# Patient Record
Sex: Female | Born: 1960 | Race: Black or African American | Hispanic: No | Marital: Married | Smoking: Never smoker
Health system: Southern US, Community
[De-identification: ages and names within clinical notes are randomized; demographics above are authoritative.]

## PROBLEM LIST (undated history)

## (undated) ENCOUNTER — Ambulatory Visit: Admission: EM | Payer: Managed Care, Other (non HMO) | Source: Home / Self Care

## (undated) DIAGNOSIS — I10 Essential (primary) hypertension: Secondary | ICD-10-CM

## (undated) DIAGNOSIS — M5430 Sciatica, unspecified side: Secondary | ICD-10-CM

## (undated) DIAGNOSIS — E559 Vitamin D deficiency, unspecified: Secondary | ICD-10-CM

## (undated) DIAGNOSIS — R768 Other specified abnormal immunological findings in serum: Secondary | ICD-10-CM

## (undated) DIAGNOSIS — D219 Benign neoplasm of connective and other soft tissue, unspecified: Secondary | ICD-10-CM

## (undated) DIAGNOSIS — R7689 Other specified abnormal immunological findings in serum: Secondary | ICD-10-CM

## (undated) DIAGNOSIS — M1990 Unspecified osteoarthritis, unspecified site: Secondary | ICD-10-CM

## (undated) DIAGNOSIS — E785 Hyperlipidemia, unspecified: Secondary | ICD-10-CM

## (undated) DIAGNOSIS — S82002A Unspecified fracture of left patella, initial encounter for closed fracture: Secondary | ICD-10-CM

## (undated) HISTORY — DX: Unspecified fracture of left patella, initial encounter for closed fracture: S82.002A

## (undated) HISTORY — DX: Sciatica, unspecified side: M54.30

## (undated) HISTORY — DX: Unspecified osteoarthritis, unspecified site: M19.90

## (undated) HISTORY — DX: Vitamin D deficiency, unspecified: E55.9

## (undated) HISTORY — DX: Other specified abnormal immunological findings in serum: R76.8

## (undated) HISTORY — DX: Other specified abnormal immunological findings in serum: R76.89

## (undated) HISTORY — DX: Hyperlipidemia, unspecified: E78.5

## (undated) HISTORY — DX: Benign neoplasm of connective and other soft tissue, unspecified: D21.9

## (undated) HISTORY — DX: Essential (primary) hypertension: I10

## (undated) HISTORY — PX: OTHER SURGICAL HISTORY: SHX169

---

## 1982-12-18 HISTORY — PX: TUBAL LIGATION: SHX77

## 2000-12-18 HISTORY — PX: ABDOMINAL HYSTERECTOMY: SHX81

## 2013-12-18 HISTORY — PX: CHOLECYSTECTOMY: SHX55

## 2014-07-21 ENCOUNTER — Ambulatory Visit: Payer: Self-pay | Admitting: Adult Health

## 2015-07-26 ENCOUNTER — Ambulatory Visit: Payer: Self-pay | Admitting: Cardiovascular Disease

## 2015-07-26 ENCOUNTER — Encounter: Payer: Self-pay | Admitting: *Deleted

## 2015-11-10 ENCOUNTER — Ambulatory Visit: Payer: Self-pay | Admitting: Primary Care

## 2015-11-17 ENCOUNTER — Ambulatory Visit: Payer: Self-pay | Admitting: Primary Care

## 2015-11-22 ENCOUNTER — Encounter: Payer: Self-pay | Admitting: Primary Care

## 2015-11-22 ENCOUNTER — Ambulatory Visit (INDEPENDENT_AMBULATORY_CARE_PROVIDER_SITE_OTHER): Payer: Managed Care, Other (non HMO) | Admitting: Primary Care

## 2015-11-22 VITALS — BP 126/82 | HR 66 | Temp 97.8°F | Ht 65.0 in | Wt 160.4 lb

## 2015-11-22 DIAGNOSIS — J209 Acute bronchitis, unspecified: Secondary | ICD-10-CM | POA: Diagnosis not present

## 2015-11-22 DIAGNOSIS — I10 Essential (primary) hypertension: Secondary | ICD-10-CM | POA: Diagnosis not present

## 2015-11-22 MED ORDER — DOXYCYCLINE HYCLATE 100 MG PO TABS: 100.0000 mg | ORAL_TABLET | Freq: Two times a day (BID) | ORAL | Status: DC

## 2015-11-22 NOTE — Assessment & Plan Note (Signed)
Diagnosed years ago and currently managed on lisinopril 5 mg. Cough today which is likely due to bacterial process. Will continue to monitor. BP stable in clinic today. Will obtain records for BMP.

## 2015-11-22 NOTE — Progress Notes (Signed)
Pre visit review using our clinic review tool, if applicable. No additional management support is needed unless otherwise documented below in the visit note. 

## 2015-11-22 NOTE — Patient Instructions (Signed)
Start Doxycycline antibiotics. Take 1 tablet by mouth twice daily for 10 days.  Continue cough medication, Mucinex. Rest and ensure you are staying hydrated.  Please call me if you've not noticed any improvement in 5 days.  Please schedule a physical with me after the new year at your convenience. You will also schedule a lab only appointment one week prior. We will discuss your lab results during your physical.  It was a pleasure to meet you today! Please don't hesitate to call me with any questions. Welcome to [facility]!

## 2015-11-22 NOTE — Progress Notes (Signed)
Subjective:    Patient ID: Samantha Hall, female    DOB: 13-Jan-1961, 54 y.o.   MRN: [id number]  HPI  [name] is a 54 year old female who presents today to establish care and discuss the problems mentioned below. Will obtain old records. Her last physical was in late winter/ early spring of 2016.  1) Cough: Present for the past 2 weeks with sore throat, fatigue, and voice hoarseness. She visited the employee clinic Tuesday last week and was provided with cough syrup with codeine. She was then started on prednisone taper on Thursday last week as she had no improvement. She's been taking Mucinex DM and Delsym with temporary relief. She continues to experience congestion and hoarseness to her voice with fatigue. Overall she's feeling worse.  2) Essential Hypertension: Diagnosed 7-8 years ago. Currently managed on lisinopril 5 mg. Denies headaches, chest pain. She does have a cough with other symptoms of fatigue, voice hoarseness.   3) Elevated ANA: History of elevated ANA in the past. Negative work up for Lupus and other autoimmune diseases in the past.   Review of Systems  Constitutional: Positive for fatigue. Negative for fever and chills.  HENT: Positive for congestion, rhinorrhea, sore throat and voice change. Negative for ear pain.   Respiratory: Positive for cough. Negative for shortness of breath.   Cardiovascular: Negative for chest pain.  Gastrointestinal: Negative for nausea and vomiting.  Genitourinary:       Hysterectomy  Musculoskeletal: Negative for myalgias and arthralgias.  Allergic/Immunologic: Negative for environmental allergies.  Neurological: Negative for dizziness and headaches.       Occasional sciatic nerve pain to right side.  Hematological: Negative for adenopathy.       Past Medical History  Diagnosis Date  . Hypertension   . Sciatica     Social History   Social History  . Marital Status: Married    Spouse Name: N/A  . Number of Children: N/A    . Years of Education: N/A   Occupational History  . Not on file.   Social History Main Topics  . Smoking status: Never Smoker   . Smokeless tobacco: Not on file  . Alcohol Use: 0.0 oz/week    0 Standard drinks or equivalent per week     Comment: sociall  . Drug Use: Not on file  . Sexual Activity: Not on file   Other Topics Concern  . Not on file   Social History Narrative   Married.   Highest level of education Bachelors.    Works as a Education officer, museum.    Past Surgical History  Procedure Laterality Date  . Cholecystectomy  2015  . Abdominal hysterectomy  2002  . Tubal ligation  1984    No family history on file.  Allergies  Allergen Reactions  . Cefazolin Swelling    Difficulty breathing    No current outpatient prescriptions on file prior to visit.   No current facility-administered medications on file prior to visit.    BP 126/82 mmHg  Pulse 66  Temp(Src) 97.8 F (36.6 C) (Oral)  Ht 5\' 5"  (1.651 m)  Wt 160 lb 6.4 oz (72.757 kg)  BMI 26.69 kg/m2  SpO2 97%    Objective:   Physical Exam  Constitutional: She is oriented to person, place, and time. She appears well-nourished.  HENT:  Right Ear: Tympanic membrane and ear canal normal.  Left Ear: Tympanic membrane and ear canal normal.  Nose: Right sinus exhibits no maxillary sinus tenderness  and no frontal sinus tenderness. Left sinus exhibits no maxillary sinus tenderness and no frontal sinus tenderness.  Mouth/Throat: Posterior oropharyngeal erythema present. No oropharyngeal exudate or posterior oropharyngeal edema.  Eyes: Conjunctivae are normal. Pupils are equal, round, and reactive to light.  Neck: Neck supple.  Cardiovascular: Normal rate and regular rhythm.   Pulmonary/Chest: Effort normal and breath sounds normal. She has no rales.  Dry, cough present on exam.  Lymphadenopathy:    She has no cervical adenopathy.  Neurological: She is alert and oriented to person, place, and time.  Skin: Skin  is warm and dry.  Psychiatric: She has a normal mood and affect.          Assessment & Plan:  Cough:  Sore throat, fatigue, chest congestion x 2 weeks. Temporary improvement with OTCs and codeine cough medication.  Recently treated with prednisone, no improvement. Suspect bacterial involvement at this point and will treat with antibiotics. Start doxycycline course. Fluids, rest, Delsym. If no improvement will consider switching from ACE.

## 2015-11-23 ENCOUNTER — Encounter: Payer: Self-pay | Admitting: Primary Care

## 2015-11-29 ENCOUNTER — Encounter: Payer: Self-pay | Admitting: Primary Care

## 2015-12-30 ENCOUNTER — Telehealth: Payer: Self-pay | Admitting: Primary Care

## 2015-12-30 ENCOUNTER — Emergency Department
Admission: EM | Admit: 2015-12-30 | Discharge: 2015-12-30 | Disposition: A | Discharge status: Home / Self Care | Payer: Managed Care, Other (non HMO) | Attending: Emergency Medicine | Admitting: Emergency Medicine

## 2015-12-30 ENCOUNTER — Encounter: Payer: Self-pay | Admitting: *Deleted

## 2015-12-30 DIAGNOSIS — Z792 Long term (current) use of antibiotics: Secondary | ICD-10-CM | POA: Diagnosis not present

## 2015-12-30 DIAGNOSIS — R1011 Right upper quadrant pain: Secondary | ICD-10-CM | POA: Diagnosis present

## 2015-12-30 DIAGNOSIS — K2980 Duodenitis without bleeding: Secondary | ICD-10-CM | POA: Diagnosis not present

## 2015-12-30 DIAGNOSIS — K2970 Gastritis, unspecified, without bleeding: Secondary | ICD-10-CM | POA: Insufficient documentation

## 2015-12-30 DIAGNOSIS — Z79899 Other long term (current) drug therapy: Secondary | ICD-10-CM | POA: Diagnosis not present

## 2015-12-30 DIAGNOSIS — I10 Essential (primary) hypertension: Secondary | ICD-10-CM | POA: Diagnosis not present

## 2015-12-30 DIAGNOSIS — K2990 Gastroduodenitis, unspecified, without bleeding: Secondary | ICD-10-CM

## 2015-12-30 LAB — URINALYSIS COMPLETE WITH MICROSCOPIC (ARMC ONLY)
Bilirubin Urine: NEGATIVE
Glucose, UA: NEGATIVE mg/dL
Hgb urine dipstick: NEGATIVE
Ketones, ur: NEGATIVE mg/dL
Leukocytes, UA: NEGATIVE
Nitrite: NEGATIVE
Protein, ur: NEGATIVE mg/dL
Specific Gravity, Urine: 1.006 (ref 1.005–1.030)
pH: 7 (ref 5.0–8.0)

## 2015-12-30 LAB — COMPREHENSIVE METABOLIC PANEL
ALT: 34 U/L (ref 14–54)
AST: 30 U/L (ref 15–41)
Albumin: 4 g/dL (ref 3.5–5.0)
Alkaline Phosphatase: 59 U/L (ref 38–126)
Anion gap: 6 (ref 5–15)
BUN: 13 mg/dL (ref 6–20)
CO2: 25 mmol/L (ref 22–32)
Calcium: 8.8 mg/dL — ABNORMAL LOW (ref 8.9–10.3)
Chloride: 106 mmol/L (ref 101–111)
Creatinine, Ser: 0.8 mg/dL (ref 0.44–1.00)
GFR calc Af Amer: >60 mL/min (ref >60)
GFR calc non Af Amer: >60 mL/min (ref >60)
Glucose, Bld: 95 mg/dL (ref 65–99)
Potassium: 3.9 mmol/L (ref 3.5–5.1)
Sodium: 137 mmol/L (ref 135–145)
Total Bilirubin: 0.8 mg/dL (ref 0.3–1.2)
Total Protein: 7.3 g/dL (ref 6.5–8.1)

## 2015-12-30 LAB — LIPASE, BLOOD: Lipase: 23 U/L (ref 11–51)

## 2015-12-30 LAB — CBC
HCT: 42.7 % (ref 35.0–47.0)
Hemoglobin: 14.4 g/dL (ref 12.0–16.0)
MCH: 30.8 pg (ref 26.0–34.0)
MCHC: 33.7 g/dL (ref 32.0–36.0)
MCV: 91.4 fL (ref 80.0–100.0)
Platelets: 219 10*3/uL (ref 150–440)
RBC: 4.67 MIL/uL (ref 3.80–5.20)
RDW: 13.1 % (ref 11.5–14.5)
WBC: 7.8 10*3/uL (ref 3.6–11.0)

## 2015-12-30 MED ORDER — DICYCLOMINE HCL 20 MG PO TABS: 20.0000 mg | ORAL_TABLET | Freq: Three times a day (TID) | ORAL | Status: DC | PRN

## 2015-12-30 MED ORDER — GI COCKTAIL ~~LOC~~
ORAL | Status: AC
  Administered 2015-12-30: 30 mL via ORAL
  Filled 2015-12-30: qty 30

## 2015-12-30 MED ORDER — GI COCKTAIL ~~LOC~~
30.0000 mL | Freq: Once | ORAL | Status: AC
  Administered 2015-12-30: 30 mL via ORAL
  Filled 2015-12-30: qty 30

## 2015-12-30 MED ORDER — PANTOPRAZOLE SODIUM 20 MG PO TBEC: 20.0000 mg | DELAYED_RELEASE_TABLET | Freq: Every day | ORAL | Status: DC

## 2015-12-30 MED ORDER — ONDANSETRON HCL 4 MG PO TABS: 4.0000 mg | ORAL_TABLET | Freq: Three times a day (TID) | ORAL | Status: DC | PRN

## 2015-12-30 NOTE — ED Notes (Signed)
States umbilical pain that started last night, states nausea, states lower back pain, denies any painful urination, states she has not eaten today

## 2015-12-30 NOTE — ED Provider Notes (Signed)
Time Seen: Approximately 1500 I have reviewed the triage notes  Chief Complaint: Abdominal Pain   History of Present Illness: Samantha Hall is a 55 y.o. female *who states that she had some pain in the abdomen that started yesterday and points primarily to the epigastric area she states the pain seems to radiate to the right upper quadrant and occasionally through to her back and denies any melena or hematochezia. She denies any fever at home. She did not take any medications prior to arrival. Patient she had 2 bowel movements today, one was normal and the other one was somewhat loose and watery. She does describe nausea without any persistent vomiting.   Past Medical History  Diagnosis Date  . Hypertension   . Sciatica   . Elevated antinuclear antibody (ANA) level     Negative workup for Lupus.    Patient Active Problem List   Diagnosis Date Noted  . Essential hypertension 11/22/2015    Past Surgical History  Procedure Laterality Date  . Cholecystectomy  2015  . Abdominal hysterectomy  2002  . Tubal ligation  1984    Past Surgical History  Procedure Laterality Date  . Cholecystectomy  2015  . Abdominal hysterectomy  2002  . Tubal ligation  1984    Current Outpatient Rx  Name  Route  Sig  Dispense  Refill  . dicyclomine (BENTYL) 20 MG tablet   Oral   Take 1 tablet (20 mg total) by mouth 3 (three) times daily as needed for spasms.   30 tablet   0   . doxycycline (VIBRA-TABS) 100 MG tablet   Oral   Take 1 tablet (100 mg total) by mouth 2 (two) times daily.   20 tablet   0   . lisinopril (PRINIVIL,ZESTRIL) 5 MG tablet   Oral   Take 5 mg by mouth daily.         . ondansetron (ZOFRAN) 4 MG tablet   Oral   Take 1 tablet (4 mg total) by mouth every 8 (eight) hours as needed for nausea or vomiting.   21 tablet   0   . pantoprazole (PROTONIX) 20 MG tablet   Oral   Take 1 tablet (20 mg total) by mouth daily.   30 tablet   1     Allergies:  Cefazolin  and Sulfa antibiotics  Family History: History reviewed. No pertinent family history.  Social History: Social History  Substance Use Topics  . Smoking status: Never Smoker   . Smokeless tobacco: None  . Alcohol Use: 0.0 oz/week    0 Standard drinks or equivalent per week     Comment: sociall     Review of Systems:   10 point review of systems was performed and was otherwise negative:  Constitutional: No fever Eyes: No visual disturbances ENT: No sore throat, ear pain Cardiac: No chest pain Respiratory: No shortness of breath, wheezing, or stridor Abdomen: Patient points primarily to the epigastric area Endocrine: No weight loss, No night sweats Extremities: No peripheral edema, cyanosis Skin: No rashes, easy bruising Neurologic: No focal weakness, trouble with speech or swollowing Urologic: No dysuria, Hematuria, or urinary frequency   Physical Exam:  ED Triage Vitals  Enc Vitals Group     BP 12/30/15 1233 163/83 mmHg     Pulse Rate 12/30/15 1233 66     Resp 12/30/15 1233 16     Temp 12/30/15 1233 98.6 F (37 C)     Temp Source 12/30/15 1233 Oral  SpO2 12/30/15 1233 97 %     Weight 12/30/15 1233 150 lb (68.04 kg)     Height 12/30/15 1233 5\' 5"  (1.651 m)     Head Cir --      Peak Flow --      Pain Score 12/30/15 1238 4     Pain Loc --      Pain Edu? --      Excl. in [place]? --     General: Awake , Alert , and Oriented times 3; GCS 15 Head: Normal cephalic , atraumatic Eyes: Pupils equal , round, reactive to light Nose/Throat: No nasal drainage, patent upper airway without erythema or exudate.  Neck: Supple, Full range of motion, No anterior adenopathy or palpable thyroid masses Lungs: Clear to ascultation without wheezes , rhonchi, or rales Heart: Regular rate, regular rhythm without murmurs , gallops , or rubs Abdomen: Patient's tender to deep palpation in the epigastric area without rebound, guarding , or rigidity; bowel sounds positive and symmetric in  all 4 quadrants. No organomegaly .      No reproducible lower abdominal pain. Negative tenderness over McBurney's point  Extremities: 2 plus symmetric pulses. No edema, clubbing or cyanosis Neurologic: normal ambulation, Motor symmetric without deficits, sensory intact Skin: warm, dry, no rashes   Labs:   All laboratory work was reviewed including any pertinent negatives or positives listed below:  [place] - Abnormal; Notable for the following:    Calcium 8.8 (*)    All other components within normal limits  URINALYSIS COMPLETEWITH MICROSCOPIC ([facility] ONLY) - Abnormal; Notable for the following:    Color, Urine STRAW (*)    APPearance CLEAR (*)    Bacteria, UA FEW (*)    Squamous Epithelial / LPF 0-5 (*)    All other components within normal limits  LIPASE, BLOOD  CBC   laboratory work showed no significant findings  EKG: ED ECG REPORT I, Daymon Larsen, the attending physician, personally viewed and interpreted this ECG.  Date: 12/30/2015 EKG Time: *1245* Rate: 64 Rhythm: normal sinus rhythm QRS Axis: normal Intervals: normal ST/T Wave abnormalities: Nonspecific T wave abnormality Conduction Disutrbances: none Narrative Interpretation: unremarkable No acute ischemic changes      ED Course:  Patient was given a GI cocktail with symptomatic improvement.Differential diagnosis includes but is not exclusive to acute cholecystitis, intrathoracic causes for epigastric abdominal pain, gastritis, duodenitis, pancreatitis, small bowel or large bowel obstruction, abdominal aortic aneurysm, hernia, gastritis, etc.   Given the patient's current clinical presentation and objective findings I felt most likely this was gastritis or duodenitis. Patient goes on to state that she does have a history of hiatal hernia. She's had a previous cholecystectomy but I felt this was unlikely to be a bowel obstruction or surgical issue at this  time. Assessment: Acute gastritis   Final Clinical Impression  Final diagnoses:  Gastritis and duodenitis     Plan:  Outpatient management Patient was advised to return immediately if condition worsens. Patient was advised to follow up with their primary care physician or other specialized physicians involved in their outpatient care Patient was discharged on proton next, Bentyl, and Zofran.            Daymon Larsen, MD 12/30/15 [phone]

## 2015-12-30 NOTE — Telephone Encounter (Signed)
PLEASE NOTE: All timestamps contained within this report are represented as [place] Standard Time. CONFIDENTIALTY NOTICE: This fax transmission is intended only for the addressee. It contains information that is legally privileged, confidential or otherwise protected from use or disclosure. If you are not the intended recipient, you are strictly prohibited from reviewing, disclosing, copying using or disseminating any of this information or taking any action in reliance on or regarding this information. If you have received this fax in error, please notify us immediately by telephone so that we can arrange for its return to [place]. Phone: [phone], Toll-Free: [phone], Fax: [phone] Page: 1 of 1 Call Id: [id number] [place] Patient Name: Samantha Hall DOB: Apr 28, 1961 Initial Comment caller states she has upper abd pain Nurse Assessment Nurse: Marcelline Deist, RN, Kermit Balo Date/Time (Eastern Time): 12/30/2015 10:26:10 AM Confirm and document reason for call. If symptomatic, describe symptoms. ---Caller states she has upper abdominal pain, goes from center to the right side. It started last night. Feels cold, not sure if fever. Does not have gall bladder. Has the patient traveled out of the country within the last 30 days? ---Not Applicable Does the patient have any new or worsening symptoms? ---Yes Will a triage be completed? ---Yes Related visit to physician within the last 2 weeks? ---No Does the PT have any chronic conditions? (i.e. diabetes, asthma, etc.) ---Yes List chronic conditions. --- takes Lisinopril Is this a behavioral health or substance abuse call? ---No Guidelines Guideline Title Affirmed Question Affirmed Notes Abdominal Pain - Upper [1] Pain lasts > 10 minutes AND [2] age > 79 Final Disposition User Go to ED Now Marcelline Deist, RN, Lynda Comments Caller states she can get someone  to take her to the hospital. The pain comes and goes in intensity, states it can get sharp, too. Rates it a 4 or 5 on pain scale. Referrals [facility] - ED Disagree/Comply: Comply

## 2015-12-30 NOTE — Discharge Instructions (Signed)
Gastritis, Adult Gastritis is soreness and swelling (inflammation) of the lining of the stomach. Gastritis can develop as a sudden onset (acute) or long-term (chronic) condition. If gastritis is not treated, it can lead to stomach bleeding and ulcers. CAUSES  Gastritis occurs when the stomach lining is weak or damaged. Digestive juices from the stomach then inflame the weakened stomach lining. The stomach lining may be weak or damaged due to viral or bacterial infections. One common bacterial infection is the Helicobacter pylori infection. Gastritis can also result from excessive alcohol consumption, taking certain medicines, or having too much acid in the stomach.  SYMPTOMS  In some cases, there are no symptoms. When symptoms are present, they may include:  Pain or a burning sensation in the upper abdomen.  Nausea.  Vomiting.  An uncomfortable feeling of fullness after eating. DIAGNOSIS  Your caregiver may suspect you have gastritis based on your symptoms and a physical exam. To determine the cause of your gastritis, your caregiver may perform the following:  Blood or stool tests to check for the H pylori bacterium.  Gastroscopy. A thin, flexible tube (endoscope) is passed down the esophagus and into the stomach. The endoscope has a light and camera on the end. Your caregiver uses the endoscope to view the inside of the stomach.  Taking a tissue sample (biopsy) from the stomach to examine under a microscope. TREATMENT  Depending on the cause of your gastritis, medicines may be prescribed. If you have a bacterial infection, such as an H pylori infection, antibiotics may be given. If your gastritis is caused by too much acid in the stomach, H2 blockers or antacids may be given. Your caregiver may recommend that you stop taking aspirin, ibuprofen, or other nonsteroidal anti-inflammatory drugs (NSAIDs). HOME CARE INSTRUCTIONS  Only take over-the-counter or prescription medicines as directed by  your caregiver.  If you were given antibiotic medicines, take them as directed. Finish them even if you start to feel better.  Drink enough fluids to keep your urine clear or pale yellow.  Avoid foods and drinks that make your symptoms worse, such as:  Caffeine or alcoholic drinks.  Chocolate.  Peppermint or mint flavorings.  Garlic and onions.  Spicy foods.  Citrus fruits, such as oranges, lemons, or limes.  Tomato-based foods such as sauce, chili, salsa, and pizza.  Fried and fatty foods.  Eat small, frequent meals instead of large meals. SEEK IMMEDIATE MEDICAL CARE IF:   You have black or dark red stools.  You vomit blood or material that looks like coffee grounds.  You are unable to keep fluids down.  Your abdominal pain gets worse.  You have a fever.  You do not feel better after 1 week.  You have any other questions or concerns. MAKE SURE YOU:  Understand these instructions.  Will watch your condition.  Will get help right away if you are not doing well or get worse.   This information is not intended to replace advice given to you by your health care provider. Make sure you discuss any questions you have with your health care provider.   Document Released: 11/28/2001 Document Revised: 06/04/2012 Document Reviewed: 01/17/2012 Elsevier Interactive Patient Education [employer].  Please return immediately if condition worsens. Please contact her primary physician or the physician you were given for referral. If you have any specialist physicians involved in her treatment and plan please also contact them. Thank you for using [facility] [place] [facility]. Please return especially for fever, persistent  vomiting, shortness of breath, bloody stool, or any other new concerns.

## 2016-01-04 ENCOUNTER — Encounter: Payer: Self-pay | Admitting: Primary Care

## 2016-01-04 ENCOUNTER — Ambulatory Visit (INDEPENDENT_AMBULATORY_CARE_PROVIDER_SITE_OTHER): Payer: Managed Care, Other (non HMO) | Admitting: Primary Care

## 2016-01-04 VITALS — BP 124/82 | HR 54 | Temp 97.9°F | Ht 65.0 in | Wt 160.8 lb

## 2016-01-04 DIAGNOSIS — R1010 Upper abdominal pain, unspecified: Secondary | ICD-10-CM

## 2016-01-04 DIAGNOSIS — I10 Essential (primary) hypertension: Secondary | ICD-10-CM | POA: Diagnosis not present

## 2016-01-04 NOTE — Patient Instructions (Signed)
Hold the Lisinopril 5 mg for 2 weeks. Check your blood pressure daily, around the same time of day, for the next 2 weeks.   Ensure that you have rested for 30 minutes prior to checking your blood pressure. Record your readings and send them to me via My Chart.  Stop by the front desk and speak with either Rosaria Ferries or Ebony Hail regarding your Ultrasound. I will be in touch regarding your results.  Please e-mail me if your pain becomes worse between now and your Ultrasound.  It was a pleasure to see you today!

## 2016-01-04 NOTE — Progress Notes (Signed)
Pre visit review using our clinic review tool, if applicable. No additional management support is needed unless otherwise documented below in the visit note. 

## 2016-01-04 NOTE — Progress Notes (Signed)
Subjective:    Patient ID: Samantha Hall, female    DOB: 13-Jan-1961, 55 y.o.   MRN: [id number]  HPI  Samantha Hall is a 55 year old female who presents today for Emergency Department follow up. Recently evaluated in the ED for complaints of abdominal pain. Her pain was located to epigastric region with radiation to RUQ. She was suspected to have gastritis or duodenitis as she had a cholecystectomy and no evidence for bowel obstruction. She was provided with a GI cocktail with improvement. She was discharged home with a prescription for Protonix, Bentyl, and Zofran.  Since evaluation in the ED she continues to have some improvement overall but will experience discomfort. She's taking the Protonix daily. She's used the Zofran a few times. She's had 1 episode of nausea, no vomiting. Denies bloody stools, urinary symptoms, vaginal symptoms. She does feel "swollen" to the epigastric region.   2) Essential Hypertension: Currently managed on Lisinopril 5 mg. She's checking her BP at home very occasionally with normal readings. She will occasionally get a "tickle" to her throat with cough a coughing spell. She feels when she experiences this she cannot talk.  3) Left shoulder pain: Soreness to joint. Intermittently for the past several months. Doesn't require her to take any medications. Soreness is present with rest and movement.  Review of Systems  Constitutional: Negative for fever.  Respiratory: Negative for cough and shortness of breath.   Cardiovascular: Negative for chest pain.  Gastrointestinal: Positive for nausea and abdominal pain. Negative for vomiting, diarrhea, constipation and abdominal distention.  Genitourinary: Negative for dysuria and vaginal discharge.  Neurological: Negative for dizziness and headaches.       Past Medical History  Diagnosis Date  . Hypertension   . Sciatica   . Elevated antinuclear antibody (ANA) level     Negative workup for Lupus.    Social History     Social History  . Marital Status: Married    Spouse Name: N/A  . Number of Children: N/A  . Years of Education: N/A   Occupational History  . Not on file.   Social History Main Topics  . Smoking status: Never Smoker   . Smokeless tobacco: Not on file  . Alcohol Use: 0.0 oz/week    0 Standard drinks or equivalent per week     Comment: sociall  . Drug Use: Not on file  . Sexual Activity: Not on file   Other Topics Concern  . Not on file   Social History Narrative   Married.   Highest level of education Bachelors.    Works as a Education officer, museum.    Past Surgical History  Procedure Laterality Date  . Cholecystectomy  2015  . Abdominal hysterectomy  2002  . Tubal ligation  1984    No family history on file.  Allergies  Allergen Reactions  . Cefazolin Swelling    Difficulty breathing  . Sulfa Antibiotics     Current Outpatient Prescriptions on File Prior to Visit  Medication Sig Dispense Refill  . dicyclomine (BENTYL) 20 MG tablet Take 1 tablet (20 mg total) by mouth 3 (three) times daily as needed for spasms. 30 tablet 0  . lisinopril (PRINIVIL,ZESTRIL) 5 MG tablet Take 5 mg by mouth daily.    . ondansetron (ZOFRAN) 4 MG tablet Take 1 tablet (4 mg total) by mouth every 8 (eight) hours as needed for nausea or vomiting. 21 tablet 0  . pantoprazole (PROTONIX) 20 MG tablet Take 1 tablet (20 mg  total) by mouth daily. 30 tablet 1   No current facility-administered medications on file prior to visit.    BP 124/82 mmHg  Pulse 54  Temp(Src) 97.9 F (36.6 C) (Oral)  Ht 5\' 5"  (1.651 m)  Wt 160 lb 12.8 oz (72.938 kg)  BMI 26.76 kg/m2  SpO2 99%    Objective:   Physical Exam  Constitutional: She appears well-nourished.  Cardiovascular: Normal rate and regular rhythm.   Pulmonary/Chest: Effort normal and breath sounds normal.  Abdominal: Soft. Bowel sounds are normal. There is tenderness in the right upper quadrant and epigastric area.  Skin: Skin is warm and dry.           Assessment & Plan:  ED Follow up:  Epigastric and RUQ abdominal pain x several days. Continues despite treatment with bentyl, zofran, protonix. History of hiatal hernia and cholecystomy, tender to epigastric region on exam. Labs and notes from ED reviewed. Labs look good.  Will complete US to RUQ as she continued to experience discomfort.

## 2016-01-05 NOTE — Assessment & Plan Note (Signed)
Stable today. She's not had her medication this morning. Will hold as she's experiencing coughing spells. She is to send me home BP readings in 2 weeks. If elevated then will switch to HCTZ or Amlodipine.

## 2016-01-06 ENCOUNTER — Encounter: Payer: Self-pay | Admitting: Primary Care

## 2016-01-06 ENCOUNTER — Ambulatory Visit
Admission: RE | Admit: 2016-01-06 | Discharge: 2016-01-06 | Disposition: A | Discharge status: Home / Self Care | Payer: Managed Care, Other (non HMO) | Source: Ambulatory Visit | Attending: Primary Care | Admitting: Primary Care

## 2016-01-06 DIAGNOSIS — R1013 Epigastric pain: Secondary | ICD-10-CM | POA: Insufficient documentation

## 2016-01-06 DIAGNOSIS — Z9049 Acquired absence of other specified parts of digestive tract: Secondary | ICD-10-CM | POA: Diagnosis not present

## 2016-01-06 DIAGNOSIS — R1010 Upper abdominal pain, unspecified: Secondary | ICD-10-CM

## 2016-01-06 DIAGNOSIS — R1011 Right upper quadrant pain: Secondary | ICD-10-CM | POA: Diagnosis present

## 2016-01-18 ENCOUNTER — Telehealth: Payer: Self-pay | Admitting: Primary Care

## 2016-01-18 NOTE — Telephone Encounter (Signed)
Called and asked patient regarding her BP. Patient stated that she was not checking her BP as she was suppose to. She does take a lisinopril when she believes her elevated BP.  BP Reading  136/84   145/97 94/64 124/86 132/98 133/74 111/78 134/68 124/80 129/81 146/88 126/82 148/88

## 2016-01-18 NOTE — Telephone Encounter (Signed)
Will you check on Samantha Hall blood pressure? We stopped her lisinopril last visit due to cough. How are her readings?

## 2016-01-31 IMAGING — US US ABDOMEN LIMITED
1 series · 14 of 25 positions shown · non-contrast
Comparison: None.

CLINICAL DATA: Epigastric pain, right upper quadrant pain for 8
days

EXAM:
US ABDOMEN LIMITED - RIGHT UPPER QUADRANT

[Series 1: us abdomen limited · 0.22mm/px · 14 of 73 slices shown]
[im 1/73]
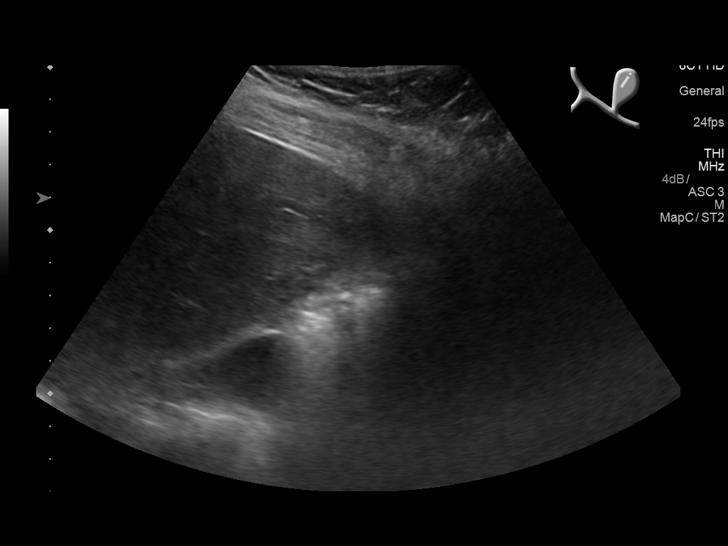
[im 7/73]
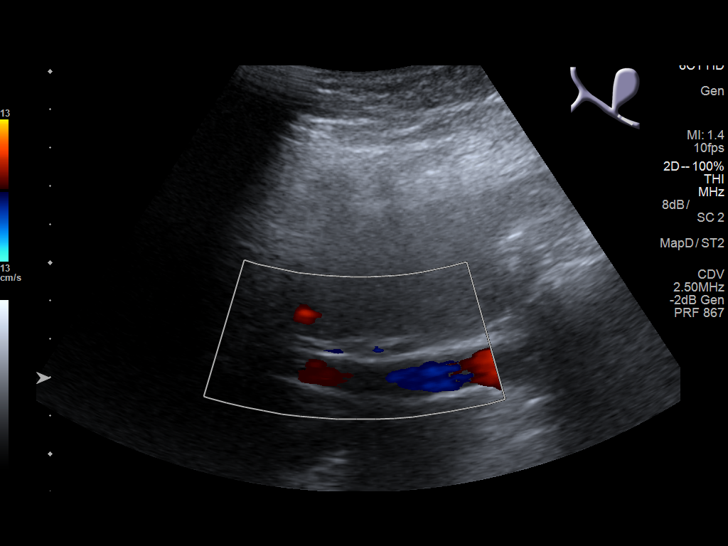
[im 13/73]
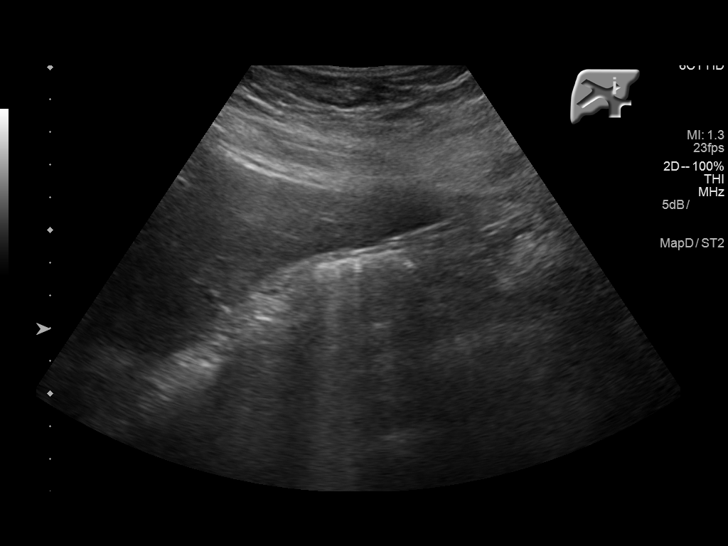
[im 19/73]
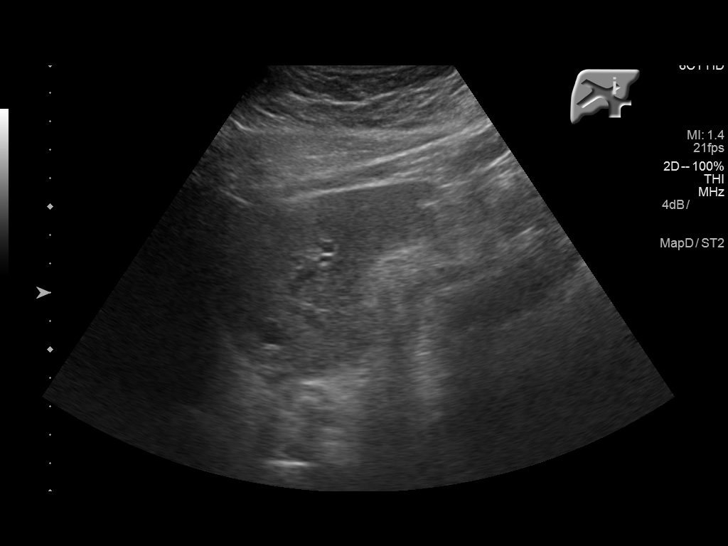
[im 25/73]
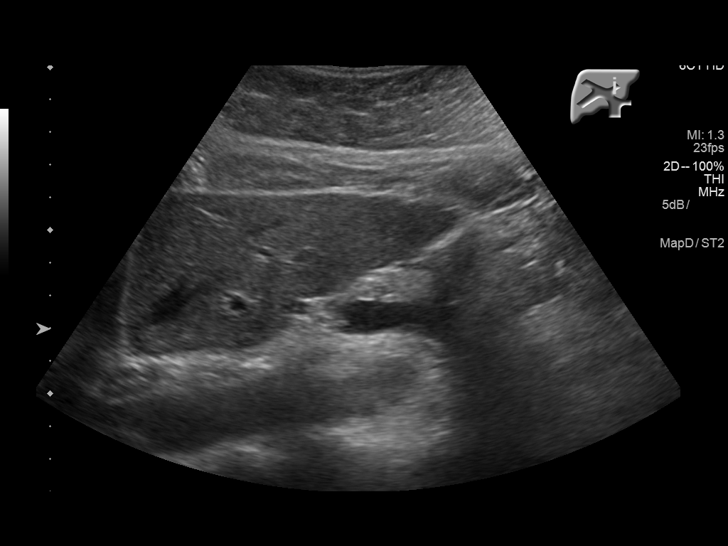
[im 28/73]
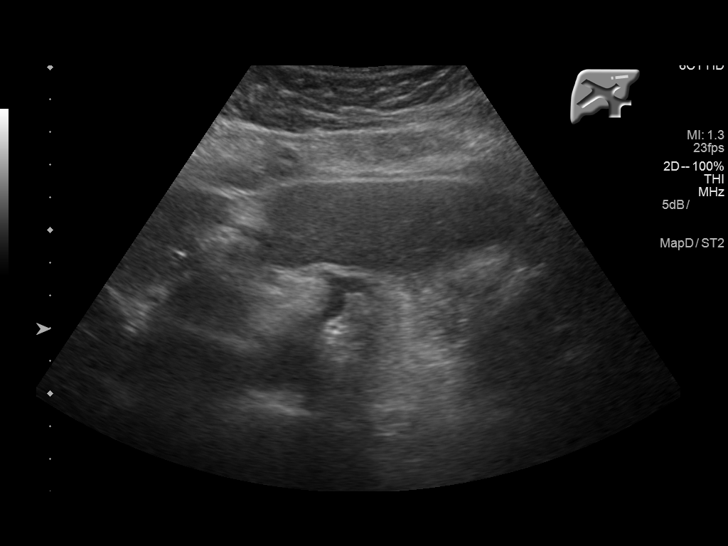
[im 34/73]
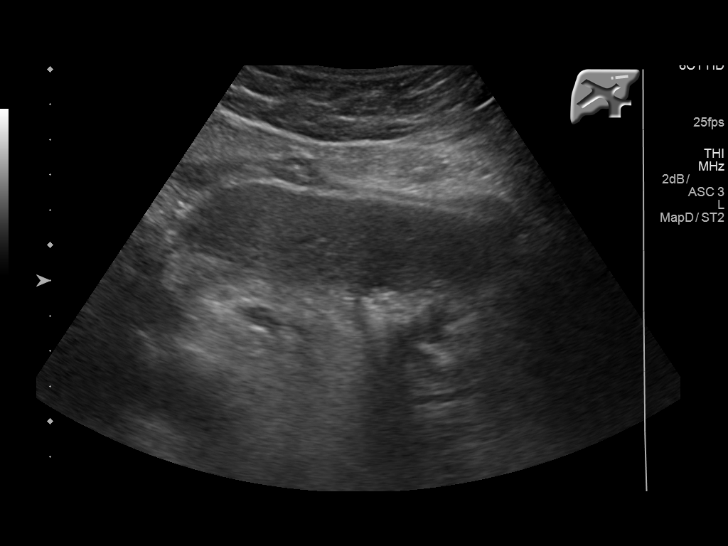
[im 40/73]
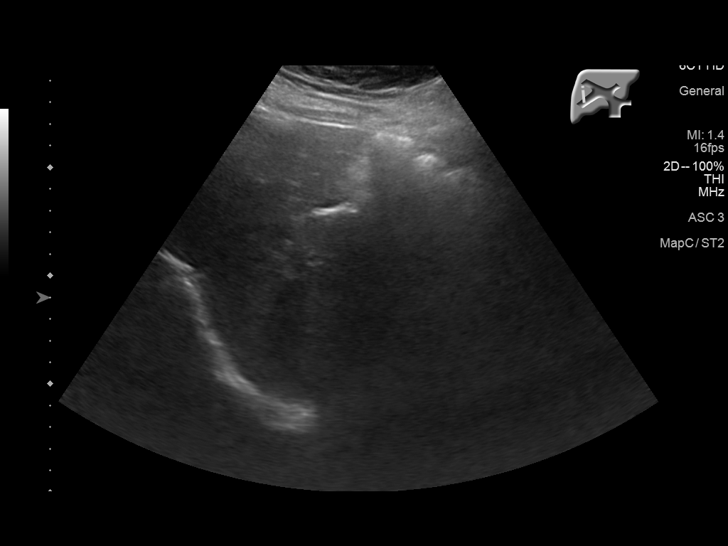
[im 46/73]
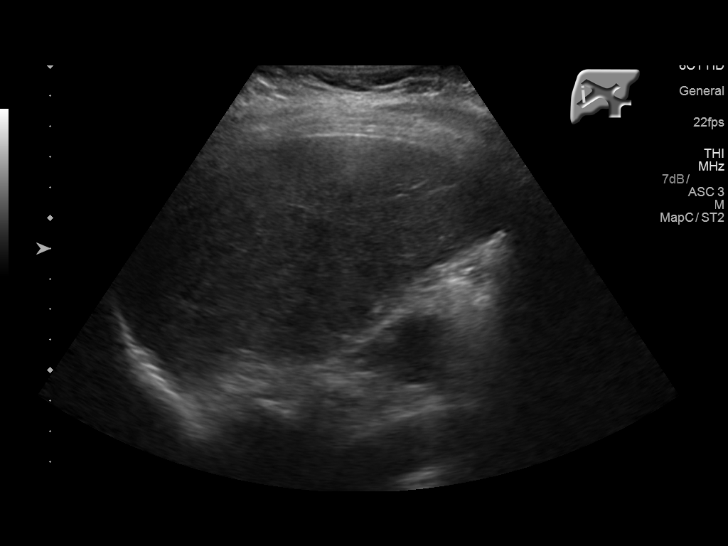
[im 49/73]
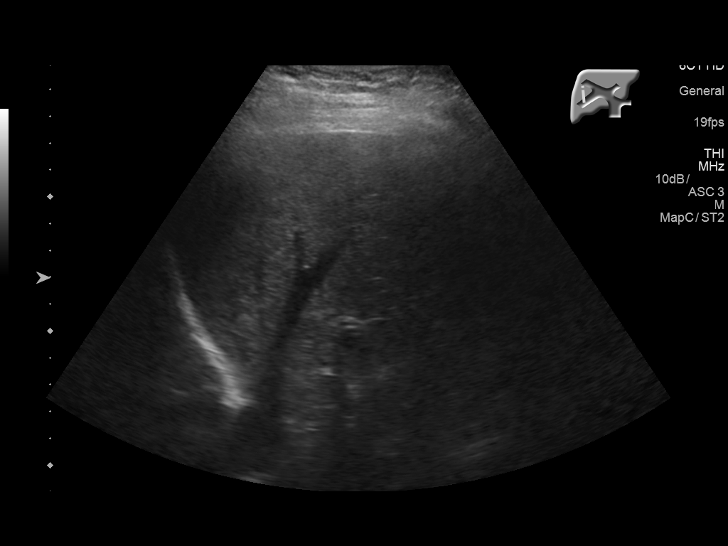
[im 55/73]
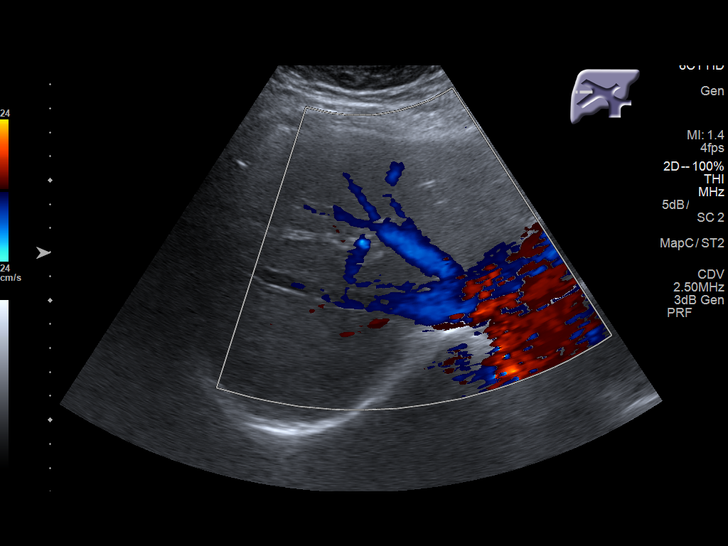
[im 61/73]
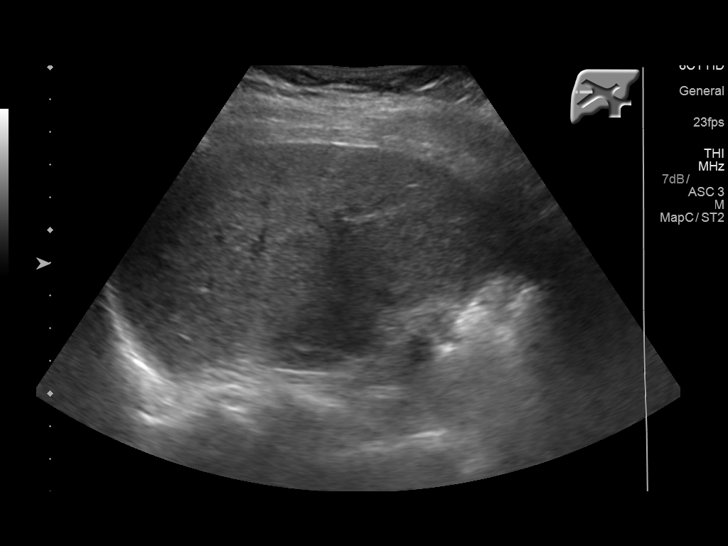
[im 67/73]
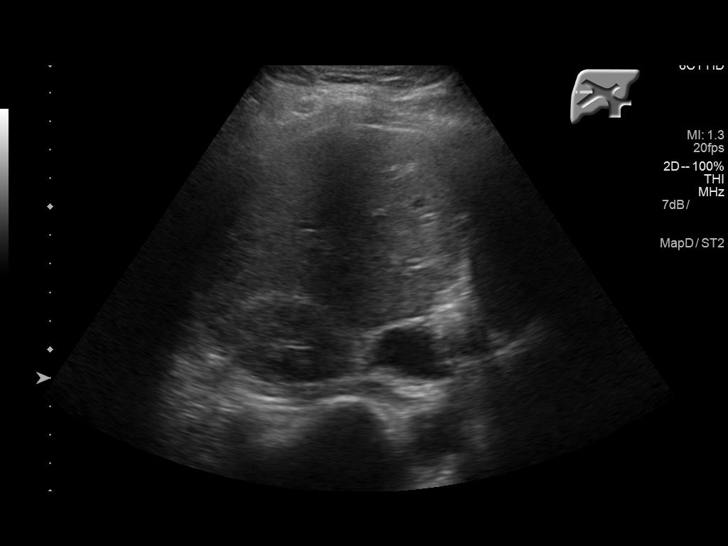
[im 73/73]
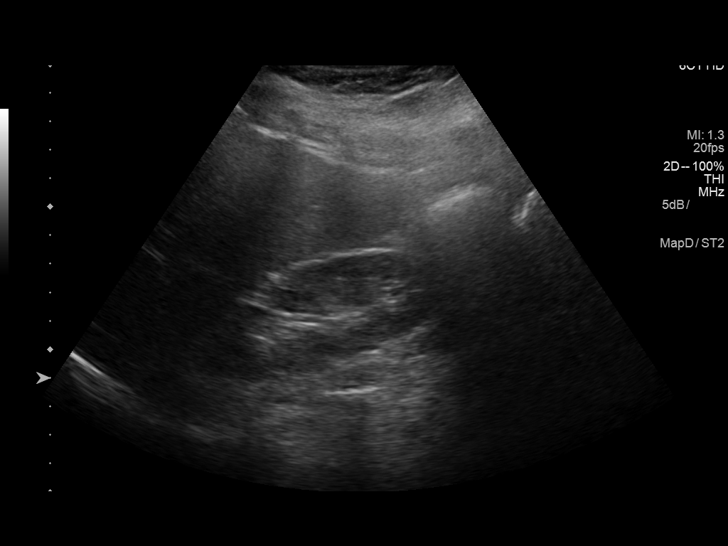

[14 of 25 positions shown; findings below may reference images not displayed]

FINDINGS: Gallbladder:

Surgically absent

Common bile duct:

Diameter: 4 mm in diameter within normal limits.

Liver:

No focal lesion identified. Mild increased echogenicity of the liver
suspicious for fatty infiltration.
IMPRESSION: 1. Surgically absent gallbladder.  Normal CBD.
2. Mild increased echogenicity of the liver suspicious for fatty
infiltration. No focal hepatic mass.

## 2016-02-06 ENCOUNTER — Encounter: Payer: Self-pay | Admitting: Primary Care

## 2016-02-07 ENCOUNTER — Other Ambulatory Visit: Payer: Self-pay | Admitting: Primary Care

## 2016-02-07 ENCOUNTER — Telehealth: Payer: Self-pay | Admitting: Primary Care

## 2016-02-07 DIAGNOSIS — I10 Essential (primary) hypertension: Secondary | ICD-10-CM

## 2016-02-07 MED ORDER — AMLODIPINE BESYLATE 5 MG PO TABS: 5.0000 mg | ORAL_TABLET | Freq: Every day | ORAL | Status: DC

## 2016-02-07 NOTE — Telephone Encounter (Signed)
I will need to see Samantha Hall back in the office in 2 weeks as I've added Amlodipine to her regimen. Would like to see her in 2 weeks if possible. Will you schedule? She it to check BP and bring readings to that appointment. Thanks.

## 2016-02-08 NOTE — Telephone Encounter (Signed)
Called and notified patient of Kate's comments. Patient is schedule for follow up on 02/25/2016

## 2016-02-22 ENCOUNTER — Encounter: Payer: Self-pay | Admitting: Primary Care

## 2016-02-23 ENCOUNTER — Encounter: Payer: Self-pay | Admitting: Primary Care

## 2016-02-25 ENCOUNTER — Ambulatory Visit: Payer: Managed Care, Other (non HMO) | Admitting: Primary Care

## 2016-03-08 ENCOUNTER — Encounter: Payer: Self-pay | Admitting: Primary Care

## 2016-05-09 ENCOUNTER — Other Ambulatory Visit: Payer: Self-pay | Admitting: Primary Care

## 2016-05-09 DIAGNOSIS — I10 Essential (primary) hypertension: Secondary | ICD-10-CM

## 2016-05-09 MED ORDER — AMLODIPINE BESYLATE 5 MG PO TABS: 5.0000 mg | ORAL_TABLET | Freq: Every day | ORAL | Status: DC

## 2016-05-09 NOTE — Telephone Encounter (Signed)
Received fax refill request for amlodipine 5 mg.   Last prescribed and seen on 02/07/2016. Sent Rx as requested.

## 2016-07-11 ENCOUNTER — Other Ambulatory Visit: Payer: Self-pay | Admitting: Primary Care

## 2016-07-11 NOTE — Telephone Encounter (Signed)
Electronically refill request for pantoprazole 20 mg. Medication was prescribed at ED on 12/30/2015. Last seen on 01/04/2016. No future appt.

## 2016-08-07 ENCOUNTER — Other Ambulatory Visit: Payer: Self-pay | Admitting: Primary Care

## 2016-09-02 ENCOUNTER — Ambulatory Visit: Payer: Managed Care, Other (non HMO) | Admitting: Family Medicine

## 2016-09-07 ENCOUNTER — Other Ambulatory Visit: Payer: Self-pay | Admitting: Primary Care

## 2016-09-07 NOTE — Telephone Encounter (Signed)
Ok to refill? Electronically refill request for   pantoprazole (PROTONIX) 20 MG tablet  Last prescribed on 08/07/2016. Last seen on 01/04/2016.   No future appt.

## 2016-09-08 ENCOUNTER — Telehealth: Payer: Self-pay | Admitting: Primary Care

## 2016-09-08 NOTE — Telephone Encounter (Signed)
Please call patient and ask why she's taking pantoprazole. What are her symptoms?  I received a refill request of this medication.

## 2016-09-08 NOTE — Telephone Encounter (Signed)
Message left for patient to return my call.  

## 2016-09-15 ENCOUNTER — Other Ambulatory Visit: Payer: Self-pay | Admitting: Primary Care

## 2016-09-15 NOTE — Telephone Encounter (Signed)
Recommend she try pepcid or zantac for occasional upset stomach. Has she tried this? Long term use of medications like pantoprazole can increase risk for bone density reduction. If this is the only thing that works for her then i'm happy to send in. It's just not very routinely given for occasional GI symptoms.

## 2016-09-15 NOTE — Telephone Encounter (Signed)
Message left for patient to return my call.  

## 2016-09-15 NOTE — Telephone Encounter (Signed)
Spoken to patient and she stated she does not take very often. It is when she gets an upset stomach and sometimes nausea because of it. Patient ask if Samantha Hall can refill just it case she may need it.

## 2016-09-20 NOTE — Telephone Encounter (Signed)
Spoken and notified patient of Kate's comments. Patient verbalized understanding.  Patient will try Pepcid or Zantac. Patient stated that she will like to get her physical done but would like the labs done at her job. Would that be okay? What labs would you like patient to get?  Patient stated we may send a message through [facility].

## 2016-09-21 NOTE — Telephone Encounter (Signed)
Please schedule her for her physical at her convenience. Yes, she may get her labs done through her occupation. I would like a CMP, A1C, Lipid Panel, Vitamin D level. Thanks.

## 2016-09-22 NOTE — Telephone Encounter (Signed)
Sent patient a message thru MyChart 

## 2016-12-05 ENCOUNTER — Other Ambulatory Visit: Payer: Self-pay | Admitting: Primary Care

## 2016-12-05 DIAGNOSIS — I10 Essential (primary) hypertension: Secondary | ICD-10-CM

## 2016-12-20 ENCOUNTER — Telehealth: Payer: Self-pay | Admitting: Primary Care

## 2016-12-20 NOTE — Telephone Encounter (Signed)
Patient Name: Samantha Hall  DOB: 1961/03/29    Initial Comment Caller has had chills since Sunday, coughing, has back pain.    Nurse Assessment  Nurse: Raphael Gibney, RN, Vanita Ingles Date/Time (Eastern Time): 12/20/2016 2:32:35 PM  Confirm and document reason for call. If symptomatic, describe symptoms. ---Caller states she has had chills since Sunday off and on. Not feeling well. Has slight cough. Has pain in the middle of her back. She is drinking orange juice. Has body aches. Has slightly sore throat. Has a little nasal congestion. No fever.  Does the patient have any new or worsening symptoms? ---Yes  Will a triage be completed? ---Yes  Related visit to physician within the last 2 weeks? ---No  Does the PT have any chronic conditions? (i.e. diabetes, asthma, etc.) ---No  Is this a behavioral health or substance abuse call? ---No     Guidelines    Guideline Title Affirmed Question Affirmed Notes  Common Cold Cold with no complications (all triage questions negative)    Final Disposition User   Clyde, RN, Vera    Comments  Called primary number and left message. will try secondary number.  called secondary number and left message. Will try primary number in a few min.   Disagree/Comply: Comply

## 2016-12-20 NOTE — Telephone Encounter (Signed)
PLEASE NOTE: All timestamps contained within this report are represented as [place] Standard Time. CONFIDENTIALTY NOTICE: This fax transmission is intended only for the addressee. It contains information that is legally privileged, confidential or otherwise protected from use or disclosure. If you are not the intended recipient, you are strictly prohibited from reviewing, disclosing, copying using or disseminating any of this information or taking any action in reliance on or regarding this information. If you have received this fax in error, please notify us immediately by telephone so that we can arrange for its return to [place]. Phone: [phone], Toll-Free: [phone], Fax: [phone] Page: 1 of 2 Call Id: [id number] [place] Patient Name: Samantha Hall Gender: Female DOB: [birth year] Age: 56 Y 9 M 14 D Return Phone Number: [phone] (Primary), [phone] (Secondary) Address: City/State/Zip: [place] - Client Client Site [facility] - Day Physician [facility] - PHYSICIAN, NOT LISTED- MD Contact Type Call Who Is Calling Patient / Member / Family / Caregiver Call Type Triage / Clinical Relationship To Patient Self Return Phone Number [phone] (Primary) Chief Complaint Cough Reason for Call Symptomatic / Request for [place] has had chills since Sunday, coughing, has back pain. Appointment Disposition EMR Appointment Not Necessary Info pasted into Epic Yes PreDisposition Call Doctor Translation No Nurse Assessment Nurse: Raphael Gibney, RN, Vanita Ingles Date/Time [name] Time): 12/20/2016 2:32:35 PM Confirm and document reason for call. If symptomatic, describe symptoms. ---Caller states she has had chills since Sunday off and on. Not feeling well. Has slight cough. Has pain in the middle of her back. She  is drinking orange juice. Has body aches. Has slightly sore throat. Has a little nasal congestion. No fever. Does the patient have any new or worsening symptoms? ---Yes Will a triage be completed? ---Yes Related visit to physician within the last 2 weeks? ---No Does the PT have any chronic conditions? (i.e. diabetes, asthma, etc.) ---No Is this a behavioral health or substance abuse call? ---No Guidelines Guideline Title Affirmed Question Affirmed Notes Nurse Date/Time [name] Time) Common Cold Cold with no complications (all triage questions negative) Raphael Gibney, RN, Vanita Ingles 12/20/2016 2:35:27 PM Disp. Time [name] Time) Disposition Final User 12/20/2016 2:11:57 PM Attempt made - message left Raphael Gibney, RN, Vanita Ingles 12/20/2016 2:15:37 PM Attempt made - message left Raphael Gibney, RN, Vanita Ingles 12/20/2016 2:41:31 PM Home Care Yes Raphael Gibney, RN, Vanita Ingles PLEASE NOTE: All timestamps contained within this report are represented as [place] Standard Time. CONFIDENTIALTY NOTICE: This fax transmission is intended only for the addressee. It contains information that is legally privileged, confidential or otherwise protected from use or disclosure. If you are not the intended recipient, you are strictly prohibited from reviewing, disclosing, copying using or disseminating any of this information or taking any action in reliance on or regarding this information. If you have received this fax in error, please notify us immediately by telephone so that we can arrange for its return to [place]. Phone: [phone], Toll-Free: [phone], Fax: [phone] Page: 2 of 2 Call Id: [id number] Caller Understands: Yes Disagree/Comply: Comply Care Advice Given Per Guideline HOME CARE: You should be able to treat this at home. REASSURANCE: * It sounds like an uncomplicated cold that we can treat at home. * Colds are very common and may make you feel uncomfortable. * Cough: use cough drops. * Hydrate: drink extra liquids. * Sore throat:  throat lozenges, hard candy or warm chicken  broth. * Runny nose lasts over 10 days CALL BACK IF: * You become short of breath * Fever lasts over 3 days * You become worse. CARE ADVICE given per Colds (Adult) guideline. Comments User: Dannielle Burn, RN Date/Time [name] Time): 12/20/2016 2:11:49 PM Called primary number and left message. will try secondary number. User: Dannielle Burn, RN Date/Time [name] Time): 12/20/2016 2:14:06 PM called secondary number and left message. Will try primary number in a few min.

## 2016-12-20 NOTE — Telephone Encounter (Signed)
Noted and agree with triage.

## 2017-01-02 ENCOUNTER — Other Ambulatory Visit: Payer: Self-pay | Admitting: Primary Care

## 2017-01-02 DIAGNOSIS — I10 Essential (primary) hypertension: Secondary | ICD-10-CM

## 2017-01-30 ENCOUNTER — Other Ambulatory Visit: Payer: Self-pay | Admitting: Primary Care

## 2017-01-30 ENCOUNTER — Encounter: Payer: Self-pay | Admitting: Primary Care

## 2017-01-30 DIAGNOSIS — I10 Essential (primary) hypertension: Secondary | ICD-10-CM

## 2017-01-30 DIAGNOSIS — Z0000 Encounter for general adult medical examination without abnormal findings: Secondary | ICD-10-CM

## 2017-01-31 ENCOUNTER — Other Ambulatory Visit (INDEPENDENT_AMBULATORY_CARE_PROVIDER_SITE_OTHER): Payer: Managed Care, Other (non HMO)

## 2017-01-31 DIAGNOSIS — R768 Other specified abnormal immunological findings in serum: Secondary | ICD-10-CM

## 2017-01-31 DIAGNOSIS — I10 Essential (primary) hypertension: Secondary | ICD-10-CM

## 2017-01-31 DIAGNOSIS — Z0000 Encounter for general adult medical examination without abnormal findings: Secondary | ICD-10-CM

## 2017-01-31 LAB — COMPREHENSIVE METABOLIC PANEL
ALT: 40 U/L — ABNORMAL HIGH (ref 0–35)
AST: 21 U/L (ref 0–37)
Albumin: 3.9 g/dL (ref 3.5–5.2)
Alkaline Phosphatase: 76 U/L (ref 39–117)
BUN: 18 mg/dL (ref 6–23)
CO2: 30 mEq/L (ref 19–32)
Calcium: 8.9 mg/dL (ref 8.4–10.5)
Chloride: 106 mEq/L (ref 96–112)
Creatinine, Ser: 0.98 mg/dL (ref 0.40–1.20)
GFR: 75.32 mL/min (ref >60.00)
Glucose, Bld: 99 mg/dL (ref 70–99)
Potassium: 3.9 mEq/L (ref 3.5–5.1)
Sodium: 138 mEq/L (ref 135–145)
Total Bilirubin: 0.7 mg/dL (ref 0.2–1.2)
Total Protein: 6.7 g/dL (ref 6.0–8.3)

## 2017-01-31 LAB — LIPID PANEL
Cholesterol: 222 mg/dL — ABNORMAL HIGH (ref 0–200)
HDL: 81.1 mg/dL (ref >39.00)
LDL Cholesterol: 131 mg/dL — ABNORMAL HIGH (ref 0–99)
NonHDL: 140.48
Total CHOL/HDL Ratio: 3
Triglycerides: 48 mg/dL (ref 0.0–149.0)
VLDL: 9.6 mg/dL (ref 0.0–40.0)

## 2017-01-31 LAB — VITAMIN D 25 HYDROXY (VIT D DEFICIENCY, FRACTURES): VITD: 24.11 ng/mL — ABNORMAL LOW (ref 30.00–100.00)

## 2017-02-01 LAB — ANTI-NUCLEAR AB-TITER (ANA TITER): ANA Titer 1: 1:80 {titer} — ABNORMAL HIGH

## 2017-02-01 LAB — ANA: Anti Nuclear Antibody(ANA): POSITIVE — AB

## 2017-02-02 ENCOUNTER — Encounter: Payer: Self-pay | Admitting: Primary Care

## 2017-02-02 ENCOUNTER — Ambulatory Visit (INDEPENDENT_AMBULATORY_CARE_PROVIDER_SITE_OTHER): Payer: Managed Care, Other (non HMO) | Admitting: Primary Care

## 2017-02-02 DIAGNOSIS — Z0001 Encounter for general adult medical examination with abnormal findings: Secondary | ICD-10-CM | POA: Insufficient documentation

## 2017-02-02 DIAGNOSIS — M1991 Primary osteoarthritis, unspecified site: Secondary | ICD-10-CM | POA: Diagnosis not present

## 2017-02-02 DIAGNOSIS — Z0000 Encounter for general adult medical examination without abnormal findings: Secondary | ICD-10-CM

## 2017-02-02 DIAGNOSIS — E785 Hyperlipidemia, unspecified: Secondary | ICD-10-CM | POA: Diagnosis not present

## 2017-02-02 DIAGNOSIS — I10 Essential (primary) hypertension: Secondary | ICD-10-CM

## 2017-02-02 DIAGNOSIS — M1990 Unspecified osteoarthritis, unspecified site: Secondary | ICD-10-CM | POA: Insufficient documentation

## 2017-02-02 MED ORDER — AMLODIPINE BESYLATE 5 MG PO TABS: 5.0000 mg | ORAL_TABLET | Freq: Every day | ORAL | 3 refills | Status: DC

## 2017-02-02 NOTE — Assessment & Plan Note (Signed)
TC and LDL slightly above goal. Will have her work on diet and exercise. Repeat in 1 year.

## 2017-02-02 NOTE — Patient Instructions (Signed)
Cholesterol: Your cholesterol levels are just slighlty too high. Improvement in your diet and regular exercise will help to lower these levels. Reduce fast food, fried food, processed carbohydrates (chips, cookies, etc), sugary drinks. Increase consumption of fresh fruits and vegetables, whole grains, water. Exercise will also lower these levels.  Start exercising. You should be getting 150 minutes of moderate intensity exercise weekly.  Ensure you are consuming 64 ounces of water daily.  Voice Hoarseness:  This could be caused by allergies or silent acid reflux. Claritin is a good medication to help with allergy symptoms, Pepcid is a good medication to reduce acid reflux.  You may take Aleve as needed for arthritic flares.  Try Unisom as needed for difficulty sleeping. Avoid TV's/Tablets/Screen time before bed. Try reading from a book. Avoid caffeine after 3 pm in the afternoon. Regular exercise is key.  Restart your Vitamin D capsules as discussed.  Follow up in 1 year for your annual physical or sooner if needed.  It was a pleasure to see you today!  Insomnia Insomnia is a sleep disorder that makes it difficult to fall asleep or to stay asleep. Insomnia can cause tiredness (fatigue), low energy, difficulty concentrating, mood swings, and poor performance at work or school. There are three different ways to classify insomnia:  Difficulty falling asleep.  Difficulty staying asleep.  Waking up too early in the morning. Any type of insomnia can be long-term (chronic) or short-term (acute). Both are common. Short-term insomnia usually lasts for three months or less. Chronic insomnia occurs at least three times a week for longer than three months. What are the causes? Insomnia may be caused by another condition, situation, or substance, such as:  Anxiety.  Certain medicines.  Gastroesophageal reflux disease (GERD) or other gastrointestinal conditions.  Asthma or other breathing  conditions.  Restless legs syndrome, sleep apnea, or other sleep disorders.  Chronic pain.  Menopause. This may include hot flashes.  Stroke.  Abuse of alcohol, tobacco, or illegal drugs.  Depression.  Caffeine.  Neurological disorders, such as Alzheimer disease.  An overactive thyroid (hyperthyroidism). The cause of insomnia may not be known. What increases the risk? Risk factors for insomnia include:  Gender. Women are more commonly affected than men.  Age. Insomnia is more common as you get older.  Stress. This may involve your professional or personal life.  Income. Insomnia is more common in people with lower income.  Lack of exercise.  Irregular work schedule or night shifts.  Traveling between different time zones. What are the signs or symptoms? If you have insomnia, trouble falling asleep or trouble staying asleep is the main symptom. This may lead to other symptoms, such as:  Feeling fatigued.  Feeling nervous about going to sleep.  Not feeling rested in the morning.  Having trouble concentrating.  Feeling irritable, anxious, or depressed. How is this treated? Treatment for insomnia depends on the cause. If your insomnia is caused by an underlying condition, treatment will focus on addressing the condition. Treatment may also include:  Medicines to help you sleep.  Counseling or therapy.  Lifestyle adjustments. Follow these instructions at home:  Take medicines only as directed by your health care provider.  Keep regular sleeping and waking hours. Avoid naps.  Keep a sleep diary to help you and your health care provider figure out what could be causing your insomnia. Include:  When you sleep.  When you wake up during the night.  How well you sleep.  How rested you  feel the next day.  Any side effects of medicines you are taking.  What you eat and drink.  Make your bedroom a comfortable place where it is easy to fall asleep:  Put  up shades or special blackout curtains to block light from outside.  Use a white noise machine to block noise.  Keep the temperature cool.  Exercise regularly as directed by your health care provider. Avoid exercising right before bedtime.  Use relaxation techniques to manage stress. Ask your health care provider to suggest some techniques that may work well for you. These may include:  Breathing exercises.  Routines to release muscle tension.  Visualizing peaceful scenes.  Cut back on alcohol, caffeinated beverages, and cigarettes, especially close to bedtime. These can disrupt your sleep.  Do not overeat or eat spicy foods right before bedtime. This can lead to digestive discomfort that can make it hard for you to sleep.  Limit screen use before bedtime. This includes:  Watching TV.  Using your smartphone, tablet, and computer.  Stick to a routine. This can help you fall asleep faster. Try to do a quiet activity, brush your teeth, and go to bed at the same time each night.  Get out of bed if you are still awake after 15 minutes of trying to sleep. Keep the lights down, but try reading or doing a quiet activity. When you feel sleepy, go back to bed.  Make sure that you drive carefully. Avoid driving if you feel very sleepy.  Keep all follow-up appointments as directed by your health care provider. This is important. Contact a health care provider if:  You are tired throughout the day or have trouble in your daily routine due to sleepiness.  You continue to have sleep problems or your sleep problems get worse. Get help right away if:  You have serious thoughts about hurting yourself or someone else. This information is not intended to replace advice given to you by your health care provider. Make sure you discuss any questions you have with your health care provider. Document Released: 12/01/2000 Document Revised: 05/05/2016 Document Reviewed: 09/04/2014 Elsevier  Interactive Patient Education  2017 [employer].

## 2017-02-02 NOTE — Progress Notes (Signed)
Subjective:    Patient ID: Samantha Hall, female    DOB: Jan 24, 1961, 56 y.o.   MRN: [id number]  HPI  Samantha Hall is a 56 year old female who presents today for complete physical.  Immunizations: -Tetanus: Completed within 10 years.  -Influenza: Declines   Diet: She's eating a fair diet. Breakfast: Fruit, grits, occasional sausage biscuit, protein shake/bar Lunch: Salad with protein, occasional burger, chicken sandwich Dinner: Grilled meat, vegetable Snacks: Occasionally, nuts, veggies Desserts: Several times weekly Beverages: Coffee, juice, water, sweet tea  Exercise: She does not exercise. Plans on starting.  Eye exam: Completed July 2017 Dental exam: Completes semi- Colonoscopy: Completed at age 22, due at age 26. Pap Smear: Hysterectomy  Mammogram: Completed in December 2017   Review of Systems  Constitutional: Negative for unexpected weight change.  HENT: Negative for rhinorrhea.   Respiratory: Negative for cough and shortness of breath.   Cardiovascular: Negative for chest pain.  Gastrointestinal: Negative for constipation and diarrhea.  Genitourinary: Negative for difficulty urinating and menstrual problem.  Musculoskeletal: Positive for arthralgias. Negative for myalgias.       Right knee stiffness, right hip pain, right shoulder pain.   Skin: Negative for rash.  Allergic/Immunologic: Negative for environmental allergies.  Neurological: Negative for dizziness, numbness and headaches.  Psychiatric/Behavioral:       Under stress from work, denies anxiety or depression. Overall managing well.       Past Medical History:  Diagnosis Date  . Elevated antinuclear antibody (ANA) level    Negative workup for Lupus.  . Hypertension   . Sciatica      Social History   Social History  . Marital status: Married    Spouse name: N/A  . Number of children: N/A  . Years of education: N/A   Occupational History  . Not on file.   Social History Main Topics    . Smoking status: Never Smoker  . Smokeless tobacco: Never Used  . Alcohol use 0.0 oz/week     Comment: sociall  . Drug use: Unknown  . Sexual activity: Not on file   Other Topics Concern  . Not on file   Social History Narrative   Married.   Highest level of education Bachelors.    Works as a Education officer, museum.    Past Surgical History:  Procedure Laterality Date  . ABDOMINAL HYSTERECTOMY  2002  . CHOLECYSTECTOMY  2015  . TUBAL LIGATION  1984    No family history on file.  Allergies  Allergen Reactions  . Cefazolin Swelling    Difficulty breathing  . Sulfa Antibiotics     Current Outpatient Prescriptions on File Prior to Visit  Medication Sig Dispense Refill  . amLODipine (NORVASC) 5 MG tablet Take 1 tablet (5 mg total) by mouth daily. 30 tablet 1   No current facility-administered medications on file prior to visit.     BP 116/76   Pulse 66   Temp 98 F (36.7 C) (Oral)   Ht 5\' 5"  (1.651 m)   Wt 166 lb 6.4 oz (75.5 kg)   SpO2 98%   BMI 27.69 kg/m    Objective:   Physical Exam  Constitutional: She is oriented to person, place, and time. She appears well-nourished.  HENT:  Right Ear: Tympanic membrane and ear canal normal.  Left Ear: Tympanic membrane and ear canal normal.  Nose: Nose normal.  Mouth/Throat: Oropharynx is clear and moist.  Eyes: Conjunctivae and EOM are normal. Pupils are equal, round, and reactive  to light.  Neck: Neck supple. No thyromegaly present.  Cardiovascular: Normal rate and regular rhythm.   No murmur heard. Pulmonary/Chest: Effort normal and breath sounds normal. She has no rales.  Abdominal: Soft. Bowel sounds are normal. There is no tenderness.  Musculoskeletal: Normal range of motion.       Right shoulder: She exhibits normal range of motion, no tenderness and no swelling.       Right knee: She exhibits normal range of motion and no swelling. No tenderness found.  Stiffness to right knee with mild decrease in ROM with  flexion, improves after movement.  Lymphadenopathy:    She has no cervical adenopathy.  Neurological: She is alert and oriented to person, place, and time. She has normal reflexes. No cranial nerve deficit.  Skin: Skin is warm and dry. No rash noted.  Psychiatric: She has a normal mood and affect.          Assessment & Plan:

## 2017-02-02 NOTE — Assessment & Plan Note (Signed)
Stable on amlodipine 5 mg, continue same. BMP unremarkable.

## 2017-02-02 NOTE — Progress Notes (Signed)
Pre visit review using our clinic review tool, if applicable. No additional management support is needed unless otherwise documented below in the visit note. 

## 2017-02-02 NOTE — Assessment & Plan Note (Signed)
Td UTD per patient, declines influenza vaccination. Mammogram UTD. Discussed the importance of a healthy diet and regular exercise in order for weight loss, and to reduce the risk of other medical diseases. Exam stable. Labs stable, mild hyperlipidemia and vitamin D def. Follow up in 1 year for annual physical.

## 2017-02-02 NOTE — Assessment & Plan Note (Signed)
Stiffness to right knee, shoulder, hip. Better after movement. Discussed to start exercising, use NSAIDS PRN. Exam today mostly unremarkable. Continue to monitor.

## 2017-04-02 ENCOUNTER — Encounter: Payer: Self-pay | Admitting: Family Medicine

## 2017-04-02 ENCOUNTER — Ambulatory Visit (INDEPENDENT_AMBULATORY_CARE_PROVIDER_SITE_OTHER): Payer: 59 | Admitting: Family Medicine

## 2017-04-02 VITALS — BP 138/68 | HR 74 | Temp 97.5°F | Ht 65.0 in | Wt 169.5 lb

## 2017-04-02 DIAGNOSIS — M10071 Idiopathic gout, right ankle and foot: Secondary | ICD-10-CM

## 2017-04-02 MED ORDER — INDOMETHACIN 50 MG PO CAPS: 50.0000 mg | ORAL_CAPSULE | Freq: Three times a day (TID) | ORAL | 2 refills | Status: DC

## 2017-04-02 MED ORDER — COLCHICINE 0.6 MG PO TABS: 0.6000 mg | ORAL_TABLET | Freq: Two times a day (BID) | ORAL | 2 refills | Status: DC

## 2017-04-02 NOTE — Progress Notes (Signed)
Pre visit review using our clinic review tool, if applicable. No additional management support is needed unless otherwise documented below in the visit note. 

## 2017-04-02 NOTE — Progress Notes (Signed)
Dr. Frederico Hamman T. Delanie Tirrell, MD, [facility] [place], [address] Phone: [phone] Fax: [phone]  04/02/2017  Patient: Samantha Hall, MRN: [id number], DOB: [birth year], 56 y.o.  Primary Physician:  Sheral Flow, NP   Chief Complaint  Patient presents with  . Foot Pain    Right-Started this morning.  No injury   Subjective:   Samantha Hall is a 56 y.o. very pleasant female patient who presents with the following:  R ankle - gout. New onset. No significant injury or trauma. She is having pain on the lateral aspect of her ankle as well as in the anterior aspect of her ankle. There is some mild warmth only. No significant swelling. No fall, twisting of her ankle, or no significant injury at all that she can recall.  She has no known history of gout or CPPD.  Past Medical History, Surgical History, Social History, Family History, Problem List, Medications, and Allergies have been reviewed and updated if relevant.  Patient Active Problem List   Diagnosis Date Noted  . Osteoarthritis 02/02/2017  . Preventative health care 02/02/2017  . Hyperlipidemia 02/02/2017  . Essential hypertension 11/22/2015    Past Medical History:  Diagnosis Date  . Elevated antinuclear antibody (ANA) level    Negative workup for Lupus.  . Hyperlipidemia   . Hypertension   . Osteoarthritis   . Sciatica   . Vitamin D deficiency     Past Surgical History:  Procedure Laterality Date  . ABDOMINAL HYSTERECTOMY  2002  . CHOLECYSTECTOMY  2015  . TUBAL LIGATION  1984    Social History   Social History  . Marital status: Married    Spouse name: N/A  . Number of children: N/A  . Years of education: N/A   Occupational History  . Not on file.   Social History Main Topics  . Smoking status: Never Smoker  . Smokeless tobacco: Never Used  . Alcohol use 0.0 oz/week     Comment: sociall  . Drug use: Unknown  . Sexual  activity: Not on file   Other Topics Concern  . Not on file   Social History Narrative   Married.   Highest level of education Bachelors.    Works as a Education officer, museum.    No family history on file.  Allergies  Allergen Reactions  . Cefazolin Swelling    Difficulty breathing  . Sulfa Antibiotics     Medication list reviewed and updated in full in [facility].  GEN: No fevers, chills. Nontoxic. Primarily MSK c/o today. MSK: Detailed in the HPI GI: tolerating PO intake without difficulty Neuro: No numbness, parasthesias, or tingling associated. Otherwise the pertinent positives of the ROS are noted above.   Objective:   Ht 5\' 5"  (1.651 m)   Wt 169 lb 8 oz (76.9 kg)   BMI 28.21 kg/m    GEN: WDWN, NAD, Non-toxic, Alert & Oriented x 3 HEENT: Atraumatic, Normocephalic.  Ears and Nose: No external deformity. EXTR: No clubbing/cyanosis/edema NEURO: Normal gait. Antalgic. PSYCH: Normally interactive. Conversant. Not depressed or anxious appearing.  Calm demeanor.    Tibia and fibula are nontender. Patient has tenderness as well as mild warmth on the anterior ankle and at the talus. Also this is present at the medial ankle. Less tender on the lateral ankle. Entire midfoot and forefoot is nontender.  Radiology: No results found.  Assessment and Plan:   Acute idiopathic gout of right ankle  History and exam are consistent with acute gout of the ankle versus CPPD.  Follow-up: No Follow-up on file.  Meds ordered this encounter  Medications  . TURMERIC PO    Sig: Take 4 capsules by mouth daily.  . indomethacin (INDOCIN) 50 MG capsule    Sig: Take 1 capsule (50 mg total) by mouth 3 (three) times daily with meals.    Dispense:  50 capsule    Refill:  2  . colchicine 0.6 MG tablet    Sig: Take 1 tablet (0.6 mg total) by mouth 2 (two) times daily.    Dispense:  60 tablet    Refill:  2   There are no discontinued medications. No orders of the defined types were  placed in this encounter.   Signed,  Maud Deed. Diva Lemberger, MD   Allergies as of 04/02/2017      Reactions   Cefazolin Swelling   Difficulty breathing   Sulfa Antibiotics       Medication List       Accurate as of 04/02/17  2:45 PM. Always use your most recent med list.          amLODipine 5 MG tablet Commonly known as:  NORVASC Take 1 tablet (5 mg total) by mouth daily.   colchicine 0.6 MG tablet Take 1 tablet (0.6 mg total) by mouth 2 (two) times daily.   indomethacin 50 MG capsule Commonly known as:  INDOCIN Take 1 capsule (50 mg total) by mouth 3 (three) times daily with meals.   TURMERIC PO Take 4 capsules by mouth daily.

## 2017-04-04 ENCOUNTER — Telehealth: Payer: Self-pay

## 2017-04-04 NOTE — Telephone Encounter (Signed)
She should only take 1 tablet of colchicine twice a day. When she is asymptomatic, then stop 3 days after her symptoms resolve.   No follow-up is typically needed.  No monitoring is needed.   I am not sure I understand the question. She can always follow-up with her primary care provider if she would like.

## 2017-04-04 NOTE — Telephone Encounter (Signed)
Pt left a vm stating she was here 2 days ago. She said she was put on 2 different medications one of them is colchicine and one of the side effects for that medication is death she wants to know how this medication is going to be monitored to make sure she doesn't have too much of it in her "system" since she wasn't given a follow up appt and wasn't told to come back to have levels checked.

## 2017-04-04 NOTE — Telephone Encounter (Signed)
Samantha Hall notified as instructed by telephone.  She was advised she can stop the Colchicine and Indomethacin 3 days after her symptoms resolve.  Patient states understanding.

## 2017-04-09 NOTE — Telephone Encounter (Signed)
Message left for patient to return my call.  

## 2017-04-09 NOTE — Telephone Encounter (Signed)
Pt left v/m; pt not taking colchicine and Indomethacin for gout now. Pt wants to know if can take ibuprofen for stiffness and pain in knee and hip. Pt request cb.

## 2017-04-09 NOTE — Telephone Encounter (Signed)
Please notify patient that it is okay to take Ibuprofen as needed for knee stiffness and pain as long as she doesn't take it with the indomethacin. Do not use more than 2400 mg in 24 hours.

## 2017-04-10 NOTE — Telephone Encounter (Signed)
Spoken and notified patient of Kate's comments. Patient verbalized understanding. 

## 2017-07-12 ENCOUNTER — Telehealth: Payer: Self-pay | Admitting: Primary Care

## 2017-07-12 NOTE — Telephone Encounter (Signed)
Patient Name: Samantha Hall  DOB: [birth year]    Initial Comment Caller says she is having pain on the right side near her hip    Nurse Assessment  Nurse: Raphael Gibney, RN, Vanita Ingles Date/Time (Eastern Time): 07/12/2017 2:55:38 PM  Confirm and document reason for call. If symptomatic, describe symptoms. ---Caller states she is having pain on the right side near her hip. She stepped in a hole and fell about an hr ago. Fell on her right side and has pain in her right hip and lower back.  Does the patient have any new or worsening symptoms? ---Yes  Will a triage be completed? ---Yes  Related visit to physician within the last 2 weeks? ---No  Does the PT have any chronic conditions? (i.e. diabetes, asthma, etc.) ---No  Is this a behavioral health or substance abuse call? ---No     Guidelines    Guideline Title Affirmed Question Affirmed Notes  Hip Injury [1] High-risk adult (e.g., age > 2, osteoporosis, chronic steroid use) AND [2] limping    Final Disposition User   See Physician within 4 Hours (or PCP triage) Raphael Gibney, RN, Vera    Comments  triage outcome upgraded to see physician within 4 hrs as pt is having pain in her right rib area, right knee and right hip. Hip may be swollen.  no appts available within 4 hrs. Pt states she will go to urgent care.   Referrals  GO TO FACILITY OTHER - SPECIFY   Disagree/Comply: Comply

## 2017-07-13 NOTE — Telephone Encounter (Signed)
Noted  

## 2017-07-13 NOTE — Telephone Encounter (Signed)
Spoken to patient. She is doing okay. She was seen at [facility] clinic yesterday. She had x-rays done, given pain medication and muscle relaxer.   She will let us know if not better next week.

## 2017-07-13 NOTE — Telephone Encounter (Signed)
Noted. Please call and check on patient. Was she evaluated anywhere? How's she doing?

## 2017-07-17 ENCOUNTER — Ambulatory Visit: Payer: 59 | Admitting: Primary Care

## 2017-07-18 ENCOUNTER — Encounter: Payer: Self-pay | Admitting: *Deleted

## 2017-07-18 ENCOUNTER — Encounter: Payer: Self-pay | Admitting: Primary Care

## 2017-07-18 ENCOUNTER — Ambulatory Visit (INDEPENDENT_AMBULATORY_CARE_PROVIDER_SITE_OTHER): Payer: 59 | Admitting: Primary Care

## 2017-07-18 VITALS — BP 134/78 | HR 65 | Temp 97.7°F | Wt 171.0 lb

## 2017-07-18 DIAGNOSIS — M545 Low back pain, unspecified: Secondary | ICD-10-CM

## 2017-07-18 DIAGNOSIS — M25551 Pain in right hip: Secondary | ICD-10-CM

## 2017-07-18 NOTE — Patient Instructions (Signed)
Start stretching as discussed to prevent stiffness.  Apply heat/ice to the painful sites.  Continue Flexeril/Ibuprofen as needed. Use the Norco sparingly.  Please notify me if no continued improvement in 3-4 days.  It was a pleasure to see you today!

## 2017-07-18 NOTE — Progress Notes (Signed)
Subjective:    Patient ID: Samantha Hall, female    DOB: May 07, 1961, 56 y.o.   MRN: [id number]  HPI  Samantha Hall is a 56 year old female who presents today for Urgent Care Follow Up.  She presented to [facility] on 07/12/17 with a chief complaint of right sided lower back pain with radiation to her right hip/pelvis. She was walking in a gravel parking lot and her foot stepped into an open pipe, hyperextended the foot, fell forward landing on her hands and knees.   She underwent xray of her hip and pelvis and lumbar plain films which were unremarkable. She was provided with a prescription for Norco, Flexeril, Ibuprofen. She was also provided with home care instructions.   Since her visit at Urgent Care she's noticing gradual improvement but does continue to notice pain, especially when moving around for more than several hours. Intermittent numbness to her feet. The pain is mostly to her right hip and right lower back. She's not yet returned to work and has not been since July 26th. She is needing a work excuse. She plans on returning Friday this week. She's using the Norco sparingly.  Review of Systems  Musculoskeletal: Positive for arthralgias and back pain.       Right hip pain  Skin: Negative for wound.  Neurological: Negative for weakness.       Past Medical History:  Diagnosis Date  . Elevated antinuclear antibody (ANA) level    Negative workup for Lupus.  . Hyperlipidemia   . Hypertension   . Osteoarthritis   . Sciatica   . Vitamin D deficiency      Social History   Social History  . Marital status: Married    Spouse name: N/A  . Number of children: N/A  . Years of education: N/A   Occupational History  . Not on file.   Social History Main Topics  . Smoking status: Never Smoker  . Smokeless tobacco: Never Used  . Alcohol use 0.0 oz/week     Comment: sociall  . Drug use: No  . Sexual activity: Not on file   Other Topics Concern  . Not on file    Social History Narrative   Married.   Highest level of education Bachelors.    Works as a Education officer, museum.    Past Surgical History:  Procedure Laterality Date  . ABDOMINAL HYSTERECTOMY  2002  . CHOLECYSTECTOMY  2015  . TUBAL LIGATION  1984    No family history on file.  Allergies  Allergen Reactions  . Cefazolin Swelling    Difficulty breathing  . Sulfa Antibiotics     Current Outpatient Prescriptions on File Prior to Visit  Medication Sig Dispense Refill  . amLODipine (NORVASC) 5 MG tablet Take 1 tablet (5 mg total) by mouth daily. 90 tablet 3  . TURMERIC PO Take 4 capsules by mouth daily.     No current facility-administered medications on file prior to visit.     BP 134/78 (BP Location: Left Arm, Patient Position: Sitting, Cuff Size: Normal)   Pulse 65   Temp 97.7 F (36.5 C) (Oral)   Wt 171 lb (77.6 kg)   SpO2 99%   BMI 28.46 kg/m    Objective:   Physical Exam  Constitutional: She appears well-nourished.  Neck: Neck supple.  Cardiovascular: Normal rate and regular rhythm.   Pulmonary/Chest: Effort normal and breath sounds normal.  Musculoskeletal:       Right hip: She exhibits decreased  range of motion. She exhibits normal strength.       Lumbar back: She exhibits decreased range of motion, tenderness, pain and spasm.       Back:       Arms: Ambulates well in the office.  Skin: Skin is warm and dry.          Assessment & Plan:  Hip and Back Pain:  Since fall on 07/12/17. Overall recovering gradually. Would like a few more days to rest. Exam today with decrease in ROM due to injury, muscle spasm to right mid and right lower back noted.  Will have her continue current regimen, start stretching exercises, heat/ice. If no continued improvement then will send to PT. Work note provided.  All Urgent care notes reviewed.  Sheral Flow, NP

## 2017-07-19 ENCOUNTER — Encounter: Payer: Self-pay | Admitting: Primary Care

## 2017-07-31 ENCOUNTER — Encounter: Payer: Self-pay | Admitting: Primary Care

## 2017-07-31 DIAGNOSIS — M545 Low back pain: Secondary | ICD-10-CM

## 2017-08-01 MED ORDER — IBUPROFEN 800 MG PO TABS: 800.0000 mg | ORAL_TABLET | Freq: Three times a day (TID) | ORAL | 0 refills | Status: DC | PRN

## 2017-08-09 ENCOUNTER — Telehealth: Payer: Self-pay

## 2017-08-09 DIAGNOSIS — M5441 Lumbago with sciatica, right side: Secondary | ICD-10-CM

## 2017-08-09 NOTE — Telephone Encounter (Signed)
Noted, please check with the patient to see if she's okay with seeing orthopedics, if so then I'll place a referral.

## 2017-08-09 NOTE — Telephone Encounter (Signed)
Noted- referral placed

## 2017-08-09 NOTE — Telephone Encounter (Signed)
Spoken to patient and she is agreeable to the referral to orthopedics.

## 2017-08-09 NOTE — Telephone Encounter (Signed)
Pt has been seeing Samantha Hall, chiropractor and pt still having problems with sitting and nerve problems in legs. Samantha Hall is to call Samantha Bossier NP to discuss possible referral to ortho or pt may need MRI. Pt request cb after PCP talks with chiropractor. Pt also wants to know if she will need to come in and see [name] after she talks with chiropractor. Please advise.

## 2017-08-09 NOTE — Telephone Encounter (Addendum)
Dr Deeann Cree, the chiropractor called the office and wanting to speak to [facility] regarding patient. However, [name] is still seeing patient and unable to the call.  The chiropractor stated that patient has been for a couple of session already and pain is not better also feels worse. She suggest that patient may need referral to ortho or more test like MRI.  Please advise.  Dr Deeann Cree can be reach at office number [phone] or mobile number is [phone].

## 2017-08-12 ENCOUNTER — Encounter: Payer: Self-pay | Admitting: Primary Care

## 2017-08-13 NOTE — Telephone Encounter (Signed)
HI Samantha Hall, anyway we can get her in sooner with Ortho?

## 2017-08-14 ENCOUNTER — Emergency Department
Admission: EM | Admit: 2017-08-14 | Discharge: 2017-08-14 | Disposition: A | Discharge status: Home / Self Care | Payer: 59 | Attending: Emergency Medicine | Admitting: Emergency Medicine

## 2017-08-14 ENCOUNTER — Encounter: Payer: Self-pay | Admitting: Emergency Medicine

## 2017-08-14 ENCOUNTER — Emergency Department: Payer: 59

## 2017-08-14 ENCOUNTER — Telehealth: Payer: Self-pay | Admitting: *Deleted

## 2017-08-14 DIAGNOSIS — M5441 Lumbago with sciatica, right side: Secondary | ICD-10-CM

## 2017-08-14 DIAGNOSIS — R531 Weakness: Secondary | ICD-10-CM | POA: Diagnosis not present

## 2017-08-14 DIAGNOSIS — Z8571 Personal history of Hodgkin lymphoma: Secondary | ICD-10-CM | POA: Insufficient documentation

## 2017-08-14 DIAGNOSIS — R519 Headache, unspecified: Secondary | ICD-10-CM

## 2017-08-14 DIAGNOSIS — R51 Headache: Secondary | ICD-10-CM

## 2017-08-14 DIAGNOSIS — I10 Essential (primary) hypertension: Secondary | ICD-10-CM | POA: Diagnosis not present

## 2017-08-14 DIAGNOSIS — R200 Anesthesia of skin: Secondary | ICD-10-CM | POA: Diagnosis present

## 2017-08-14 DIAGNOSIS — G43809 Other migraine, not intractable, without status migrainosus: Secondary | ICD-10-CM | POA: Insufficient documentation

## 2017-08-14 LAB — COMPREHENSIVE METABOLIC PANEL
ALT: 45 U/L (ref 14–54)
AST: 33 U/L (ref 15–41)
Albumin: 4.1 g/dL (ref 3.5–5.0)
Alkaline Phosphatase: 92 U/L (ref 38–126)
Anion gap: 7 (ref 5–15)
BUN: 17 mg/dL (ref 6–20)
CO2: 24 mmol/L (ref 22–32)
Calcium: 9.1 mg/dL (ref 8.9–10.3)
Chloride: 105 mmol/L (ref 101–111)
Creatinine, Ser: 0.68 mg/dL (ref 0.44–1.00)
GFR calc Af Amer: >60 mL/min (ref >60)
GFR calc non Af Amer: >60 mL/min (ref >60)
Glucose, Bld: 96 mg/dL (ref 65–99)
Potassium: 3.7 mmol/L (ref 3.5–5.1)
Sodium: 136 mmol/L (ref 135–145)
Total Bilirubin: 0.8 mg/dL (ref 0.3–1.2)
Total Protein: 7.7 g/dL (ref 6.5–8.1)

## 2017-08-14 LAB — DIFFERENTIAL
Basophils Absolute: 0 10*3/uL (ref 0–0.1)
Basophils Relative: 1 %
Eosinophils Absolute: 0.1 10*3/uL (ref 0–0.7)
Eosinophils Relative: 1 %
Lymphocytes Relative: 48 %
Lymphs Abs: 2.5 10*3/uL (ref 1.0–3.6)
Monocytes Absolute: 0.5 10*3/uL (ref 0.2–0.9)
Monocytes Relative: 9 %
Neutro Abs: 2.1 10*3/uL (ref 1.4–6.5)
Neutrophils Relative %: 41 %

## 2017-08-14 LAB — CBC
HCT: 40.3 % (ref 35.0–47.0)
Hemoglobin: 13.8 g/dL (ref 12.0–16.0)
MCH: 30.6 pg (ref 26.0–34.0)
MCHC: 34.2 g/dL (ref 32.0–36.0)
MCV: 89.3 fL (ref 80.0–100.0)
Platelets: 275 10*3/uL (ref 150–440)
RBC: 4.51 MIL/uL (ref 3.80–5.20)
RDW: 12.8 % (ref 11.5–14.5)
WBC: 5.2 10*3/uL (ref 3.6–11.0)

## 2017-08-14 LAB — PROTIME-INR
INR: 1
Prothrombin Time: 13.2 seconds (ref 11.4–15.2)

## 2017-08-14 LAB — TROPONIN I: Troponin I: <0.03 ng/mL (ref <0.03)

## 2017-08-14 LAB — APTT: aPTT: 27 seconds (ref 24–36)

## 2017-08-14 MED ORDER — LORAZEPAM 2 MG/ML IJ SOLN
INTRAMUSCULAR | Status: AC
  Administered 2017-08-14: 1 mg via INTRAMUSCULAR
  Filled 2017-08-14: qty 1

## 2017-08-14 MED ORDER — LORAZEPAM 2 MG/ML IJ SOLN
1.0000 mg | Freq: Once | INTRAMUSCULAR | Status: AC
  Administered 2017-08-14: 1 mg via INTRAMUSCULAR

## 2017-08-14 MED ORDER — IBUPROFEN 800 MG PO TABS
800.0000 mg | ORAL_TABLET | Freq: Once | ORAL | Status: AC
  Administered 2017-08-14: 800 mg via ORAL
  Filled 2017-08-14: qty 1

## 2017-08-14 NOTE — ED Notes (Signed)
Pt talking to MRI at this time. PT is alert and oriented x 4.

## 2017-08-14 NOTE — ED Notes (Signed)
While in MRI pt reported feeling claustrophobic. RN informed ED MD. Medication given in MRI.

## 2017-08-14 NOTE — ED Triage Notes (Signed)
Pt to ed with c/o doing water aerobics today and then had numbness in all 4 extremities.  Pt then reports left sided weakness since.  No facial drooping, no slurred speech noted.  Pt alert and oriented and denies headache at this time.

## 2017-08-14 NOTE — Telephone Encounter (Signed)
Spoken and notified patient of Kate's comments. Patient verbalized understanding. 

## 2017-08-14 NOTE — Telephone Encounter (Signed)
Please notify patient that I placed the order for the MRI so someone should be in touch with her soon.

## 2017-08-14 NOTE — ED Provider Notes (Signed)
[facility] Emergency Department Provider Note  ____________________________________________  Time seen: Approximately 5:56 PM  I have reviewed the triage vital signs and the nursing notes.   HISTORY  Chief Complaint Numbness and Weakness    HPI Samantha Hall is a 56 y.o. femalewith a history of HTN, HL, presenting with bilateral upper extremity, bilateral lower 70, and left cheek numbness. The patient reports that around 9:30 or 10 AM, she was in the water when she developed bilateral arm numbness, followed by bilateral leg numbness. She had some generalized weakness but no focal weakness and continued to be able to swim and walk. In addition, she noted numbness on the left cheek.She then rested a while and developed a posterior headache. No visual changes, speech changes, her mental status changes. She has not had any recent trauma, changes in her medications. Over time, all of her symptoms have resolved, except for her headache. She has not tried any medications for her headache.  The patient does also report that she had a mechanical fall on a/26 and has been having some low back pain and lower extremity numbness since then. She has not had any urinary or fecal incontinence or retention; no specific saddle anesthesia.She is planning to see her primary care physician for this.  The patient does have a remote history of migraines, greater than 20 years ago, that have been associated with cheek numbness in the past.   Past Medical History:  Diagnosis Date  . Elevated antinuclear antibody (ANA) level    Negative workup for Lupus.  . Hyperlipidemia   . Hypertension   . Osteoarthritis   . Sciatica   . Vitamin D deficiency     Patient Active Problem List   Diagnosis Date Noted  . Osteoarthritis 02/02/2017  . Preventative health care 02/02/2017  . Hyperlipidemia 02/02/2017  . Essential hypertension 11/22/2015    Past Surgical History:  Procedure  Laterality Date  . ABDOMINAL HYSTERECTOMY  2002  . CHOLECYSTECTOMY  2015  . TUBAL LIGATION  1984    Current Outpatient Rx  . Order #: [id number] Class: Normal  . Order #: [id number] Class: Historical Med  . Order #: [id number] Class: Historical Med  . Order #: [id number] Class: Normal  . Order #: [id number] Class: Historical Med    Allergies Cefazolin and Sulfa antibiotics  History reviewed. No pertinent family history.  Social History Social History  Substance Use Topics  . Smoking status: Never Smoker  . Smokeless tobacco: Never Used  . Alcohol use 0.0 oz/week     Comment: sociall    Review of Systems Constitutional: No fever/chills.no lightheadedness or syncope. Eyes: No visual changes.no blurred or double vision. ENT: No sore throat. No congestion or rhinorrhea. Cardiovascular: Denies chest pain. Denies palpitations. Respiratory: Denies shortness of breath.  No cough. Gastrointestinal: No abdominal pain.  No nausea, no vomiting.  No diarrhea.  No constipation. Genitourinary: Negative for dysuria. Musculoskeletal: Positive for back pain. Skin: Negative for rash. Neurological: positive headache. Positive cheek numbness. Positive upper and lower extremity numbness bilaterally. No vision or speech changes. No confusion. No difficulty walking. No motor strength weakness.    ____________________________________________   PHYSICAL EXAM:  VITAL SIGNS: ED Triage Vitals [08/14/17 1516]  Enc Vitals Group     BP (!) 154/86     Pulse Rate 67     Resp 18     Temp 98.2 F (36.8 C)     Temp Source Oral     SpO2 100 %     Weight  171 lb (77.6 kg)     Height      Head Circumference      Peak Flow      Pain Score 0     Pain Loc      Pain Edu?      Excl. in [place]?     Constitutional: Alert and oriented. Well appearing and in no acute distress. Answers questions appropriately. Eyes: Conjunctivae are normal.  EOMI. No scleral icterus. Head: Atraumatic. Nose: No  congestion/rhinnorhea. Mouth/Throat: Mucous membranes are moist.  Neck: No stridor.  Supple.  No JVD. No meningismus. Cardiovascular: Normal rate, regular rhythm. No murmurs, rubs or gallops.  Respiratory: Normal respiratory effort.  No accessory muscle use or retractions. Lungs CTAB.  No wheezes, rales or ronchi. Gastrointestinal: Soft, nontender and nondistended.  No guarding or rebound.  No peritoneal signs. Musculoskeletal: No LE edema. No ttp in the calves or palpable cords.  Negative Homan's sign. Neurologic: Alert and oriented 3. Speech is clear.  Face and smile symmetric. Tongue is midline. EOMI. PERRLA. No horizontal or vertical nystagmus. No pronator drift. 5 out of 5 grip, biceps, triceps, hip flexors, plantar flexion and dorsiflexion. Normal sensation to light touch in the bilateral upper and lower extremities, and face. Normal heel-to-shin. Skin:  Skin is warm, dry and intact. No rash noted. Psychiatric: Mood and affect are normal. Speech and behavior are normal.  Normal judgement.  ____________________________________________   LABS (all labs ordered are listed, but only abnormal results are displayed)  Labs Reviewed  PROTIME-INR  APTT  CBC  DIFFERENTIAL  COMPREHENSIVE METABOLIC PANEL  TROPONIN I   ____________________________________________  EKG  ED ECG REPORT I, Eula Listen, the attending physician, personally viewed and interpreted this ECG.   Date: 08/14/2017  EKG Time: 1519  Rate: 69  Rhythm: normal sinus rhythm  Axis: normal  Intervals:none  ST&T Change: Nonspecific T-wave inversion in V1 and V2, V4 and V5. No STEMI.  ____________________________________________  RADIOLOGY  Ct Head Wo Contrast  Result Date: 08/14/2017 CLINICAL DATA:  Bilateral leg numbness. Headache and left-sided weakness. EXAM: CT HEAD WITHOUT CONTRAST TECHNIQUE: Contiguous axial images were obtained from the base of the skull through the vertex without intravenous  contrast. COMPARISON:  None. FINDINGS: Brain: No evidence of acute infarction, hemorrhage, hydrocephalus, extra-axial collection or mass lesion/mass effect. Vascular: No hyperdense vessel or unexpected calcification. Skull: Normal. Negative for fracture or focal lesion. Sinuses/Orbits: Visualize globes and orbits are unremarkable. The visualized sinuses and mastoid air cells are clear. Other: None. IMPRESSION: Normal unenhanced CT scan of the brain. Electronically Signed   By: Lajean Manes M.D.   On: 08/14/2017 16:00    ____________________________________________   PROCEDURES  Procedure(s) performed: None  Procedures  Critical Care performed: No ____________________________________________   INITIAL IMPRESSION / ASSESSMENT AND PLAN / ED COURSE  Pertinent labs & imaging results that were available during my care of the patient were reviewed by me and considered in my medical decision making (see chart for details).  56 y.o. emale with a history of migraines, HTN, HL, presenting with bilateral upper and lower extremity numbness, left cheek numbness, and headache. cVA would be atypical given the bilaterality of the patient's symptoms. Atypical migraine is more likely. I'll plan to treat the patient's headache at this time. Her CT scan does not show stroke and we will do an MRI but if that test is negative, the patient will be discharged home with close PMD follow-up.return precautions as well as follow-up instructions were discussed.  -----------------------------------------  8:17 PM on 08/14/2017 -----------------------------------------  The patient's MRI does not show any acute process. At this time the patient is safe for discharge. She understands return precautions as well as follow-up instructions.  ____________________________________________  FINAL CLINICAL IMPRESSION(S) / ED DIAGNOSES  Final diagnoses:  Acute nonintractable headache, unspecified headache type  Numbness          NEW MEDICATIONS STARTED DURING THIS VISIT:  New Prescriptions   No medications on file  Eula Listen, MD 08/14/17 2017

## 2017-08-14 NOTE — Discharge Instructions (Signed)
Please return to the emergency department if you develop severe pain, numbness tingling or weakness, changes in vision or speech, confusion, or any other symptoms concerning to you.

## 2017-08-14 NOTE — Telephone Encounter (Signed)
Patient left a voicemail stating that she is still out with her back injury. Patient stated that she is having numbness in legs, arms and now face. Patient stated that she can not get in with orthopedist until 08/23/17. Patient stated that she would like to go ahead and get an order to have an MRI done to find out what is going on.

## 2017-08-16 ENCOUNTER — Telehealth: Payer: Self-pay | Admitting: Primary Care

## 2017-08-16 ENCOUNTER — Other Ambulatory Visit: Payer: Self-pay

## 2017-08-16 ENCOUNTER — Emergency Department: Payer: 59

## 2017-08-16 ENCOUNTER — Encounter: Payer: Self-pay | Admitting: Primary Care

## 2017-08-16 ENCOUNTER — Emergency Department
Admission: EM | Admit: 2017-08-16 | Discharge: 2017-08-16 | Disposition: A | Discharge status: Home / Self Care | Payer: 59 | Attending: Emergency Medicine | Admitting: Emergency Medicine

## 2017-08-16 ENCOUNTER — Encounter: Payer: Self-pay | Admitting: Emergency Medicine

## 2017-08-16 DIAGNOSIS — R457 State of emotional shock and stress, unspecified: Secondary | ICD-10-CM | POA: Diagnosis not present

## 2017-08-16 DIAGNOSIS — M545 Low back pain: Secondary | ICD-10-CM | POA: Diagnosis not present

## 2017-08-16 DIAGNOSIS — R200 Anesthesia of skin: Secondary | ICD-10-CM | POA: Diagnosis not present

## 2017-08-16 DIAGNOSIS — I10 Essential (primary) hypertension: Secondary | ICD-10-CM | POA: Insufficient documentation

## 2017-08-16 DIAGNOSIS — R202 Paresthesia of skin: Secondary | ICD-10-CM | POA: Insufficient documentation

## 2017-08-16 DIAGNOSIS — G8929 Other chronic pain: Secondary | ICD-10-CM | POA: Insufficient documentation

## 2017-08-16 DIAGNOSIS — Z79899 Other long term (current) drug therapy: Secondary | ICD-10-CM | POA: Diagnosis not present

## 2017-08-16 DIAGNOSIS — M5430 Sciatica, unspecified side: Secondary | ICD-10-CM | POA: Diagnosis not present

## 2017-08-16 LAB — COMPREHENSIVE METABOLIC PANEL
ALT: 38 U/L (ref 14–54)
AST: 29 U/L (ref 15–41)
Albumin: 4.3 g/dL (ref 3.5–5.0)
Alkaline Phosphatase: 88 U/L (ref 38–126)
Anion gap: 7 (ref 5–15)
BUN: 15 mg/dL (ref 6–20)
CO2: 26 mmol/L (ref 22–32)
Calcium: 9.6 mg/dL (ref 8.9–10.3)
Chloride: 105 mmol/L (ref 101–111)
Creatinine, Ser: 0.82 mg/dL (ref 0.44–1.00)
GFR calc Af Amer: >60 mL/min (ref >60)
GFR calc non Af Amer: >60 mL/min (ref >60)
Glucose, Bld: 101 mg/dL — ABNORMAL HIGH (ref 65–99)
Potassium: 4.1 mmol/L (ref 3.5–5.1)
Sodium: 138 mmol/L (ref 135–145)
Total Bilirubin: 1 mg/dL (ref 0.3–1.2)
Total Protein: 8.1 g/dL (ref 6.5–8.1)

## 2017-08-16 LAB — PROTIME-INR
INR: 0.93
Prothrombin Time: 12.4 seconds (ref 11.4–15.2)

## 2017-08-16 LAB — DIFFERENTIAL
Basophils Absolute: 0 10*3/uL (ref 0–0.1)
Basophils Relative: 1 %
Eosinophils Absolute: 0.1 10*3/uL (ref 0–0.7)
Eosinophils Relative: 1 %
Lymphocytes Relative: 44 %
Lymphs Abs: 2.4 10*3/uL (ref 1.0–3.6)
Monocytes Absolute: 0.5 10*3/uL (ref 0.2–0.9)
Monocytes Relative: 10 %
Neutro Abs: 2.4 10*3/uL (ref 1.4–6.5)
Neutrophils Relative %: 44 %

## 2017-08-16 LAB — SEDIMENTATION RATE: Sed Rate: 8 mm/hr (ref 0–30)

## 2017-08-16 LAB — CBC
HCT: 41.3 % (ref 35.0–47.0)
Hemoglobin: 14.2 g/dL (ref 12.0–16.0)
MCH: 31.1 pg (ref 26.0–34.0)
MCHC: 34.4 g/dL (ref 32.0–36.0)
MCV: 90.5 fL (ref 80.0–100.0)
Platelets: 272 10*3/uL (ref 150–440)
RBC: 4.56 MIL/uL (ref 3.80–5.20)
RDW: 13.2 % (ref 11.5–14.5)
WBC: 5.5 10*3/uL (ref 3.6–11.0)

## 2017-08-16 LAB — C-REACTIVE PROTEIN: CRP: <0.8 mg/dL (ref <1.0)

## 2017-08-16 LAB — APTT: aPTT: 27 seconds (ref 24–36)

## 2017-08-16 LAB — TSH: TSH: 1.409 u[IU]/mL (ref 0.350–4.500)

## 2017-08-16 LAB — TROPONIN I: Troponin I: <0.03 ng/mL (ref <0.03)

## 2017-08-16 LAB — GLUCOSE, CAPILLARY: Glucose-Capillary: 98 mg/dL (ref 65–99)

## 2017-08-16 MED ORDER — ALPRAZOLAM 0.25 MG PO TABS: ORAL_TABLET | ORAL | 0 refills | Status: DC

## 2017-08-16 NOTE — Telephone Encounter (Signed)
Received phone call from [facility] ED physician today, patient is negative for stroke. She will need follow up for labs that they will be drawing today. Please schedule her for ED follow up visit, needs 30 minute slot. Patient also approved by insurance for MRI of her back which will be scheduled. Approval number placed on Marion's desk.

## 2017-08-16 NOTE — ED Provider Notes (Addendum)
[facility] Emergency Department Provider Note  ____________________________________________   I have reviewed the triage vital signs and the nursing notes.   HISTORY  Chief Complaint Code Stroke    HPI Samantha Hall is a 56 y.o. female who presents today complaining of tingling. Patient is essentially been tingling for last 2-3 days. In various parts of her body over that time legs, Ace, arms etc. she denies any focal weakness. She did have a negative MRI 2 days ago for the same symptoms really never completely went away. Patient states she is under a great deal stress patient has chronic low back pain which is making her for her work. She denies any incontinence of bowel or bladder. She denies any fever or chills. She has actually no focal weakness she just has tingling that seems to be going in various different directions over the last several days. I have reviewed the patient's MRI from 2 days ago which was reassuring as is her CT and blood work. Patient is she states under some stress. She states today that tingling got somewhat worse in her neck and face more on the left than the right starting around 10:00. Patient denies any weakness.    Past Medical History:  Diagnosis Date  . Elevated antinuclear antibody (ANA) level    Negative workup for Lupus.  . Hyperlipidemia   . Hypertension   . Osteoarthritis   . Sciatica   . Vitamin D deficiency     Patient Active Problem List   Diagnosis Date Noted  . Osteoarthritis 02/02/2017  . Preventative health care 02/02/2017  . Hyperlipidemia 02/02/2017  . Essential hypertension 11/22/2015    Past Surgical History:  Procedure Laterality Date  . ABDOMINAL HYSTERECTOMY  2002  . CHOLECYSTECTOMY  2015  . TUBAL LIGATION  1984    Prior to Admission medications   Medication Sig Start Date End Date Taking? Authorizing Provider  amLODipine (NORVASC) 5 MG tablet Take 1 tablet (5 mg total) by mouth daily.  02/02/17   Pleas Koch, NP  cyclobenzaprine (FLEXERIL) 10 MG tablet Take 10 mg by mouth 3 (three) times daily as needed for muscle spasms.    [provider]  HYDROcodone-acetaminophen (NORCO/VICODIN) 5-325 MG tablet Take 1 tablet by mouth every 6 (six) hours as needed for moderate pain.    [provider]  ibuprofen (ADVIL,MOTRIN) 800 MG tablet Take 1 tablet (800 mg total) by mouth every 8 (eight) hours as needed for moderate pain. 08/01/17   Pleas Koch, NP  TURMERIC PO Take 4 capsules by mouth daily.    [provider]    Allergies Cefazolin and Sulfa antibiotics  No family history on file.  Social History Social History  Substance Use Topics  . Smoking status: Never Smoker  . Smokeless tobacco: Never Used  . Alcohol use 0.0 oz/week     Comment: sociall    Review of Systems Constitutional: No fever/chills Eyes: No visual changes. ENT: No sore throat. No stiff neck no neck pain Cardiovascular: Denies chest pain. Respiratory: Denies shortness of breath. Gastrointestinal:   no vomiting.  No diarrhea.  No constipation. Genitourinary: Negative for dysuria. Musculoskeletal: Negative lower extremity swelling Skin: Negative for rash. Neurological: Negative for severe headaches, focal weakness    ____________________________________________   PHYSICAL EXAM:  VITAL SIGNS: ED Triage Vitals  Enc Vitals Group     BP 08/16/17 1206 (!) 155/70     Pulse Rate 08/16/17 1206 65     Resp 08/16/17  1206 12     Temp 08/16/17 1206 98.1 F (36.7 C)     Temp Source 08/16/17 1206 Oral     SpO2 08/16/17 1206 100 %     Weight 08/16/17 1207 170 lb (77.1 kg)     Height 08/16/17 1207 _0  (1.651 m)     Head Circumference --      Peak Flow --      Pain Score 08/16/17 1202 6     Pain Loc --      Pain Edu? --      Excl. in [place]? --     Constitutional: Alert and oriented. Well appearing and in no acute distress.sheis somewhat anxious Eyes: Conjunctivae  are normal Head: Atraumatic HEENT: No congestion/rhinnorhea. Mucous membranes are moist.  Oropharynx non-erythematous Neck:   Nontender with no meningismus, no masses, no stridor Cardiovascular: Normal rate, regular rhythm. Grossly normal heart sounds.  Good peripheral circulation. Respiratory: Normal respiratory effort.  No retractions. Lungs CTAB. Abdominal: Soft and nontender. No distention. No guarding no rebound Back:  There is no focal tenderness or step off.  there is no midline tenderness there are no lesions noted. there is no CVA tenderness Musculoskeletal: No lower extremity tenderness, no upper extremity tenderness. No joint effusions, no DVT signs strong distal pulses no edema Neurologic:  Cranial nerves II through XII are grossly intactwith the exception of very slight subjective difference in sensation on the left face which is variable, also the left trapezius muscle seems to be experiencing similar symptoms although her sensation is intact 2 point discrimination is normal subjectively, there is some difference patient reports. 5 out of 5 strength bilateral upper and lower extremity. Finger to nose within normal limits heel to shin within normal limits, speech is normal with no word finding difficulty or dysarthria, reflexes symmetric, pupils are equally round and reactive to light, there is no pronator drift, sensation is normal, vision is intact to confrontation, gait is deferred, there is no nystagmus, normal neurologic exam  Skin:  Skin is warm, dry and intact. No rash noted. Psychiatric: Mood and affect are nxious. Speech and behavior are normal.  ____________________________________________   LABS (all labs ordered are listed, but only abnormal results are displayed)  Labs Reviewed  GLUCOSE, CAPILLARY  PROTIME-INR  APTT  CBC  DIFFERENTIAL  COMPREHENSIVE METABOLIC PANEL  TROPONIN I  CBG MONITORING, ED   ____________________________________________  EKG  I  personally interpreted any EKGs ordered by me or triage Inus rhythm at 60 bpm no acute ST elevation or depression normal axisunremarkable EKG ____________________________________________  RADIOLOGY  I reviewed any imaging ordered by me or triage that were performed during my shift and, if possible, patient and/or family made aware of any abnormal findings. ____________________________________________   PROCEDURES  Procedure(s) performed: None  Procedures  Critical Care performed: None  ____________________________________________   INITIAL IMPRESSION / ASSESSMENT AND PLAN / ED COURSE  Pertinent labs & imaging results that were available during my care of the patient were reviewed by me and considered in my medical decision making (see chart for details).  Patient here with ongoing tingling for the last couple days. CT san is negative. Neuro exam is normal with the exception of some subjective sensational differences but not objective sensational differences on the left side of the face and part of her neck. Patient was seen and evaluated by Dr. Doy Mince, she was a code stroke. CT is negative. Dr. Doy Mince feels the patient should've a B12 check, and thyroid checked,  sedimentation rate and CRP, for outpatient follow-up. I will talk to her primary care provider W this is happening. There is no evidence of acute CVA at this time neurologically or by imaging or history. Patient is very comfortable with the plan as initiated by Dr. Doy Mince who does not feel that the patient should be admitted to the hospital at this time I agree. We are very reassured by her findings despite 2 CT scans and MRI and several days of somewhat subjective symptoms which seem not to be easily reproducible and very variable. Nothing to suggest cauda equina syndrome, MS is not thought to he likely by neurology given MRI, but again she will  follow-up for this.  ----------------------------------------- 1:10 PM on  08/16/2017 -----------------------------------------  Discussed with NP Carlis Abbott who is currently arranging the patient's outpatient MRI of her lower back. She will follow up on the B12 and TSH sedimentation rate and ESR that was requested by neurology. She understands I will not follow up on that. She is going to arrange outpatient close follow-up in their office for further care. Patient in no acute distress neurologic exam is still reassuring    ____________________________________________   FINAL CLINICAL IMPRESSION(S) / ED DIAGNOSES  Final diagnoses:  None      This chart was dictated using voice recognition software.  Despite best efforts to proofread,  errors can occur which can change meaning.      Schuyler Amor, MD 08/16/17 1305    Schuyler Amor, MD 08/16/17 1310

## 2017-08-16 NOTE — Telephone Encounter (Signed)
Called in medication to the [facility] pharmacy as instructed.

## 2017-08-16 NOTE — ED Notes (Signed)
UA collected. CBG performed and pt taken to CT

## 2017-08-16 NOTE — ED Notes (Signed)
PT left ED at 1410. Delay in discharge from electronic record.

## 2017-08-16 NOTE — Telephone Encounter (Signed)
[facility] Patient Name: Samantha Hall DOB: 01/01/1961 Initial Comment Caller states she wants to speak to a nurse. She is having numbness in her face and her blood pressure is 154/90. She went to the ER on Tuesday but she was told she was fine. She was told to follow up so she want's to check with a nurse. Nurse Assessment Nurse: Samantha Daft, RN, Samantha Hall Date/Time Samantha Hall Time): 08/16/2017 11:24:12 AM Confirm and document reason for call. If symptomatic, describe symptoms. ---Caller states she is having numbness on left side face and her blood pressure is 154/90 just a few minutes ago. Left arm/chest feels funny like numb or tightness earlier, not now. S/S started again 45 min. ago. Left side of face is tight. Some neck tightness now at 2/10. Also, c/o headache, rates now at 2-4/10. -- She went to the ER on Tuesday for numbness in both arms/legs and left side was weak, but she was told she was fine. CT scan and MRI and labs done and negative results. Does the patient have any new or worsening symptoms? ---Yes Will a triage be completed? ---Yes Related visit to physician within the last 2 weeks? ---Yes Does the PT have any chronic conditions? (i.e. diabetes, asthma, etc.) ---Yes List chronic conditions. ---HTN - took an extra Amlodipine to see if that helps her BP this AM; chronic low back pain d/t an new injury where she fell on 07/12/17 Is this a behavioral health or substance abuse call? ---No Guidelines Guideline Title Affirmed Question Affirmed Notes Neurologic Deficit Headache (and neurologic deficit) Final Disposition User Go to ED Now Samantha Daft, RN, Samantha Hall - ED Disagree/Comply: Comply

## 2017-08-16 NOTE — Telephone Encounter (Signed)
[facility] 

## 2017-08-16 NOTE — Telephone Encounter (Signed)
Please call in alprazolam 0.25 mg. Take 1 tablet by mouth 30 minutes prior to MRI. May take second tablet in 1 hour if needed. #2, no refills.

## 2017-08-16 NOTE — ED Notes (Signed)
Pt reports symptoms from 8/28 subsided but then today pt reports sudden onset of left sided facial numbness (started at 10:00am). Pt also reports having left sided neck numbness as well. Pt reports having fallen on July 26th and reports back pain. Pt currently recovery from back injury.

## 2017-08-16 NOTE — ED Notes (Signed)
Pt arrived to Treatment room. Neurologist and ED MD aware of code stroke.

## 2017-08-16 NOTE — ED Triage Notes (Signed)
Patient presents to the ED with left sided facial numbness and left leg numbness x 1 hour.  Patient is also complaining of headache and neck pain.  Patient denies dizziness at this time.  Speech is clear and patient denies confusion.

## 2017-08-16 NOTE — Telephone Encounter (Signed)
Called patient to give her the MRI Appt time and she is requesting some medicine to relax her for the MRI. She is somewhat clostrophobic. She had an MRI today and they gave her a shot to relax her and then she was fine. Please call her in something ,she will check with her pharmacy.

## 2017-08-16 NOTE — Consult Note (Addendum)
Referring Physician: Burlene Arnt    Chief Complaint: Left facial numbness  HPI: Samantha Hall is an 56 y.o. female with a history of recent back injury who reports that two days ago she was seen in the ED after developing paresthesias in all of her extremities that improved to the point that only the LLE was involved.  Left face was involved as well.  Patient was seen in the ED.  Had head CT and MRI that were unremarkable and the patient was sent home.  Patient reports that her symptoms have fluctuated since that time but have not completely resolved.  Today her left facial numbness became worse again and she presented for re-evaluation.  Code stroke was called. Initial NIHSS of 1.    Date last known well: Date: 08/14/2017 Time last known well: Time: 09:30 tPA Given: No: Outside time window  Past Medical History:  Diagnosis Date  . Elevated antinuclear antibody (ANA) level    Negative workup for Lupus.  . Hyperlipidemia   . Hypertension   . Osteoarthritis   . Sciatica   . Vitamin D deficiency     Past Surgical History:  Procedure Laterality Date  . ABDOMINAL HYSTERECTOMY  2002  . CHOLECYSTECTOMY  2015  . TUBAL LIGATION  1984    Family history: Mother with DM.  Father with CAD.    Social History:  reports that she has never smoked. She has never used smokeless tobacco. She reports that she drinks alcohol. She reports that she does not use drugs.  Allergies:  Allergies  Allergen Reactions  . Cefazolin Swelling    Difficulty breathing  . Sulfa Antibiotics     Medications: I have reviewed the patient's current medications. Prior to Admission:  Prior to Admission medications   Medication Sig Start Date End Date Taking? Authorizing Provider  ALPRAZolam (XANAX) 0.25 MG tablet Take 1 tablet by mouth 30 minutes prior to MRI. May take second tablet in 1 hour if needed. 08/16/17   Pleas Koch, NP  amLODipine (NORVASC) 5 MG tablet Take 1 tablet (5 mg total) by mouth daily.  02/02/17   Pleas Koch, NP  cyclobenzaprine (FLEXERIL) 10 MG tablet Take 10 mg by mouth 3 (three) times daily as needed for muscle spasms.    [provider]  HYDROcodone-acetaminophen (NORCO/VICODIN) 5-325 MG tablet Take 1 tablet by mouth every 6 (six) hours as needed for moderate pain.    [provider]  ibuprofen (ADVIL,MOTRIN) 800 MG tablet Take 1 tablet (800 mg total) by mouth every 8 (eight) hours as needed for moderate pain. 08/01/17   Pleas Koch, NP  TURMERIC PO Take 4 capsules by mouth daily.    [provider]     ROS: History obtained from the patient  General ROS: negative for - chills, fatigue, fever, night sweats, weight gain or weight loss Psychological ROS: negative for - behavioral disorder, hallucinations, memory difficulties, mood swings or suicidal ideation Ophthalmic ROS: negative for - blurry vision, double vision, eye pain or loss of vision ENT ROS: negative for - epistaxis, nasal discharge, oral lesions, sore throat, tinnitus or vertigo Allergy and Immunology ROS: negative for - hives or itchy/watery eyes Hematological and Lymphatic ROS: negative for - bleeding problems, bruising or swollen lymph nodes Endocrine ROS: negative for - galactorrhea, hair pattern changes, polydipsia/polyuria or temperature intolerance Respiratory ROS: negative for - cough, hemoptysis, shortness of breath or wheezing Cardiovascular ROS: negative for - chest pain, dyspnea on exertion, edema or irregular heartbeat Gastrointestinal  ROS: negative for - abdominal pain, diarrhea, hematemesis, nausea/vomiting or stool incontinence Genito-Urinary ROS: negative for - dysuria, hematuria, incontinence or urinary frequency/urgency Musculoskeletal ROS: back pain Neurological ROS: as noted in HPI Dermatological ROS: negative for rash and skin lesion changes  Physical Examination: Blood pressure 136/88, pulse 64, temperature 98.1 F (36.7 C), temperature source  Oral, resp. rate 14, height '5\' 5"'  (1.651 m), weight 77.1 kg (170 lb), SpO2 100 %.  HEENT-  Normocephalic, no lesions, without obvious abnormality.  Normal external eye and conjunctiva.  Normal TM's bilaterally.  Normal auditory canals and external ears. Normal external nose, mucus membranes and septum.  Normal pharynx. Cardiovascular- S1, S2 normal, pulses palpable throughout   Lungs- chest clear, no wheezing, rales, normal symmetric air entry Abdomen- soft, non-tender; bowel sounds normal; no masses,  no organomegaly Extremities- no edema Lymph-no adenopathy palpable Musculoskeletal-no joint tenderness, deformity or swelling Skin-warm and dry, no hyperpigmentation, vitiligo, or suspicious lesions  Neurological Examination   Mental Status: Alert, oriented, thought content appropriate.  Speech fluent without evidence of aphasia.  Able to follow 3 step commands without difficulty. Cranial Nerves: II: Discs flat bilaterally; Visual fields grossly normal, pupils equal, round, reactive to light and accommodation III,IV, VI: ptosis not present, extra-ocular motions intact bilaterally V,VII: smile symmetric, facial light touch sensation decreased at the neck area VIII: hearing normal bilaterally IX,X: gag reflex present XI: bilateral shoulder shrug XII: midline tongue extension Motor: Right : Upper extremity   5/5    Left:     Upper extremity   5/5  Lower extremity   5/5     Lower extremity   5/5 Tone and bulk:normal tone throughout; no atrophy noted Sensory: Pinprick and light touch intact throughout, bilaterally Deep Tendon Reflexes: 2+ and symmetric throughout Plantars: Right: downgoing   Left: downgoing Cerebellar: Normal finger-to-nose and normal heel-to-shin testing bilaterally Gait: normal gait and station    Laboratory Studies:  Basic Metabolic Panel:  Recent Labs Lab 08/14/17 1528 08/16/17 1221  NA 136 138  K 3.7 4.1  CL 105 105  CO2 24 26  GLUCOSE 96 101*  BUN 17 15   CREATININE 0.68 0.82  CALCIUM 9.1 9.6    Liver Function Tests:  Recent Labs Lab 08/14/17 1528 08/16/17 1221  AST 33 29  ALT 45 38  ALKPHOS 92 88  BILITOT 0.8 1.0  PROT 7.7 8.1  ALBUMIN 4.1 4.3   No results for input(s): LIPASE, AMYLASE in the last 168 hours. No results for input(s): AMMONIA in the last 168 hours.  CBC:  Recent Labs Lab 08/14/17 1528 08/16/17 1221  WBC 5.2 5.5  NEUTROABS 2.1 2.4  HGB 13.8 14.2  HCT 40.3 41.3  MCV 89.3 90.5  PLT 275 272    Cardiac Enzymes:  Recent Labs Lab 08/14/17 1528 08/16/17 1221  TROPONINI <0.03 <0.03    BNP: Invalid input(s): POCBNP  CBG:  Recent Labs Lab 08/16/17 1209  GLUCAP 98    Microbiology: No results found for this or any previous visit.  Coagulation Studies:  Recent Labs  08/14/17 1528 08/16/17 1221  LABPROT 13.2 12.4  INR 1.00 0.93    Urinalysis: No results for input(s): COLORURINE, LABSPEC, PHURINE, GLUCOSEU, HGBUR, BILIRUBINUR, KETONESUR, PROTEINUR, UROBILINOGEN, NITRITE, LEUKOCYTESUR in the last 168 hours.  Invalid input(s): APPERANCEUR  Lipid Panel:    Component Value Date/Time   CHOL 222 (H) 01/31/2017 0925   TRIG 48.0 01/31/2017 0925   HDL 81.10 01/31/2017 0925   CHOLHDL 3 01/31/2017 0037  VLDL 9.6 01/31/2017 0925   LDLCALC 131 (H) 01/31/2017 0925    HgbA1C: No results found for: HGBA1C  Urine Drug Screen:  No results found for: LABOPIA, COCAINSCRNUR, LABBENZ, AMPHETMU, THCU, LABBARB  Alcohol Level: No results for input(s): ETH in the last 168 hours.  Other results: EKG: sinus rhythm at 68 bpm.  Imaging: Ct Head Wo Contrast  Result Date: 08/14/2017 CLINICAL DATA:  Bilateral leg numbness. Headache and left-sided weakness. EXAM: CT HEAD WITHOUT CONTRAST TECHNIQUE: Contiguous axial images were obtained from the base of the skull through the vertex without intravenous contrast. COMPARISON:  None. FINDINGS: Brain: No evidence of acute infarction, hemorrhage, hydrocephalus,  extra-axial collection or mass lesion/mass effect. Vascular: No hyperdense vessel or unexpected calcification. Skull: Normal. Negative for fracture or focal lesion. Sinuses/Orbits: Visualize globes and orbits are unremarkable. The visualized sinuses and mastoid air cells are clear. Other: None. IMPRESSION: Normal unenhanced CT scan of the brain. Electronically Signed   By: Lajean Manes M.D.   On: 08/14/2017 16:00   Mr Brain Wo Contrast  Result Date: 08/14/2017 CLINICAL DATA:  56 year old female with bilateral extremity paresthesias after water aerobics today, and subsequent left side weakness. EXAM: MRI HEAD WITHOUT CONTRAST TECHNIQUE: Multiplanar, multiecho pulse sequences of the brain and surrounding structures were obtained without intravenous contrast. COMPARISON:  Head CT without contrast 1544 hours today. FINDINGS: Brain: Normal cerebral volume. No restricted diffusion to suggest acute infarction. No midline shift, mass effect, evidence of mass lesion, ventriculomegaly, extra-axial collection or acute intracranial hemorrhage. Cervicomedullary junction and pituitary are within normal limits. Pearline Cables and white matter signal is within normal limits for age throughout the brain. No cortical encephalomalacia or chronic cerebral blood products identified. Vascular: Major intracranial vascular flow voids are preserved. Skull and upper cervical spine: Negative. Sinuses/Orbits: Negative.  Paranasal sinuses are clear. Other: Mastoid air cells are clear. Scalp and face soft tissues appear negative. IMPRESSION: Normal noncontrast MRI appearance of the brain. Electronically Signed   By: Genevie Ann M.D.   On: 08/14/2017 20:10   Ct Head Code Stroke Wo Contrast  Result Date: 08/16/2017 CLINICAL DATA:  Code stroke. Left-sided facial numbness. Left leg numbness. Symptoms for 1 hour. EXAM: CT HEAD WITHOUT CONTRAST TECHNIQUE: Contiguous axial images were obtained from the base of the skull through the vertex without  intravenous contrast. COMPARISON:  Head CT and brain MRI from 2 days ago. FINDINGS: Brain: No evidence of acute or prior infarction, hemorrhage, hydrocephalus, extra-axial collection or mass lesion/mass effect. Vascular: No hyperdense vessel or unexpected calcification. Skull: No acute or aggressive finding Sinuses/Orbits: Negative ASPECTS ([place] Stroke Program Early CT Score) - Ganglionic level infarction (caudate, lentiform nuclei, internal capsule, insula, M1-M3 cortex): 7 - Supraganglionic infarction (M4-M6 cortex): 3 Total score (0-10 with 10 being normal): 10 IMPRESSION: Stable and negative head CT. ASPECTS is 10. Electronically Signed   By: Monte Fantasia M.D.   On: 08/16/2017 12:23    Assessment: 56 y.o. female presenting with left facial numbness.  Symptoms improving in ED.  Ha shad recent work for similar presentation.  Head CT and MRI from that visit reviewed and no acute changes noted.  Do not suspect ischemic disease.  Blood work from February reviewed and shows low Vitamin D and abnormal ANA which is chronic for her.  Head CT repeated today and shows no acute changes.  Further imaging not recommended.    Stroke Risk Factors - hyperlipidemia and hypertension  Plan: 1. B12, ESR, CRP, TSH 2. Compliance stressed for Vitamin D  3.  ASA 68m daily 4. Patient to continue follow up on an outpatient basis  Case discussed with Dr. MGordy Councilman MD Neurology [id number]/30/2018, 1:44 PM

## 2017-08-16 NOTE — Telephone Encounter (Signed)
Caller Name:Anette Ayre Relationship to Hartman:  Reason for call: pt was seen at [facility] ed, and was told to discuss test results with pcp next day.  Pt is wanting to know if she can do this over phone, or if an appt is needed.  If so, when is a good time to schedule the appt for Fri, as I can not find a 30 min slot avail.  Thanks

## 2017-08-16 NOTE — Telephone Encounter (Signed)
Spoke with [facility] ED physician, no stroke. Patient to be discharged later today. Will follow up with patient in the office.

## 2017-08-16 NOTE — Discharge Instructions (Signed)
We have ordered a B12, thyroid, sedimentation rate, and C-reactive protein test all of which your nurse practitioner Ms. Carlis Abbott will follow up on. We are very reassured by your findings here. There is no evidence of acute stroke. Iif you have new or worrisome symptoms including worsening weakness, worsening numbnes difficulty speaking or walking, pain, or other concerns return to the emergency department. Please follow closely with your primary care doctor who is ordering an MRI of the  lower back.take a baby aspirin every day as directed by neurology here.

## 2017-08-17 NOTE — Telephone Encounter (Signed)
Spoken and notified patient of Kate's comments. Patient verbalized understanding.  Patient stated that she does not have any anxiety and no need for an office visit.

## 2017-08-17 NOTE — Telephone Encounter (Signed)
Please notify patient that all of her labs that were drawn in the emergency department were normal. Thyroid was normal and there was no evidence inflammatory disorders. I recommend she move forward with the MRI and follow-up with neurosurgery as scheduled. The emergency department physician suggested that her symptoms were due to anxiety. If she's feeling anxious/overwhelmed that I'm happy to see her to discuss.

## 2017-08-17 NOTE — Telephone Encounter (Signed)
This is also addressed in another telephone encounter.

## 2017-08-17 NOTE — Telephone Encounter (Signed)
Noted  

## 2017-08-17 NOTE — Telephone Encounter (Addendum)
Per DPR, left detail message of Kate's comments for patient to call back on 08/16/2017

## 2017-08-17 NOTE — Telephone Encounter (Signed)
Will forward this message to Corn.

## 2017-08-17 NOTE — Telephone Encounter (Signed)
Disregard phone note. I replied to patient through my chart.

## 2017-08-18 LAB — METHYLMALONIC ACID, SERUM: Methylmalonic Acid, Quantitative: 124 nmol/L (ref 0–378)

## 2017-08-21 ENCOUNTER — Ambulatory Visit
Admission: RE | Admit: 2017-08-21 | Discharge: 2017-08-21 | Disposition: A | Discharge status: Home / Self Care | Payer: 59 | Source: Ambulatory Visit | Attending: Primary Care | Admitting: Primary Care

## 2017-08-21 DIAGNOSIS — M5441 Lumbago with sciatica, right side: Secondary | ICD-10-CM | POA: Diagnosis present

## 2017-08-21 DIAGNOSIS — M4687 Other specified inflammatory spondylopathies, lumbosacral region: Secondary | ICD-10-CM | POA: Insufficient documentation

## 2017-08-21 DIAGNOSIS — M48061 Spinal stenosis, lumbar region without neurogenic claudication: Secondary | ICD-10-CM | POA: Insufficient documentation

## 2017-08-21 DIAGNOSIS — M4686 Other specified inflammatory spondylopathies, lumbar region: Secondary | ICD-10-CM | POA: Diagnosis not present

## 2017-08-23 ENCOUNTER — Telehealth (INDEPENDENT_AMBULATORY_CARE_PROVIDER_SITE_OTHER): Payer: Self-pay | Admitting: Orthopaedic Surgery

## 2017-08-23 ENCOUNTER — Encounter (INDEPENDENT_AMBULATORY_CARE_PROVIDER_SITE_OTHER): Payer: Self-pay | Admitting: Orthopaedic Surgery

## 2017-08-23 ENCOUNTER — Telehealth (INDEPENDENT_AMBULATORY_CARE_PROVIDER_SITE_OTHER): Payer: Self-pay

## 2017-08-23 ENCOUNTER — Ambulatory Visit (INDEPENDENT_AMBULATORY_CARE_PROVIDER_SITE_OTHER): Payer: 59 | Admitting: Orthopaedic Surgery

## 2017-08-23 DIAGNOSIS — M545 Low back pain: Secondary | ICD-10-CM

## 2017-08-23 DIAGNOSIS — G8929 Other chronic pain: Secondary | ICD-10-CM

## 2017-08-23 NOTE — Telephone Encounter (Signed)
Called patient  to advise [facility] ,  therapist will let her know how often to do PT when she goes to her first visit.Marland Kitchen

## 2017-08-23 NOTE — Telephone Encounter (Signed)
Patient called needing to know how often she will be going to (PT) The number to contact patient is [phone]

## 2017-08-23 NOTE — Telephone Encounter (Signed)
Patient was seen this morning she said you want her to do PT and have an injection done?  Please advise

## 2017-08-23 NOTE — Progress Notes (Signed)
Office Visit Note   Patient: Samantha Hall           Date of Birth: [birth year]           MRN: [id number] Visit Date: 08/23/2017              Requested by: Pleas Koch, NP [place], [place] [address] PCP: Pleas Koch, NP   Assessment & Plan: Visit Diagnoses:  1. Chronic bilateral low back pain, with sciatica presence unspecified     Plan: MRI of her lumbar spine shows mild facet disease and a mild central bulging disc with mild lateral recess stenosis. These results were reviewed with the patient. In my opinion I think these are chronic and degenerative changes rather than acute findings.  I did offer to set her up with [facility] to see if this helps.  Prescription for PT.  Work from home for 6 weeks so see can do PT.  May return to full duty after 6 weeks.  F/u prn. Total face to face encounter time was greater than 45 minutes and over half of this time was spent in counseling and/or coordination of care.  Follow-Up Instructions: Return if symptoms worsen or fail to improve.   Orders:  Orders Placed This Encounter  Procedures  . Ambulatory referral to Physical Medicine Rehab   No orders of the defined types were placed in this encounter.     Procedures: No procedures performed   Clinical Data: No additional findings.   Subjective: Chief Complaint  Patient presents with  . Lower Back - Pain    Patient is a 56 year old female comes  Back and neck pain with occasional radiation of pain down her arms and legs. She endorses occasional numbness. He states this started after she took a mechanical fall. She denies any constitutional symptoms     Review of Systems  Constitutional: Negative.   HENT: Negative.   Eyes: Negative.   Respiratory: Negative.   Cardiovascular: Negative.   Endocrine: Negative.   Musculoskeletal: Negative.   Neurological: Negative.   Hematological: Negative.   Psychiatric/Behavioral: Negative.   All other systems  reviewed and are negative.    Objective: Vital Signs: There were no vitals taken for this visit.  Physical Exam  Constitutional: She is oriented to person, place, and time. She appears well-developed and well-nourished.  HENT:  Head: Normocephalic and atraumatic.  Eyes: EOM are normal.  Neck: Neck supple.  Pulmonary/Chest: Effort normal.  Abdominal: Soft.  Neurological: She is alert and oriented to person, place, and time.  Skin: Skin is warm. Capillary refill takes less than 2 seconds.  Psychiatric: She has a normal mood and affect. Her behavior is normal. Judgment and thought content normal.  Nursing note and vitals reviewed.   Ortho Exam Bilateral lower extremity exam is nonfocal Symmetric reflexes. Low back is nontender. Specialty Comments:  No specialty comments available.  Imaging: No results found.   PMFS History: Patient Active Problem List   Diagnosis Date Noted  . Osteoarthritis 02/02/2017  . Preventative health care 02/02/2017  . Hyperlipidemia 02/02/2017  . Essential hypertension 11/22/2015   Past Medical History:  Diagnosis Date  . Elevated antinuclear antibody (ANA) level    Negative workup for Lupus.  . Hyperlipidemia   . Hypertension   . Osteoarthritis   . Sciatica   . Vitamin D deficiency     No family history on file.  Past Surgical History:  Procedure Laterality Date  . ABDOMINAL  HYSTERECTOMY  2002  . CHOLECYSTECTOMY  2015  . TUBAL LIGATION  1984   Social History   Occupational History  . Not on file.   Social History Main Topics  . Smoking status: Never Smoker  . Smokeless tobacco: Never Used  . Alcohol use 0.0 oz/week     Comment: sociall  . Drug use: No  . Sexual activity: Not on file

## 2017-08-24 ENCOUNTER — Encounter: Payer: Self-pay | Admitting: Primary Care

## 2017-08-27 ENCOUNTER — Telehealth: Payer: Self-pay | Admitting: Primary Care

## 2017-08-27 NOTE — Telephone Encounter (Signed)
error 

## 2017-08-28 ENCOUNTER — Telehealth: Payer: Self-pay | Admitting: Primary Care

## 2017-08-28 NOTE — Telephone Encounter (Signed)
Pt dropped off fmla paperwork I spoke with her She wanted continuous leave from 08/27/17 to 09/10/17 Then she wanted intermittent leave from 09/10/17 to 03/10/17 for dr appointments  She wanted reduced schedule while she was taking PT  She has consultation for PT 9/13 She wanted reduce schedule  Form 09/10/17 to 10/22/17 for 4 hours a day for the PT with light duty  In kate's in box For review answer and signature

## 2017-08-28 NOTE — Telephone Encounter (Signed)
I put it in your in box this morning

## 2017-08-28 NOTE — Telephone Encounter (Signed)
Form completed and placed on Robin's desk. 

## 2017-08-28 NOTE — Telephone Encounter (Signed)
Hi Samantha Hall, have you seen FMLA paper work for this patient?  You can message or call her for any questions.

## 2017-08-29 NOTE — Telephone Encounter (Signed)
Copy for file °Copy for scan °Copy for pt °

## 2017-08-29 NOTE — Telephone Encounter (Signed)
Paperwork faxed Pt aware  

## 2017-08-30 ENCOUNTER — Encounter: Payer: Self-pay | Admitting: Physical Therapy

## 2017-08-30 ENCOUNTER — Ambulatory Visit: Payer: 59 | Attending: Primary Care | Admitting: Physical Therapy

## 2017-08-30 DIAGNOSIS — R262 Difficulty in walking, not elsewhere classified: Secondary | ICD-10-CM | POA: Diagnosis present

## 2017-08-30 DIAGNOSIS — M5441 Lumbago with sciatica, right side: Secondary | ICD-10-CM

## 2017-08-30 DIAGNOSIS — M6281 Muscle weakness (generalized): Secondary | ICD-10-CM | POA: Diagnosis present

## 2017-08-30 NOTE — Therapy (Signed)
[place] MAIN [facility] SERVICES [address] [place], [place], [address] Phone: [phone]   Fax:  [phone]  Physical Therapy Evaluation  Patient Details  Name: Samantha Hall MRN: [id number] Date of Birth: [birth year] Referring Provider: Alma Friendly NP  Encounter Date: 08/30/2017      PT End of Session - 08/30/17 1857    Visit Number 1   Number of Visits 17   Date for PT Re-Evaluation 10/25/17   PT Start Time 0805   PT Stop Time 0910   PT Time Calculation (min) 65 min   Activity Tolerance Patient tolerated treatment well;Patient limited by pain   Behavior During Therapy [facility] for tasks assessed/performed      Past Medical History:  Diagnosis Date  . Elevated antinuclear antibody (ANA) level    Negative workup for Lupus.  . Hyperlipidemia   . Hypertension    Controlled  . Osteoarthritis   . Sciatica   . Vitamin D deficiency     Past Surgical History:  Procedure Laterality Date  . ABDOMINAL HYSTERECTOMY  2002  . CHOLECYSTECTOMY  2015  . TUBAL LIGATION  1984    There were no vitals filed for this visit.       Subjective Assessment - 08/30/17 0808    Subjective PT reports that she has been feeling better this past week. At worst her pain has been up to 5/10 in last 24 hours in the low back bilaterally with minimal radicular pain. Pt denies pain at this time.   Pertinent History Pt had a fall on 7/26 without apparent injury; shortly after she began having severe LBP the same day. She reports that she tripped after stepping in a hole while walking in gravel outside a restaurant. She fell onto her hands and knees and did not notice and injury initially. She has been experience numbness and tingling in BUEs and in [place] since. She reports she has never had this type of pain before, though she has had sciatica in the past and was treated by her chiropractor with success. Pain is alleviated with sitting and elevating feet. Pt  reports she is currently able to tolerate standing activities longer without unbearable pain. She reports after several hours she has pain that and progresses to numbness in BLE, but greater in posterior RLE.   Limitations Standing;Walking;House hold activities;Lifting;Sitting   How long can you sit comfortably? Unlimited unless already in pain   How long can you stand comfortably? several hours   How long can you walk comfortably? 4-5 hours, has to sit after onset of pain   Diagnostic tests MRI, [place] [facility], 08/23/17: IMPRESSION: At L4-5 there is a minimal broad-based disc bulge. Mild    Currently in Pain? No/denies   Multiple Pain Sites No            [facility] PT Assessment - 08/30/17 0001      Assessment   Medical Diagnosis Low back pain with right sided sciatica   Referring Provider Alma Friendly NP   Onset Date/Surgical Date 07/12/17   Hand Dominance Right   Next MD Visit Not scheduled   Prior Therapy Has had PT for sciatica in the past with good outcome. Pt has seen chiropractor for sciatica in the past.      Precautions   Precautions None     Restrictions   Weight Bearing Restrictions No     Balance Screen   Has the patient fallen in the past 6 months Yes  How many times? 1   Has the patient had a decrease in activity level because of a fear of falling?  No   Is the patient reluctant to leave their home because of a fear of falling?  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   Leisure Extended sitting and standing      Cognition   Overall Cognitive Status Within Functional Limits for tasks assessed         PROM/AROM:  Upper Extremity:  Lower extremity: LUMBAR AROM in standing       Norm values Flexion  25 deg   60 deg Extension 5 deg   25 deg  Lateral flexion  10 deg bilaterally 25 deg      STRENGTH:  Graded on a 0-5 scale Muscle Group Left Right  Gross Upper Extremity 4 4  Hip Flex 4 3+ with pain  Hip Abd 4+ 4  Hip Add 4+ 4+  Hip Ext    Hip IR/ER    Knee Flex 5 4-  Knee Ext 5 4-  Ankle DF 5 4-  Ankle PF     Sensory:  Light touch: No decreased sensation to light touch. Pt reports NT in BLEs and BUEs in inconsistent patterns. Pt had numbness in 2nd and 3rd toes on R following slump test and SLR on R.      Coordination:   Unimpaired by gross functional assessment.  Observations:   Posture: Pt shifts posture constantly due to discomfort. She does not appear to have any gross abnormality. In sitting or standing.   Lumbar paraspinals with increased tone;  Pt with slow antalgic movement with bed mobility requiring cuing and HHA to transfer off mat table from prone.    BALANCE: Static Standing Balance  Normal Able to maintain standing balance against maximal resistance X  Good Able to maintain standing balance against moderate resistance   Good-/Fair+ Able to maintain standing balance against minimal resistance   Fair Able to stand unsupported without UE support and without LOB for 1-2 min   Fair- Requires Min A and UE support to maintain standing without loss of balance   Poor+ Requires mod A and UE support to maintain standing without loss of balance   Poor Requires max A and UE support to maintain standing balance without loss     Standing Dynamic Balance  Normal Stand independently unsupported, able to weight shift and cross midline maximally   Good Stand independently unsupported, able to weight shift and cross midline moderately X  Good-/Fair+ Stand independently  unsupported, able to weight shift across midline minimally   Fair Stand independently unsupported, weight shift, and reach ipsilaterally, loss of balance when crossing midline   Poor+ Able to stand with Min A and reach ipsilaterally, unable to weight shift   Poor+ Able to stand with Mod A and minimally reach ipsilaterally, unable to cross midline.       8 SPECIAL TESTS:     Slump: + for pain  in gluteal area bilaterally  SLR: + R with LBP and numbness in 2nd and 3rd toe on R   Palpation: Increased muscle tone in lumbar paraspinals with apparent muscle guarding in prone  Repeated extension: pain centralized to buttocks   FUNCTIONAL MOBILITY:   Transfers: Pt uses armrests to stand, but is able to stand without UE support with increased difficulty. Weight is shifted slightly to the left LE   Gait: Pt ambulated with short stride length and decreased pelvic rotation; trunk is antalgically stiff with decreased pelvic motion.    OUTCOME MEASURES: TEST Outcome Interpretation  Oswestry  52/100 Pt is 52% disabled based on her perception of her low back pain.                               Objective measurements completed on examination: See above findings.      Treatment:  Ther-ex:  Prone   Repeated lumbar extension 2 x10 with manual overpressure; pt centralized with no numbness and pain up to buttocks. Cues for isolation of movement and to maintain cervical spine in neutral position.    Pt instructed to perform standing lumbar extension, see pt instructions.         PT Education - 08/30/17 1857    Education provided Yes   Education Details Plan of care, HEP initiated, ther-ex   Person(s) Educated Patient   Methods Explanation;Demonstration;Tactile cues;Verbal cues;Handout   Comprehension Verbal cues required;Tactile cues required;Returned demonstration;Verbalized understanding          PT Short Term Goals - 08/30/17 1925      PT SHORT TERM GOAL #1   Title  Pt will show improved lumbar AROM by 6 degrees in all directions in order to improve body mechanics and decrease pain with functional mobility.    Time 4   Period Weeks   Status New   Target Date 09/27/17     PT SHORT TERM GOAL #2   Title Pt will perform HEP at least 3/7 days/wk in order to show meaningful improvement in strength and AROM for improved functional mobility and decreased pain.   Time 4   Period Weeks   Status New   Target Date 09/27/17     PT SHORT TERM GOAL #3   Target Date 10/25/17           PT Long Term Goals - 08/30/17 1930      PT LONG TERM GOAL #1   Title Patient will be independent in home exercise program to improve strength/mobility for better functional independence with ADLs.   Time 8   Period Weeks   Status New   Target Date 10/25/17     PT LONG TERM GOAL #2   Title Patient will increase BLE gross strength to 4+/5 as to improve functional strength for independent gait, increased standing tolerance and increased ADL ability.   Time 8   Period Weeks   Status New   Target Date 10/25/17     PT LONG TERM GOAL #3   Title Patient will report a worst pain of 3/10 on VAS in low back to improve tolerance with ADLs and reduced symptoms with activities.    Time 8   Period Weeks   Status New   Target Date 10/25/17     PT LONG TERM GOAL #4   Title  Patient will reduce Oswestry score to <20 as to demonstrate minimal disability with ADLs including improved  sleeping tolerance, walking/sitting tolerance etc for better mobility with ADLs.    Baseline 52/100: Pt is 52% disabled based on her perception of her low back pain.   Time 8   Period Weeks   Status New   Target Date 10/25/17     PT LONG TERM GOAL #5   Title Pt will show improved lumbar AROM by 12 degrees in all directions in order to improve body mechanics and decrease pain with functional mobility.    Time 8   Period Weeks   Status New   Target Date 10/25/17                Plan -  08/30/17 1859    Clinical Impression Statement 56 y/o female presents to therapy with LBP with radicular symptoms in RLE down to the foot. Pain began on 7/17 after tripping with a fall onto hands and knees. Pt was assessed with the Oswestry, which indicates that pt has self-perceived 52% disability due to LBP. Pt was positive on slump test bilaterally and SLR on R which caused numbness in 2nd and 3rd toes in L5 dermatome distribution. Pt centralized with repeated extension in prone with only gluteal pain with decreased intensity. HEP initiated with repeated extension exercise. Pt has decreased strength bilaterally, but weaker on R compared to L, and hypomobility in all lumbar AROM. Pt will benefit from continued therapy to address strength, ROM, and pain deficits in order to maximize pt's functional mobility and decrease her pain.   History and Personal Factors relevant to plan of care: (-) Radicular symptoms below the knee, high perceived disability (+) relatively young in age, high PLOF, low fall risk despite recent fall   Clinical Presentation Evolving   Clinical Presentation due to: Radicular symptoms below the knee   Clinical Decision Making Moderate   Rehab Potential Good   Clinical Impairments Affecting Rehab Potential High perceived disability despite reports of pain improving   PT Frequency 2x / week   PT Duration 8 weeks   PT Treatment/Interventions ADLs/Self Care Home Management;Biofeedback;Moist Heat;Electrical Stimulation;Cryotherapy;Gait training;Therapeutic exercise;Therapeutic activities;Functional mobility training;Stair training;Neuromuscular re-education;Patient/family education;Manual techniques;Passive range of motion;Dry needling;Energy conservation   PT Next Visit Plan Further assess core strength, advance HEP with general LE strengthening and extension based core strengthening exercise   PT Home Exercise Plan Repeated lumbar extension in prone and standing   Consulted and Agree  with Plan of Care Patient      Patient will benefit from skilled therapeutic intervention in order to improve the following deficits and impairments:  Abnormal gait, Decreased activity tolerance, Decreased mobility, Decreased range of motion, Decreased strength, Difficulty walking, Hypomobility, Increased muscle spasms, Impaired perceived functional ability, Impaired flexibility, Impaired tone, Impaired sensation, Improper body mechanics, Pain  Visit Diagnosis: Low back pain with right-sided sciatica, unspecified back pain laterality, unspecified chronicity  Muscle weakness (generalized)  Difficulty in walking, not elsewhere classified     Problem List Patient Active Problem List   Diagnosis Date Noted  . Osteoarthritis 02/02/2017  . Preventative health care 02/02/2017  . Hyperlipidemia 02/02/2017  . Essential hypertension 11/22/2015   Burnett Corrente, SPT This entire session was performed under direct supervision and direction of a licensed therapist/therapist assistant . I have personally read, edited and approve of the note as written.  Trotter,Margaret PT, DPT 08/30/2017, 7:39 PM  [place] MAIN [facility] SERVICES [address] [place], [place], [address] Phone: [phone]   Fax:  [phone]  Name: Samantha Hall MRN:  [id number] Date of Birth: 1961/02/02

## 2017-08-30 NOTE — Patient Instructions (Addendum)
Back Hyperextension: Using Arms    Lying face down with arms bent, inhale. Then while exhaling, straighten arms. Hold ___3_ seconds. Slowly return to starting position. Repeat __10__ times per set. Do __2-3__ sets per session. Do _3-4___ sessions per day.  Copyright  VHI. All rights reserved.     Alternatively perform standing extension in the same manner in sets of 10 -4 times per day as instructed during therapy session.

## 2017-09-04 ENCOUNTER — Encounter: Payer: Self-pay | Admitting: Physical Therapy

## 2017-09-04 ENCOUNTER — Encounter: Payer: Self-pay | Admitting: Primary Care

## 2017-09-04 ENCOUNTER — Ambulatory Visit: Payer: 59 | Admitting: Physical Therapy

## 2017-09-04 DIAGNOSIS — M6281 Muscle weakness (generalized): Secondary | ICD-10-CM

## 2017-09-04 DIAGNOSIS — R262 Difficulty in walking, not elsewhere classified: Secondary | ICD-10-CM

## 2017-09-04 DIAGNOSIS — M5441 Lumbago with sciatica, right side: Secondary | ICD-10-CM | POA: Diagnosis not present

## 2017-09-04 NOTE — Therapy (Signed)
[place] MAIN [facility] SERVICES [address] [place], [place], [address] Phone: [phone]   Fax:  [phone]  Physical Therapy Treatment  Patient Details  Name: Samantha Hall MRN: [id number] Date of Birth: Feb 24, 1961 Referring Provider: Alma Friendly NP  Encounter Date: 09/04/2017      PT End of Session - 09/04/17 2019    Visit Number 2   Number of Visits 17   Date for PT Re-Evaluation 10/25/17   PT Start Time 3710   PT Stop Time 1515   PT Time Calculation (min) 43 min   Equipment Utilized During Treatment --  TENS unit   Activity Tolerance Patient tolerated treatment well;Patient limited by pain   Behavior During Therapy [facility] for tasks assessed/performed      Past Medical History:  Diagnosis Date  . Elevated antinuclear antibody (ANA) level    Negative workup for Lupus.  . Hyperlipidemia   . Hypertension    Controlled  . Osteoarthritis   . Sciatica   . Vitamin D deficiency     Past Surgical History:  Procedure Laterality Date  . ABDOMINAL HYSTERECTOMY  2002  . CHOLECYSTECTOMY  2015  . TUBAL LIGATION  1984    There were no vitals filed for this visit.      Subjective Assessment - 09/04/17 1436    Subjective PT reports that she has been pretty good lately with only intermittant pain after prolonged walking. Today pt went shopping and started feeling pain after about 30 min of walking    Pertinent History Pt had a fall on 7/26 without apparent injury; shortly after she began having severe LBP the same day. She reports that she tripped after stepping in a hole while walking in gravel outside a restaurant. She fell onto her hands and knees and did not notice and injury initially. She has been experience numbness and tingling in BUEs and in [place] since. She reports she has never had this type of pain before, though she has had sciatica in the past and was treated by her chiropractor with success. Pain is alleviated with  sitting and elevating feet. Pt reports she is currently able to tolerate standing activities longer without unbearable pain. She reports after several hours she has pain that and progresses to numbness in BLE, but greater in posterior RLE.   Limitations Standing;Walking;House hold activities;Lifting;Sitting   How long can you sit comfortably? Unlimited unless already in pain   How long can you stand comfortably? several hours   How long can you walk comfortably? 4-5 hours, has to sit after onset of pain   Diagnostic tests MRI, [place] spine, 08/23/17: IMPRESSION: At L4-5 there is a minimal broad-based disc bulge. Mild    Currently in Pain? Yes   Pain Score 5    Pain Location Back   Pain Orientation Left;Right   Pain Descriptors / Indicators Aching   Pain Type Chronic pain   Pain Radiating Towards R buttocks   Pain Onset More than a month ago   Pain Frequency Intermittent   Aggravating Factors  Worse with prolonged walking   Pain Relieving Factors Rest    Effect of Pain on Daily Activities Decreased activity tolerance in standing       Treatment:  Educated pt on sitting posture to prevent sacral sitting; cues to use lumbar support to facilitate better sitting posture without excessive lumbar muscle fatigue.   Education for avoiding flexion based postures and to discontinue yoga temporarily until pain improves.  Ther-ex:   Supine:   Glut med stretch 2 x 30 sec on RLE; stretch no performed on LLE due to reproduction of pt's pain.   Hamstring stretch attempted in 90 deg hip flexion, but discontinued due to LBP  Prone   Ice applied across lumbar back with pt in prone x 20 min                Repeated lumbar extension with press-up 2x 5 with therapist overpressure to upper lumbar (L1/2) and then 2nd set with overpressure to L3/4 to facilitate better lumbar extension;   TENS applied in supine on MOD 1 with intensity set to 3 on each channel x15 min (x5 min with rest and x10 min  concurrent with prone press-ups for better tolerance with extension exercise); electrodes applied over lumbar paraspinal bilaterally and to superior glutes distal to PSIS. Pt reported no pain with TENS. Pt reported decreased LBP after several minutes.   Repeated lumbar extension in prone 4 X 10 throughout session with no therapist assist; and cues to relax lumbar and thoracic extensors for better extension. Pt had improved extension AROM by gross assessment at end of session      Pt with no pain following treatment. She denies any RLE radicular pain; Reinforced HEP with importance of lumbar extension;          PT Education - 09/04/17 2018    Education provided Yes   Education Details HEP re-educated, instructions to avoid lumbar flexion   Person(s) Educated Patient   Methods Explanation;Tactile cues;Verbal cues;Demonstration   Comprehension Returned demonstration;Verbal cues required;Verbalized understanding;Tactile cues required          PT Short Term Goals - 08/30/17 1925      PT SHORT TERM GOAL #1   Title Pt will show improved lumbar AROM by 6 degrees in all directions in order to improve body mechanics and decrease pain with functional mobility.    Time 4   Period Weeks   Status New   Target Date 09/27/17     PT SHORT TERM GOAL #2   Title Pt will perform HEP at least 3/7 days/wk in order to show meaningful improvement in strength and AROM for improved functional mobility and decreased pain.   Time 4   Period Weeks   Status New   Target Date 09/27/17     PT SHORT TERM GOAL #3   Target Date --           PT Long Term Goals - 08/30/17 1930      PT LONG TERM GOAL #1   Title Patient will be independent in home exercise program to improve strength/mobility for better functional independence with ADLs.   Time 8   Period Weeks   Status New   Target Date 10/25/17     PT LONG TERM GOAL #2   Title Patient will increase BLE gross strength to 4+/5 as to improve  functional strength for independent gait, increased standing tolerance and increased ADL ability.   Time 8   Period Weeks   Status New   Target Date 10/25/17     PT LONG TERM GOAL #3   Title Patient will report a worst pain of 3/10 on VAS in low back to improve tolerance with ADLs and reduced symptoms with activities.    Time 8   Period Weeks   Status New   Target Date 10/25/17     PT LONG TERM GOAL #4   Title  Patient  will reduce Oswestry score to <20 as to demonstrate minimal disability with ADLs including improved sleeping tolerance, walking/sitting tolerance etc for better mobility with ADLs.    Baseline 52/100: Pt is 52% disabled based on her perception of her low back pain.   Time 8   Period Weeks   Status New   Target Date 10/25/17     PT LONG TERM GOAL #5   Title Pt will show improved lumbar AROM by 12 degrees in all directions in order to improve body mechanics and decrease pain with functional mobility.    Time 8   Period Weeks   Status New   Target Date 10/25/17               Plan - 09/04/17 2020    Clinical Impression Statement Pt presented to therapy today with LBP 5/10 after prolonged walking while shopping. Pt was unable to tolerate light stretching and was instructed to perform repeated extension in prone with TENS and ice applied to low back. Pt's pain gradually reduced throughout session with pt reporting no pain by the end of session. Pt was re-educated in prone extension based exercise. Pt will benefit from continued skilled therapy in order to reduce her LBP and improve her functional mobility   Rehab Potential Good   Clinical Impairments Affecting Rehab Potential High perceived disability despite reports of pain improving   PT Frequency 2x / week   PT Duration 8 weeks   PT Treatment/Interventions ADLs/Self Care Home Management;Biofeedback;Moist Heat;Electrical Stimulation;Cryotherapy;Gait training;Therapeutic exercise;Therapeutic activities;Functional  mobility training;Stair training;Neuromuscular re-education;Patient/family education;Manual techniques;Passive range of motion;Dry needling;Energy conservation;Traction   PT Next Visit Plan Further assess core strength, advance HEP with general LE strengthening and extension based core strengthening exercise   PT Home Exercise Plan Repeated lumbar extension in prone and standing   Consulted and Agree with Plan of Care Patient      Patient will benefit from skilled therapeutic intervention in order to improve the following deficits and impairments:  Abnormal gait, Decreased activity tolerance, Decreased mobility, Decreased range of motion, Decreased strength, Difficulty walking, Hypomobility, Increased muscle spasms, Impaired perceived functional ability, Impaired flexibility, Impaired tone, Impaired sensation, Improper body mechanics, Pain  Visit Diagnosis: Low back pain with right-sided sciatica, unspecified back pain laterality, unspecified chronicity  Muscle weakness (generalized)  Difficulty in walking, not elsewhere classified     Problem List Patient Active Problem List   Diagnosis Date Noted  . Osteoarthritis 02/02/2017  . Preventative health care 02/02/2017  . Hyperlipidemia 02/02/2017  . Essential hypertension 11/22/2015   Burnett Corrente, SPT This entire session was performed under direct supervision and direction of a licensed therapist/therapist assistant . I have personally read, edited and approve of the note as written.  Trotter,Margaret PT, DPT 09/05/2017, 10:16 AM  [place] MAIN [facility] SERVICES [address] [place], [place], [address] Phone: [phone]   Fax:  [phone]  Name: Kinzi Frediani MRN: [id number] Date of Birth: 24-Aug-1961

## 2017-09-05 ENCOUNTER — Telehealth: Payer: Self-pay | Admitting: Primary Care

## 2017-09-05 ENCOUNTER — Encounter (INDEPENDENT_AMBULATORY_CARE_PROVIDER_SITE_OTHER): Payer: 59 | Admitting: Physical Medicine and Rehabilitation

## 2017-09-05 NOTE — Telephone Encounter (Signed)
Patient brought in a Medical Certification for Application & Renewal of Disability Parking Placard form to be filled out by the Provider.  The form was placed in the Provider's prescription incoming box.

## 2017-09-06 ENCOUNTER — Ambulatory Visit: Payer: 59 | Admitting: Physical Therapy

## 2017-09-06 ENCOUNTER — Encounter: Payer: Self-pay | Admitting: Physical Therapy

## 2017-09-06 DIAGNOSIS — R262 Difficulty in walking, not elsewhere classified: Secondary | ICD-10-CM

## 2017-09-06 DIAGNOSIS — M5441 Lumbago with sciatica, right side: Secondary | ICD-10-CM | POA: Diagnosis not present

## 2017-09-06 DIAGNOSIS — M6281 Muscle weakness (generalized): Secondary | ICD-10-CM

## 2017-09-06 NOTE — Telephone Encounter (Signed)
Form has been completed as requested.  Spoken to patient and notified her that the form is ready for pick up.

## 2017-09-06 NOTE — Therapy (Signed)
[place] MAIN [facility] SERVICES [address] [place], [place], [address] Phone: [phone]   Fax:  [phone]  Physical Therapy Treatment  Patient Details  Name: Samantha Hall MRN: [id number] Date of Birth: Jun 20, 1961 Referring Provider: Alma Friendly NP  Encounter Date: 09/06/2017      PT End of Session - 09/06/17 0932    Visit Number 3   Number of Visits 17   Date for PT Re-Evaluation 10/25/17   PT Start Time 0846   PT Stop Time 0929   PT Time Calculation (min) 43 min   Equipment Utilized During Treatment --  TENS unit   Activity Tolerance Patient tolerated treatment well;Patient limited by pain;No increased pain   Behavior During Therapy WFL for tasks assessed/performed      Past Medical History:  Diagnosis Date  . Elevated antinuclear antibody (ANA) level    Negative workup for Lupus.  . Hyperlipidemia   . Hypertension    Controlled  . Osteoarthritis   . Sciatica   . Vitamin D deficiency     Past Surgical History:  Procedure Laterality Date  . ABDOMINAL HYSTERECTOMY  2002  . CHOLECYSTECTOMY  2015  . TUBAL LIGATION  1984    There were no vitals filed for this visit.      Subjective Assessment - 09/06/17 0847    Subjective (P)  PT reports that she has been having increased pain lately, though she is sleeping well and reports relief following her exercises. Pt reports 3/10 pain at this time    Pertinent History (P)  Pt had a fall on 7/26 without apparent injury; shortly after she began having severe LBP the same day. She reports that she tripped after stepping in a hole while walking in gravel outside a restaurant. She fell onto her hands and knees and did not notice and injury initially. She has been experience numbness and tingling in BUEs and in [place] since. She reports she has never had this type of pain before, though she has had sciatica in the past and was treated by her chiropractor with success. Pain is  alleviated with sitting and elevating feet. Pt reports she is currently able to tolerate standing activities longer without unbearable pain. She reports after several hours she has pain that and progresses to numbness in BLE, but greater in posterior RLE.   Limitations (P)  Standing;Walking;House hold activities;Lifting;Sitting   How long can you sit comfortably? (P)  Unlimited unless already in pain   How long can you stand comfortably? (P)  several hours   How long can you walk comfortably? (P)  4-5 hours, has to sit after onset of pain   Diagnostic tests (P)  MRI, [place] spine, 08/23/17: IMPRESSION: At L4-5 there is a minimal broad-based disc bulge. Mild    Currently in Pain? (P)  Yes   Pain Score (P)  3    Pain Location (P)  Back   Pain Orientation (P)  Lower;Right   Pain Radiating Towards (P)  Buttocks bilaterally, but more on the R   Pain Onset (P)  More than a month ago   Pain Frequency (P)  Intermittent                 Treatment:   Re-education for avoiding flexion based postures and to discontinue yoga temporarily until pain improves.     Ther-ex:   Prone     Prone on elbows sustained; pt initially reports discomfort in this position. Ice applied  x 10 min (un-billed). Pt encouraged to maintain position 3 x 30 sec              Repeated lumbar extension with press-up 3 x 10 with cues to perform in partial AROM until pain decreased slightly. Pt able to tolerate increased lumbar extension AROM with each set.    Press up 3 x 10 with manual overpressure of lower lumbar spine to facilitate better extension; cues to raise slightly higher for better lumbar extension. Pt reported slightly decreased pain.   Arm raises for paraspinal activation x 10 each; pt had decreased paraspinal activation on the R compared to the L with L paraspinal activation with both R and L UE raise. R paraspinal fire only mildly with contralateral UE raise.                Manual Therapy:   PT  performed manual therapy to lumbar extensors bilaterally and to soft tissue surrounding R PSIS/ superior glutes x19 min. Pt had increased tissue tightness in L lumbar extensors that decreased with manual therapy. Pt reported decreased pain and tightness following treatment.        PT Education - 09/06/17 0931    Education provided Yes   Education Details Ther-ex standing and prone extension   Person(s) Educated Patient   Methods Explanation;Demonstration;Tactile cues;Verbal cues   Comprehension Verbal cues required;Tactile cues required;Returned demonstration;Verbalized understanding          PT Short Term Goals - 08/30/17 1925      PT SHORT TERM GOAL #1   Title Pt will show improved lumbar AROM by 6 degrees in all directions in order to improve body mechanics and decrease pain with functional mobility.    Time 4   Period Weeks   Status New   Target Date 09/27/17     PT SHORT TERM GOAL #2   Title Pt will perform HEP at least 3/7 days/wk in order to show meaningful improvement in strength and AROM for improved functional mobility and decreased pain.   Time 4   Period Weeks   Status New   Target Date 09/27/17     PT SHORT TERM GOAL #3   Target Date --           PT Long Term Goals - 08/30/17 1930      PT LONG TERM GOAL #1   Title Patient will be independent in home exercise program to improve strength/mobility for better functional independence with ADLs.   Time 8   Period Weeks   Status New   Target Date 10/25/17     PT LONG TERM GOAL #2   Title Patient will increase BLE gross strength to 4+/5 as to improve functional strength for independent gait, increased standing tolerance and increased ADL ability.   Time 8   Period Weeks   Status New   Target Date 10/25/17     PT LONG TERM GOAL #3   Title Patient will report a worst pain of 3/10 on VAS in low back to improve tolerance with ADLs and reduced symptoms with activities.    Time 8   Period Weeks   Status New    Target Date 10/25/17     PT LONG TERM GOAL #4   Title  Patient will reduce Oswestry score to <20 as to demonstrate minimal disability with ADLs including improved sleeping tolerance, walking/sitting tolerance etc for better mobility with ADLs.    Baseline 52/100: Pt is 52% disabled based on her  perception of her low back pain.   Time 8   Period Weeks   Status New   Target Date 10/25/17     PT LONG TERM GOAL #5   Title Pt will show improved lumbar AROM by 12 degrees in all directions in order to improve body mechanics and decrease pain with functional mobility.    Time 8   Period Weeks   Status New   Target Date 10/25/17               Plan - 09/06/17 0933    Clinical Impression Statement Pt presents to therapy with 3/10 pain and slightly forward flexed posture upon entering exam room. Pt was instructed on prone extension exercise progression with manual overpressure as pt's pain decreased. Pt found to have decreased deep lumbar extensor activation on R side with arm elevation in prone, with increased muscle activation on L with elevation of either UE. PT performed soft tissue mobilization to lumbar extensors bilaterally and to superior glutes around PSIS. Pt had 0/10 pain following treatment. Pt will benefit from continued therapy to maximize functional mobility and decrease pain   Rehab Potential Good   Clinical Impairments Affecting Rehab Potential High perceived disability despite reports of pain improving   PT Frequency 2x / week   PT Duration 8 weeks   PT Treatment/Interventions ADLs/Self Care Home Management;Biofeedback;Moist Heat;Electrical Stimulation;Cryotherapy;Gait training;Therapeutic exercise;Therapeutic activities;Functional mobility training;Stair training;Neuromuscular re-education;Patient/family education;Manual techniques;Passive range of motion;Dry needling;Energy conservation;Traction   PT Next Visit Plan Further assess core strength, advance HEP with general LE  strengthening and extension based core strengthening exercise   PT Home Exercise Plan Repeated lumbar extension in prone and standing   Consulted and Agree with Plan of Care Patient      Patient will benefit from skilled therapeutic intervention in order to improve the following deficits and impairments:  Abnormal gait, Decreased activity tolerance, Decreased mobility, Decreased range of motion, Decreased strength, Difficulty walking, Hypomobility, Increased muscle spasms, Impaired perceived functional ability, Impaired flexibility, Impaired tone, Impaired sensation, Improper body mechanics, Pain  Visit Diagnosis: Low back pain with right-sided sciatica, unspecified back pain laterality, unspecified chronicity  Muscle weakness (generalized)  Difficulty in walking, not elsewhere classified     Problem List Patient Active Problem List   Diagnosis Date Noted  . Osteoarthritis 02/02/2017  . Preventative health care 02/02/2017  . Hyperlipidemia 02/02/2017  . Essential hypertension 11/22/2015   Burnett Corrente, SPT This entire session was performed under direct supervision and direction of a licensed therapist/therapist assistant . I have personally read, edited and approve of the note as written.  Trotter,Margaret PT, DPT 09/06/2017, 2:38 PM  [place] MAIN [facility] SERVICES [address] [place], [place], [address] Phone: [phone]   Fax:  [phone]  Name: Kalis Friese MRN: [id number] Date of Birth: 11-08-1961

## 2017-09-11 ENCOUNTER — Ambulatory Visit: Payer: 59 | Admitting: Physical Therapy

## 2017-09-11 ENCOUNTER — Encounter: Payer: Self-pay | Admitting: Physical Therapy

## 2017-09-11 DIAGNOSIS — M5441 Lumbago with sciatica, right side: Secondary | ICD-10-CM | POA: Diagnosis not present

## 2017-09-11 DIAGNOSIS — R262 Difficulty in walking, not elsewhere classified: Secondary | ICD-10-CM

## 2017-09-11 DIAGNOSIS — M6281 Muscle weakness (generalized): Secondary | ICD-10-CM

## 2017-09-11 NOTE — Therapy (Signed)
[place] MAIN [facility] SERVICES [address] [place], [place], [address] Phone: [phone]   Fax:  [phone]  Physical Therapy Treatment  Patient Details  Name: Samantha Hall MRN: [id number] Date of Birth: 1961/04/28 Referring Provider: Alma Friendly NP  Encounter Date: 09/11/2017      PT End of Session - 09/11/17 1522    Visit Number 4   Number of Visits 17   Date for PT Re-Evaluation 10/25/17   PT Start Time 3329   PT Stop Time 1600   PT Time Calculation (min) 45 min   Equipment Utilized During Treatment --  TENS unit   Activity Tolerance Patient tolerated treatment well;No increased pain   Behavior During Therapy WFL for tasks assessed/performed      Past Medical History:  Diagnosis Date  . Elevated antinuclear antibody (ANA) level    Negative workup for Lupus.  . Hyperlipidemia   . Hypertension    Controlled  . Osteoarthritis   . Sciatica   . Vitamin D deficiency     Past Surgical History:  Procedure Laterality Date  . ABDOMINAL HYSTERECTOMY  2002  . CHOLECYSTECTOMY  2015  . TUBAL LIGATION  1984    There were no vitals filed for this visit.      Subjective Assessment - 09/11/17 1520    Subjective Patient reports doing well today; Denies any pain currently; She reports having some pain earlier this morning but reports that it is better now. She reports still having intermittent LE pain that goes down the feet but the intensity and duration is less; She reports that she started back to work for 1/2 a day; she reports having some discomfort at the end of the day but reports that it went okay;    Pertinent History Pt had a fall on 7/26 without apparent injury; shortly after she began having severe LBP the same day. She reports that she tripped after stepping in a hole while walking in gravel outside a restaurant. She fell onto her hands and knees and did not notice and injury initially. She has been experience numbness  and tingling in BUEs and in [place] since. She reports she has never had this type of pain before, though she has had sciatica in the past and was treated by her chiropractor with success. Pain is alleviated with sitting and elevating feet. Pt reports she is currently able to tolerate standing activities longer without unbearable pain. She reports after several hours she has pain that and progresses to numbness in BLE, but greater in posterior RLE.   Limitations Standing;Walking;House hold activities;Lifting;Sitting   How long can you sit comfortably? Unlimited unless already in pain   How long can you stand comfortably? several hours   How long can you walk comfortably? 4-5 hours, has to sit after onset of pain   Diagnostic tests MRI, [place] spine, 08/23/17: IMPRESSION: At L4-5 there is a minimal broad-based disc bulge. Mild    Currently in Pain? No/denies   Pain Onset More than a month ago      TREATMENT:  Prone: Press up on elbows 3 x10 reps with cues to relax lumbar spine for better lumbar ROM. PT assessed multifidi muscle tone and patient continues to have decreased tone on right lumbar paraspinals with left UE lift;   Alternate hamstring curl x15 bilaterally with cues to avoid excessive knee flexion for less discomfort;  Alternate UE lift with cues to avoid excessive flexion into pain; She required lower head  of table to help improve shoulder range of motion with less back discomfort;  Attempted hip extension in prone but patient unable to lift leg due to weakness;  PT performed extensive manual therapy to patients lumbar paraspinals including soft and deep tissue massage using edge tool to help improve tissue extensibility and reduce pain x15 min. Patient exhibits increased tightness on right quadratus lumborum that was relieved with manual therapy. She was able to exhibit better multifidi activation on right side following manual therapy;   PT instructed patient in standing exercise for  better tolerance: Standing hip extension x10 bilaterally; Standing hip abduction x10 bilaterally; Standing hamstring curl x10 bilaterally; Patient required min-moderate verbal/tactile cues for correct exercise technique including cues to avoid trunk lean for better hip muscle activation. Patient reports less back pain with standing exercise and is able to demonstrate better ROM in gravity reduced position.   Standing paloff press green tband x10 reps each direction to facilitate better gluteal activation. Required cues for positioning to improve hip strengthening;   Patient denies any increase in pain at end of session;                      PT Education - 09/11/17 1522    Education provided Yes   Education Details prone extension; HEP reinforced;    Person(s) Educated Patient   Methods Explanation;Demonstration;Verbal cues   Comprehension Verbalized understanding;Returned demonstration;Verbal cues required;Need further instruction          PT Short Term Goals - 08/30/17 1925      PT SHORT TERM GOAL #1   Title Pt will show improved lumbar AROM by 6 degrees in all directions in order to improve body mechanics and decrease pain with functional mobility.    Time 4   Period Weeks   Status New   Target Date 09/27/17     PT SHORT TERM GOAL #2   Title Pt will perform HEP at least 3/7 days/wk in order to show meaningful improvement in strength and AROM for improved functional mobility and decreased pain.   Time 4   Period Weeks   Status New   Target Date 09/27/17     PT SHORT TERM GOAL #3   Target Date --           PT Long Term Goals - 08/30/17 1930      PT LONG TERM GOAL #1   Title Patient will be independent in home exercise program to improve strength/mobility for better functional independence with ADLs.   Time 8   Period Weeks   Status New   Target Date 10/25/17     PT LONG TERM GOAL #2   Title Patient will increase BLE gross strength to 4+/5 as to  improve functional strength for independent gait, increased standing tolerance and increased ADL ability.   Time 8   Period Weeks   Status New   Target Date 10/25/17     PT LONG TERM GOAL #3   Title Patient will report a worst pain of 3/10 on VAS in low back to improve tolerance with ADLs and reduced symptoms with activities.    Time 8   Period Weeks   Status New   Target Date 10/25/17     PT LONG TERM GOAL #4   Title  Patient will reduce Oswestry score to <20 as to demonstrate minimal disability with ADLs including improved sleeping tolerance, walking/sitting tolerance etc for better mobility with ADLs.    Baseline  52/100: Pt is 52% disabled based on her perception of her low back pain.   Time 8   Period Weeks   Status New   Target Date 10/25/17     PT LONG TERM GOAL #5   Title Pt will show improved lumbar AROM by 12 degrees in all directions in order to improve body mechanics and decrease pain with functional mobility.    Time 8   Period Weeks   Status New   Target Date 10/25/17               Plan - 09/11/17 1644    Clinical Impression Statement Patient instructed in advanced prone exercise. She reports less pain today. However PT identified decreased right lumbar paraspinals tone with UE lift as compared to right with decreased multifidi firing. PT performed extensive manual therapy to reduce tightness. Patient was able to demonstrate better muscle firing following soft tissue massage. Advanced HEP with extension based exercise. Patient would benefit from additional skilled PT intervention to reduce back pain and improve body mechanics.    Rehab Potential Good   Clinical Impairments Affecting Rehab Potential High perceived disability despite reports of pain improving   PT Frequency 2x / week   PT Duration 8 weeks   PT Treatment/Interventions ADLs/Self Care Home Management;Biofeedback;Moist Heat;Electrical Stimulation;Cryotherapy;Gait training;Therapeutic  exercise;Therapeutic activities;Functional mobility training;Stair training;Neuromuscular re-education;Patient/family education;Manual techniques;Passive range of motion;Dry needling;Energy conservation;Traction   PT Next Visit Plan Further assess core strength, advance HEP with general LE strengthening and extension based core strengthening exercise   PT Home Exercise Plan Repeated lumbar extension in prone and standing   Consulted and Agree with Plan of Care Patient      Patient will benefit from skilled therapeutic intervention in order to improve the following deficits and impairments:  Abnormal gait, Decreased activity tolerance, Decreased mobility, Decreased range of motion, Decreased strength, Difficulty walking, Hypomobility, Increased muscle spasms, Impaired perceived functional ability, Impaired flexibility, Impaired tone, Impaired sensation, Improper body mechanics, Pain  Visit Diagnosis: Low back pain with right-sided sciatica, unspecified back pain laterality, unspecified chronicity  Muscle weakness (generalized)  Difficulty in walking, not elsewhere classified     Problem List Patient Active Problem List   Diagnosis Date Noted  . Osteoarthritis 02/02/2017  . Preventative health care 02/02/2017  . Hyperlipidemia 02/02/2017  . Essential hypertension 11/22/2015    Sandrea Boer PT, DPT 09/11/2017, 4:56 PM  [place] MAIN [facility] SERVICES [address] [place], [place], [address] Phone: [phone]   Fax:  [phone]  Name: Lyndia Bury MRN: [id number] Date of Birth: May 04, 1961

## 2017-09-11 NOTE — Patient Instructions (Addendum)
EXTENSION: Standing (Active)    Stand, both feet flat. Draw right leg behind body as far as possible. Use __0_ lbs. Complete _2__ sets of __10_ repetitions. Perform __2_ sessions per day.  http://gtsc.exer.us/77   Copyright  VHI. All rights reserved.  Hamstring Curl    Hold onto counter for balance as needed. Shift weight onto right leg, while bending left knee. Repeat, switching legs. Repeat __10_ times, alternating legs. Do _2__ times per day.  Copyright  VHI. All rights reserved.  ABDUCTION: Standing (Active)    Stand, feet flat. Lift right leg out to side. Use _0__ lbs. Complete _2__ sets of _10__ repetitions. Perform __2_ sessions per day.  http://gtsc.exer.us/111   Copyright  VHI. All rights reserved.  Hamstrings    Lie on stomach with 0____ pound weight around left ankle. Alternate bending knee. DO NOT LIFT LEG INTO PAIN.  Hold ____ seconds. Repeat _10___ times. Do __2__ sessions per day. CAUTION: Move slowly.  Copyright  VHI. All rights reserved.  Bracing With Arm Lift (Prone)    With pillow support, lie on abdomen. Find neutral spine. Tighten pelvic floor and abdominals and hold . Alternately raise arms off bed. Repeat __10_ times. Do _2__ times a day.   Copyright  VHI. All rights reserved.

## 2017-09-13 ENCOUNTER — Telehealth: Payer: Self-pay

## 2017-09-13 ENCOUNTER — Encounter: Payer: Self-pay | Admitting: Primary Care

## 2017-09-13 NOTE — Telephone Encounter (Signed)
I called pt to get more info and pt is at [facility] now to be seen. FYI to Gentry Fitz NP.

## 2017-09-13 NOTE — Telephone Encounter (Signed)
Noted  

## 2017-09-17 ENCOUNTER — Encounter: Payer: Self-pay | Admitting: Physical Therapy

## 2017-09-17 ENCOUNTER — Ambulatory Visit: Payer: 59 | Attending: Primary Care | Admitting: Physical Therapy

## 2017-09-17 DIAGNOSIS — R262 Difficulty in walking, not elsewhere classified: Secondary | ICD-10-CM | POA: Insufficient documentation

## 2017-09-17 DIAGNOSIS — M5441 Lumbago with sciatica, right side: Secondary | ICD-10-CM | POA: Insufficient documentation

## 2017-09-17 DIAGNOSIS — M6281 Muscle weakness (generalized): Secondary | ICD-10-CM | POA: Diagnosis present

## 2017-09-17 NOTE — Therapy (Signed)
[place] MAIN [facility] SERVICES [address] [place], [place], [address] Phone: [phone]   Fax:  [phone]  Physical Therapy Treatment  Patient Details  Name: Samantha Hall MRN: [id number] Date of Birth: May 16, 1961 Referring Provider: Alma Friendly NP  Encounter Date: 09/17/2017      PT End of Session - 09/17/17 1535    Visit Number 5   Number of Visits 17   Date for PT Re-Evaluation 10/25/17   PT Start Time 3664   PT Stop Time 1600   PT Time Calculation (min) 30 min   Equipment Utilized During Treatment --  TENS unit   Activity Tolerance Patient tolerated treatment well;No increased pain   Behavior During Therapy WFL for tasks assessed/performed      Past Medical History:  Diagnosis Date  . Elevated antinuclear antibody (ANA) level    Negative workup for Lupus.  . Hyperlipidemia   . Hypertension    Controlled  . Osteoarthritis   . Sciatica   . Vitamin D deficiency     Past Surgical History:  Procedure Laterality Date  . ABDOMINAL HYSTERECTOMY  2002  . CHOLECYSTECTOMY  2015  . TUBAL LIGATION  1984    There were no vitals filed for this visit.      Subjective Assessment - 09/17/17 1534    Subjective Patient reports feeling well today; She reports no pain currently. she does report that she went to the doctor about the right sided thoracic pain and was diagnosed with a rib fracture that happened when she fell in August 2018. She reports that it is healing but that is why she was having so much discomfort; Reports compliance with HEP without difficulty;    Pertinent History Pt had a fall on 7/26 without apparent injury; shortly after she began having severe LBP the same day. She reports that she tripped after stepping in a hole while walking in gravel outside a restaurant. She fell onto her hands and knees and did not notice and injury initially. She has been experience numbness and tingling in BUEs and in [place] since.  She reports she has never had this type of pain before, though she has had sciatica in the past and was treated by her chiropractor with success. Pain is alleviated with sitting and elevating feet. Pt reports she is currently able to tolerate standing activities longer without unbearable pain. She reports after several hours she has pain that and progresses to numbness in BLE, but greater in posterior RLE.   Limitations Standing;Walking;House hold activities;Lifting;Sitting   How long can you sit comfortably? Unlimited unless already in pain   How long can you stand comfortably? several hours   How long can you walk comfortably? 4-5 hours, has to sit after onset of pain   Diagnostic tests MRI, [place] spine, 08/23/17: IMPRESSION: At L4-5 there is a minimal broad-based disc bulge. Mild    Currently in Pain? No/denies   Pain Onset More than a month ago        TREATMENT: Prone: Press up on elbows 3 x10 reps with cues to relax lumbar spine for better lumbar ROM. Press up on hands x10 reps with cues to relax Alternate hamstring curl x15 bilaterally with cues to avoid excessive knee flexion for less discomfort;  Alternate UE lift with cues to avoid excessive flexion into pain 2x10 bilaterally;  She required lower head of table to help improve shoulder range of motion with less back discomfort; requires cues to avoid trunk  rotation and to tighten core during UE lift; Patient able to demonstrate better right multifidi activation today as compared to previous sessions;  Gluteal max hip extension x10 bilaterally with min A for positioning to improve hip strengthening;   PT identified increased tightness to right tranverse process of L2-L5. PT performed grade II-III PA mobs to right transverse process L2-L5 10 sec bouts x2 reps each; She exhibited less tightness following initial bout. During manual therapy she did report discomfort along left posterior hip that resolved following manual therapy;    Standing, lumbar extension against cabinet x10 reps with cues to avoid painful ROM;  PT instructed patient in standing exercise for better tolerance: Standing hip extension x15 bilaterally, min Vcs to avoid forward lean for better hip extension strengthening;   Standing paloff press green tband x10 reps each direction to facilitate better gluteal activation. Required cues for positioning to improve hip strengthening; Patient denies any increase in pain at end of session;                            PT Education - 09/17/17 1535    Education provided Yes   Education Details prone extension; positioning; HEP reinforced;    Person(s) Educated Patient   Methods Explanation;Demonstration;Verbal cues   Comprehension Verbalized understanding;Returned demonstration;Verbal cues required;Need further instruction          PT Short Term Goals - 08/30/17 1925      PT SHORT TERM GOAL #1   Title Pt will show improved lumbar AROM by 6 degrees in all directions in order to improve body mechanics and decrease pain with functional mobility.    Time 4   Period Weeks   Status New   Target Date 09/27/17     PT SHORT TERM GOAL #2   Title Pt will perform HEP at least 3/7 days/wk in order to show meaningful improvement in strength and AROM for improved functional mobility and decreased pain.   Time 4   Period Weeks   Status New   Target Date 09/27/17     PT SHORT TERM GOAL #3   Target Date --           PT Long Term Goals - 08/30/17 1930      PT LONG TERM GOAL #1   Title Patient will be independent in home exercise program to improve strength/mobility for better functional independence with ADLs.   Time 8   Period Weeks   Status New   Target Date 10/25/17     PT LONG TERM GOAL #2   Title Patient will increase BLE gross strength to 4+/5 as to improve functional strength for independent gait, increased standing tolerance and increased ADL ability.   Time 8    Period Weeks   Status New   Target Date 10/25/17     PT LONG TERM GOAL #3   Title Patient will report a worst pain of 3/10 on VAS in low back to improve tolerance with ADLs and reduced symptoms with activities.    Time 8   Period Weeks   Status New   Target Date 10/25/17     PT LONG TERM GOAL #4   Title  Patient will reduce Oswestry score to <20 as to demonstrate minimal disability with ADLs including improved sleeping tolerance, walking/sitting tolerance etc for better mobility with ADLs.    Baseline 52/100: Pt is 52% disabled based on her perception of her low back pain.  Time 8   Period Weeks   Status New   Target Date 10/25/17     PT LONG TERM GOAL #5   Title Pt will show improved lumbar AROM by 12 degrees in all directions in order to improve body mechanics and decrease pain with functional mobility.    Time 8   Period Weeks   Status New   Target Date 10/25/17               Plan - 09/17/17 1545    Clinical Impression Statement Patient instructed in advanced prone exercise. She requires min Vcs for correct positioning and to relax lumbar paraspinals during press ups for better lumbar extension. She requires cues to increase core stabilization during UE/LE lift for better back stabilization to reduce back pain. Patient tolerated well without increase in pain. She does continues to have weakness in posterior hip which contributes to lumbar instability; Patient would benefit from additional skilled PT intervention to improve lumbar extension ROM and reduce pain with ADLs;    Rehab Potential Good   Clinical Impairments Affecting Rehab Potential High perceived disability despite reports of pain improving   PT Frequency 2x / week   PT Duration 8 weeks   PT Treatment/Interventions ADLs/Self Care Home Management;Biofeedback;Moist Heat;Electrical Stimulation;Cryotherapy;Gait training;Therapeutic exercise;Therapeutic activities;Functional mobility training;Stair  training;Neuromuscular re-education;Patient/family education;Manual techniques;Passive range of motion;Dry needling;Energy conservation;Traction   PT Next Visit Plan Further assess core strength, advance HEP with general LE strengthening and extension based core strengthening exercise   PT Home Exercise Plan Repeated lumbar extension in prone and standing   Consulted and Agree with Plan of Care Patient      Patient will benefit from skilled therapeutic intervention in order to improve the following deficits and impairments:  Abnormal gait, Decreased activity tolerance, Decreased mobility, Decreased range of motion, Decreased strength, Difficulty walking, Hypomobility, Increased muscle spasms, Impaired perceived functional ability, Impaired flexibility, Impaired tone, Impaired sensation, Improper body mechanics, Pain  Visit Diagnosis: Low back pain with right-sided sciatica, unspecified back pain laterality, unspecified chronicity  Muscle weakness (generalized)  Difficulty in walking, not elsewhere classified     Problem List Patient Active Problem List   Diagnosis Date Noted  . Osteoarthritis 02/02/2017  . Preventative health care 02/02/2017  . Hyperlipidemia 02/02/2017  . Essential hypertension 11/22/2015    Liala Codispoti PT, DPT 09/17/2017, 3:59 PM  [place] MAIN [facility] SERVICES [address] [place], [place], [address] Phone: [phone]   Fax:  [phone]  Name: Samantha Hall MRN: [id number] Date of Birth: 1961/07/18

## 2017-09-20 ENCOUNTER — Ambulatory Visit: Payer: 59 | Admitting: Physical Therapy

## 2017-09-20 ENCOUNTER — Encounter: Payer: Self-pay | Admitting: Physical Therapy

## 2017-09-20 DIAGNOSIS — R262 Difficulty in walking, not elsewhere classified: Secondary | ICD-10-CM

## 2017-09-20 DIAGNOSIS — M5441 Lumbago with sciatica, right side: Secondary | ICD-10-CM | POA: Diagnosis not present

## 2017-09-20 DIAGNOSIS — M6281 Muscle weakness (generalized): Secondary | ICD-10-CM

## 2017-09-20 NOTE — Therapy (Signed)
[place] MAIN [facility] SERVICES [address] [place], [place], [address] Phone: [phone]   Fax:  [phone]  Physical Therapy Treatment  Patient Details  Name: Samantha Hall MRN: [id number] Date of Birth: 30-Jun-1961 Referring Provider: Alma Friendly NP  Encounter Date: 09/20/2017      PT End of Session - 09/20/17 1548    Visit Number 6   Number of Visits 17   Date for PT Re-Evaluation 10/25/17   PT Start Time 1540   PT Stop Time 1615   PT Time Calculation (min) 35 min   Equipment Utilized During Treatment --  TENS unit   Activity Tolerance Patient tolerated treatment well;No increased pain   Behavior During Therapy WFL for tasks assessed/performed      Past Medical History:  Diagnosis Date  . Elevated antinuclear antibody (ANA) level    Negative workup for Lupus.  . Hyperlipidemia   . Hypertension    Controlled  . Osteoarthritis   . Sciatica   . Vitamin D deficiency     Past Surgical History:  Procedure Laterality Date  . ABDOMINAL HYSTERECTOMY  2002  . CHOLECYSTECTOMY  2015  . TUBAL LIGATION  1984    There were no vitals filed for this visit.      Subjective Assessment - 09/20/17 1546    Subjective Patient reports increased right side pain today; She reports that her back continues to get better and has not had any pain in her legs over last day or so.    Pertinent History Pt had a fall on 7/26 without apparent injury; shortly after she began having severe LBP the same day. She reports that she tripped after stepping in a hole while walking in gravel outside a restaurant. She fell onto her hands and knees and did not notice and injury initially. She has been experience numbness and tingling in BUEs and in [place] since. She reports she has never had this type of pain before, though she has had sciatica in the past and was treated by her chiropractor with success. Pain is alleviated with sitting and elevating feet. Pt  reports she is currently able to tolerate standing activities longer without unbearable pain. She reports after several hours she has pain that and progresses to numbness in BLE, but greater in posterior RLE.   Limitations Standing;Walking;House hold activities;Lifting;Sitting   How long can you sit comfortably? Unlimited unless already in pain   How long can you stand comfortably? several hours   How long can you walk comfortably? 4-5 hours, has to sit after onset of pain   Diagnostic tests MRI, [place] [facility], 08/23/17: IMPRESSION: At L4-5 there is a minimal broad-based disc bulge. Mild    Currently in Pain? Yes   Pain Score 5    Pain Location Rib cage   Pain Orientation Right   Pain Descriptors / Indicators Aching   Pain Type Acute pain   Pain Onset More than a month ago   Pain Frequency Intermittent   Aggravating Factors  worse with positional change/pressure;    Pain Relieving Factors rest/heat   Effect of Pain on Daily Activities decreased activity tolerance;              TREATMENT: Prone: Alternate hamstring curl x15 bilaterally with cues to avoid excessive knee flexion for less discomfort;  Gluteal max hip extension 2x12 bilaterally with min A for positioning to improve hip strengthening;  Hip extension SLR 2x10 bilaterally; Patient required min VCs to avoid  trunk rotation and improve core stabilization with LE movement for better tolerance and less back discomfort;   HOIST Hamstring curl, BLE plate #5, [id number] with cues to avoid lumbar extension and improve core stabilization with LE exercise for better tolerance;  Standing with green tband around BLE: Hip extension 2x10 bilaterally; Hip abduction 2x10 bilaterally; Patient required min-moderate verbal/tactile cues for correct exercise technique including cues to avoid trunk rotation for better hip strengthening;   Standing hamstring stretch with straight leg, 20 sec hold x2 bilaterally;Required min VCs for correct exercise  technique;  Standing paloff press green tband x15 reps each direction to facilitate better gluteal activation. Required cues for positioning to improve hip strengthening; Patient denies any increase in pain at end of session;                       PT Education - 09/20/17 1547    Education provided Yes   Education Details prone lumbar extension, hip strengthening, HEP reinforced;    Person(s) Educated Patient   Methods Explanation;Demonstration;Verbal cues   Comprehension Verbalized understanding;Returned demonstration;Verbal cues required;Need further instruction          PT Short Term Goals - 08/30/17 1925      PT SHORT TERM GOAL #1   Title Pt will show improved lumbar AROM by 6 degrees in all directions in order to improve body mechanics and decrease pain with functional mobility.    Time 4   Period Weeks   Status New   Target Date 09/27/17     PT SHORT TERM GOAL #2   Title Pt will perform HEP at least 3/7 days/wk in order to show meaningful improvement in strength and AROM for improved functional mobility and decreased pain.   Time 4   Period Weeks   Status New   Target Date 09/27/17     PT SHORT TERM GOAL #3   Target Date --           PT Long Term Goals - 08/30/17 1930      PT LONG TERM GOAL #1   Title Patient will be independent in home exercise program to improve strength/mobility for better functional independence with ADLs.   Time 8   Period Weeks   Status New   Target Date 10/25/17     PT LONG TERM GOAL #2   Title Patient will increase BLE gross strength to 4+/5 as to improve functional strength for independent gait, increased standing tolerance and increased ADL ability.   Time 8   Period Weeks   Status New   Target Date 10/25/17     PT LONG TERM GOAL #3   Title Patient will report a worst pain of 3/10 on VAS in low back to improve tolerance with ADLs and reduced symptoms with activities.    Time 8   Period Weeks   Status New    Target Date 10/25/17     PT LONG TERM GOAL #4   Title  Patient will reduce Oswestry score to <20 as to demonstrate minimal disability with ADLs including improved sleeping tolerance, walking/sitting tolerance etc for better mobility with ADLs.    Baseline 52/100: Pt is 52% disabled based on her perception of her low back pain.   Time 8   Period Weeks   Status New   Target Date 10/25/17     PT LONG TERM GOAL #5   Title Pt will show improved lumbar AROM by 12 degrees in all directions  in order to improve body mechanics and decrease pain with functional mobility.    Time 8   Period Weeks   Status New   Target Date 10/25/17               Plan - 09/20/17 1554    Clinical Impression Statement Patient late to session; Patient instructed in advanced LE strengthening. She reported increased right rib pain today with decreased tolerance with prone pressup/UE movement. Therefore held those exercise and focused on LE strengthening in tolerated position. patient does require min VCs to improve core stabilization and reduce trunk rotation with LE movement. She reports no increase in pain during exercise. she would benefit from additional skilled PT  intervention to improve LE strength and reduce back pain;    Rehab Potential Good   Clinical Impairments Affecting Rehab Potential High perceived disability despite reports of pain improving   PT Frequency 2x / week   PT Duration 8 weeks   PT Treatment/Interventions ADLs/Self Care Home Management;Biofeedback;Moist Heat;Electrical Stimulation;Cryotherapy;Gait training;Therapeutic exercise;Therapeutic activities;Functional mobility training;Stair training;Neuromuscular re-education;Patient/family education;Manual techniques;Passive range of motion;Dry needling;Energy conservation;Traction   PT Next Visit Plan Further assess core strength, advance HEP with general LE strengthening and extension based core strengthening exercise   PT Home Exercise Plan  Repeated lumbar extension in prone and standing   Consulted and Agree with Plan of Care Patient      Patient will benefit from skilled therapeutic intervention in order to improve the following deficits and impairments:  Abnormal gait, Decreased activity tolerance, Decreased mobility, Decreased range of motion, Decreased strength, Difficulty walking, Hypomobility, Increased muscle spasms, Impaired perceived functional ability, Impaired flexibility, Impaired tone, Impaired sensation, Improper body mechanics, Pain  Visit Diagnosis: Low back pain with right-sided sciatica, unspecified back pain laterality, unspecified chronicity  Muscle weakness (generalized)  Difficulty in walking, not elsewhere classified     Problem List Patient Active Problem List   Diagnosis Date Noted  . Osteoarthritis 02/02/2017  . Preventative health care 02/02/2017  . Hyperlipidemia 02/02/2017  . Essential hypertension 11/22/2015    Trey Gulbranson PT, DPT 09/20/2017, 4:11 PM  [place] MAIN [facility] SERVICES [address] [place], [place], [address] Phone: [phone]   Fax:  [phone]  Name: Samantha Hall MRN: [id number] Date of Birth: February 18, 1961

## 2017-09-25 ENCOUNTER — Ambulatory Visit: Payer: 59 | Admitting: Physical Therapy

## 2017-09-25 ENCOUNTER — Encounter: Payer: Self-pay | Admitting: Physical Therapy

## 2017-09-25 DIAGNOSIS — M6281 Muscle weakness (generalized): Secondary | ICD-10-CM

## 2017-09-25 DIAGNOSIS — M5441 Lumbago with sciatica, right side: Secondary | ICD-10-CM | POA: Diagnosis not present

## 2017-09-25 DIAGNOSIS — R262 Difficulty in walking, not elsewhere classified: Secondary | ICD-10-CM

## 2017-09-25 NOTE — Therapy (Signed)
[place] MAIN [facility] SERVICES [address] [place], [place], [address] Phone: [phone]   Fax:  [phone]  Physical Therapy Treatment  Patient Details  Name: Samantha Hall MRN: [id number] Date of Birth: [birth year] Referring Provider: Alma Friendly NP  Encounter Date: 09/25/2017      PT End of Session - 09/25/17 1537    Visit Number 7   Number of Visits 17   Date for PT Re-Evaluation 10/25/17   PT Start Time 1532   PT Stop Time 4034   PT Time Calculation (min) 43 min   Activity Tolerance Patient tolerated treatment well;No increased pain   Behavior During Therapy WFL for tasks assessed/performed      Past Medical History:  Diagnosis Date  . Elevated antinuclear antibody (ANA) level    Negative workup for Lupus.  . Hyperlipidemia   . Hypertension    Controlled  . Osteoarthritis   . Sciatica   . Vitamin D deficiency     Past Surgical History:  Procedure Laterality Date  . ABDOMINAL HYSTERECTOMY  2002  . CHOLECYSTECTOMY  2015  . TUBAL LIGATION  1984    There were no vitals filed for this visit.      Subjective Assessment - 09/25/17 1535    Subjective Patient reports doing well; She reports increased right side pain but states that her back has been better. She denies any radicular signs down her leg but does have some discomfort in her posterior hip with back muscle activation; She reports that work is going pretty good;    Pertinent History Pt had a fall on 7/26 without apparent injury; shortly after she began having severe LBP the same day. She reports that she tripped after stepping in a hole while walking in gravel outside a restaurant. She fell onto her hands and knees and did not notice and injury initially. She has been experience numbness and tingling in BUEs and in [place] since. She reports she has never had this type of pain before, though she has had sciatica in the past and was treated by her chiropractor with  success. Pain is alleviated with sitting and elevating feet. Pt reports she is currently able to tolerate standing activities longer without unbearable pain. She reports after several hours she has pain that and progresses to numbness in BLE, but greater in posterior RLE.   Limitations Standing;Walking;House hold activities;Lifting;Sitting   How long can you sit comfortably? Unlimited unless already in pain   How long can you stand comfortably? several hours   How long can you walk comfortably? 4-5 hours, has to sit after onset of pain   Diagnostic tests MRI, [place] [facility], 08/23/17: IMPRESSION: At L4-5 there is a minimal broad-based disc bulge. Mild    Currently in Pain? Yes   Pain Score 3    Pain Location Back   Pain Orientation Lower   Pain Descriptors / Indicators Aching;Sore   Pain Type Chronic pain   Pain Onset More than a month ago   Pain Frequency Intermittent   Aggravating Factors  worse with positional change/pressure; low back muscle activation;    Pain Relieving Factors rest/heat   Effect of Pain on Daily Activities decreased activity tolerance;    Multiple Pain Sites No           TREATMENT: Prone: Alternate hamstring curl x15 bilaterally with cues to avoid excessive knee flexion for less discomfort;  Gluteal max hip extension 2x15 bilaterally with min A for positioning to improve  hip strengthening;  Hip extension SLR 2x12 bilaterally; Alternate UE lift x10 bilaterally with cues to avoid trunk rotation for better lumbar multifidi activation; Alternate UE/LE lift combo 2x10 bilaterally with cues to increase core stabilization and avoid trunk rotation for better trunk stabilization;  Patient required min VCs to avoid trunk rotation and improve core stabilization with LE movement for better tolerance and less back discomfort;   HOIST Hamstring curl, BLE plate #5, [id number] with cues to avoid lumbar extension and improve core stabilization with LE exercise for better  tolerance;  Standing paloff press green tband x15 reps each direction to facilitate better gluteal activation. Required cues for positioning to improve hip strengthening; Seated thoracolumbar extension over small bolster 2x10 reps with cues to avoid painful ROM; Patient able to exhibit better lumbar extension with exercise without pain;  Patient denies any increase in pain at end of session;                           PT Education - 09/25/17 1536    Education provided Yes   Education Details prone lumbar extension, HEP reinforced;    Person(s) Educated Patient   Methods Explanation;Demonstration;Verbal cues   Comprehension Verbalized understanding;Returned demonstration;Verbal cues required;Need further instruction          PT Short Term Goals - 08/30/17 1925      PT SHORT TERM GOAL #1   Title Pt will show improved lumbar AROM by 6 degrees in all directions in order to improve body mechanics and decrease pain with functional mobility.    Time 4   Period Weeks   Status New   Target Date 09/27/17     PT SHORT TERM GOAL #2   Title Pt will perform HEP at least 3/7 days/wk in order to show meaningful improvement in strength and AROM for improved functional mobility and decreased pain.   Time 4   Period Weeks   Status New   Target Date 09/27/17     PT SHORT TERM GOAL #3   Target Date --           PT Long Term Goals - 08/30/17 1930      PT LONG TERM GOAL #1   Title Patient will be independent in home exercise program to improve strength/mobility for better functional independence with ADLs.   Time 8   Period Weeks   Status New   Target Date 10/25/17     PT LONG TERM GOAL #2   Title Patient will increase BLE gross strength to 4+/5 as to improve functional strength for independent gait, increased standing tolerance and increased ADL ability.   Time 8   Period Weeks   Status New   Target Date 10/25/17     PT LONG TERM GOAL #3   Title Patient  will report a worst pain of 3/10 on VAS in low back to improve tolerance with ADLs and reduced symptoms with activities.    Time 8   Period Weeks   Status New   Target Date 10/25/17     PT LONG TERM GOAL #4   Title  Patient will reduce Oswestry score to <20 as to demonstrate minimal disability with ADLs including improved sleeping tolerance, walking/sitting tolerance etc for better mobility with ADLs.    Baseline 52/100: Pt is 52% disabled based on her perception of her low back pain.   Time 8   Period Weeks   Status New   Target  Date 10/25/17     PT LONG TERM GOAL #5   Title Pt will show improved lumbar AROM by 12 degrees in all directions in order to improve body mechanics and decrease pain with functional mobility.    Time 8   Period Weeks   Status New   Target Date 10/25/17               Plan - 09/25/17 1551    Clinical Impression Statement Patient instructed in advanced LE strengthening and prone exercise. Advanced exercise with UE/LE movement to isolate lumbar multifidi activation for better back strengthening. Patient denies any pain but does report increased weakness with exercise. Patient does require min Vcs for correct exercise technique and to avoid trunk rotation for better  back stabilization. she would benefit from additional skilled PT intervention to improve lumbar strength and reduce back pain;    Rehab Potential Good   Clinical Impairments Affecting Rehab Potential High perceived disability despite reports of pain improving   PT Frequency 2x / week   PT Duration 8 weeks   PT Treatment/Interventions ADLs/Self Care Home Management;Biofeedback;Moist Heat;Electrical Stimulation;Cryotherapy;Gait training;Therapeutic exercise;Therapeutic activities;Functional mobility training;Stair training;Neuromuscular re-education;Patient/family education;Manual techniques;Passive range of motion;Dry needling;Energy conservation;Traction   PT Next Visit Plan Further assess core  strength, advance HEP with general LE strengthening and extension based core strengthening exercise   PT Home Exercise Plan Repeated lumbar extension in prone and standing   Consulted and Agree with Plan of Care Patient      Patient will benefit from skilled therapeutic intervention in order to improve the following deficits and impairments:  Abnormal gait, Decreased activity tolerance, Decreased mobility, Decreased range of motion, Decreased strength, Difficulty walking, Hypomobility, Increased muscle spasms, Impaired perceived functional ability, Impaired flexibility, Impaired tone, Impaired sensation, Improper body mechanics, Pain  Visit Diagnosis: Low back pain with right-sided sciatica, unspecified back pain laterality, unspecified chronicity  Muscle weakness (generalized)  Difficulty in walking, not elsewhere classified     Problem List Patient Active Problem List   Diagnosis Date Noted  . Osteoarthritis 02/02/2017  . Preventative health care 02/02/2017  . Hyperlipidemia 02/02/2017  . Essential hypertension 11/22/2015    Vivi Piccirilli PT, DPT 09/25/2017, 4:23 PM  [place] MAIN [facility] SERVICES [address] [place], [place], [address] Phone: [phone]   Fax:  [phone]  Name: Samantha Hall MRN: [id number] Date of Birth: 1961-07-04

## 2017-09-27 ENCOUNTER — Encounter: Payer: Self-pay | Admitting: Physical Therapy

## 2017-09-27 ENCOUNTER — Ambulatory Visit: Payer: 59 | Admitting: Physical Therapy

## 2017-09-27 ENCOUNTER — Encounter: Payer: Self-pay | Admitting: Primary Care

## 2017-09-27 DIAGNOSIS — M5441 Lumbago with sciatica, right side: Secondary | ICD-10-CM

## 2017-09-27 DIAGNOSIS — M6281 Muscle weakness (generalized): Secondary | ICD-10-CM

## 2017-09-27 DIAGNOSIS — R262 Difficulty in walking, not elsewhere classified: Secondary | ICD-10-CM

## 2017-09-27 NOTE — Therapy (Signed)
[place] MAIN [facility] SERVICES [address] [place], [place], [address] Phone: [phone]   Fax:  [phone]  Physical Therapy Treatment/Progress Note  Patient Details  Name: Samantha Hall MRN: [id number] Date of Birth: 04-Aug-1961 Referring Provider: Alma Friendly NP  Encounter Date: 09/27/2017      PT End of Session - 09/27/17 1533    Visit Number 8   Number of Visits 17   Date for PT Re-Evaluation 10/25/17   PT Start Time 2500   PT Stop Time 1610   PT Time Calculation (min) 43 min   Activity Tolerance Patient tolerated treatment well;No increased pain   Behavior During Therapy WFL for tasks assessed/performed      Past Medical History:  Diagnosis Date  . Elevated antinuclear antibody (ANA) level    Negative workup for Lupus.  . Hyperlipidemia   . Hypertension    Controlled  . Osteoarthritis   . Sciatica   . Vitamin D deficiency     Past Surgical History:  Procedure Laterality Date  . ABDOMINAL HYSTERECTOMY  2002  . CHOLECYSTECTOMY  2015  . TUBAL LIGATION  1984    There were no vitals filed for this visit.      Subjective Assessment - 09/27/17 1532    Subjective Patient reports increased soreness today; she is not sure if that is related to the bad weather or to mopping her floor yesterday. she reports that she didn't do a lot of bending over yesterday but it was sore. No pain down the legs;    Pertinent History Pt had a fall on 7/26 without apparent injury; shortly after she began having severe LBP the same day. She reports that she tripped after stepping in a hole while walking in gravel outside a restaurant. She fell onto her hands and knees and did not notice and injury initially. She has been experience numbness and tingling in BUEs and in [place] since. She reports she has never had this type of pain before, though she has had sciatica in the past and was treated by her chiropractor with success. Pain is alleviated  with sitting and elevating feet. Pt reports she is currently able to tolerate standing activities longer without unbearable pain. She reports after several hours she has pain that and progresses to numbness in BLE, but greater in posterior RLE.   Limitations Standing;Walking;House hold activities;Lifting;Sitting   How long can you sit comfortably? Unlimited unless already in pain   How long can you stand comfortably? several hours   How long can you walk comfortably? 4-5 hours, has to sit after onset of pain   Diagnostic tests MRI, [place] spine, 08/23/17: IMPRESSION: At L4-5 there is a minimal broad-based disc bulge. Mild    Currently in Pain? Yes   Pain Score 3    Pain Location Back   Pain Orientation Lower   Pain Descriptors / Indicators Aching;Sore   Pain Type Chronic pain   Pain Onset More than a month ago   Pain Frequency Intermittent   Aggravating Factors  worse after mopping floor; positional change   Pain Relieving Factors rest/heat/ice   Effect of Pain on Daily Activities decreased activity tolerance;    Multiple Pain Sites No            [facility] PT Assessment - 09/27/17 0001      Observation/Other Assessments   Modified Oswertry 42% impaired, improved from initial eval on 08/30/17 which was 52% impaired     AROM  Lumbar Flexion 35  improved from 20 degrees   Lumbar Extension 20  improved from 5 degrees   Lumbar - Right Side Bend 25  improved from 10 degrees   Lumbar - Left Side Bend 25  improved from 10 degrees     Strength   Overall Strength Comments Hip extension: 3-/5, hip flexion: 4+/5, hip abduction/adduction 4+/5, Knee and ankle 4+/5   All bilaterally;        TREATMENT: Prone: Press up with hands 2 x10 reps with cues to relax low back for better ROM tolerance; Alternate hamstring curl x15 bilaterally with cues to avoid excessive knee flexion for less discomfort;  Gluteal max hip extension 2x15 bilaterally with min A for positioning to improve hip  strengthening;  Hip extension SLR 2x15 bilaterally;  Patient required min VCs to avoid trunk rotation and improve core stabilization with LE movement for better tolerance and less back discomfort;    Instructed patient in outcome measures including: assess LE strength, Lumbar ROM and instructed patient to fill out modified oswestry; See above;   HOIST Hamstring curl, BLE plate #5,2x15 with cues to avoid lumbar extension and improve core stabilization with LE exercise for better tolerance;  Standing hip extension with green tband x15 bilaterally with cues to avoid forward trunk lean and to keep knee straight for better hamstring curl;  Standing hip abduction with green tband x15 bilaterally with cues for correct positioning to avoid painful range of motion;   Patient denies any increase in pain at end of session;                         PT Education - 09/27/17 1533    Education provided Yes   Education Details prone lumbar extension, progress towards goals, HEP reinforced;    Person(s) Educated Patient   Methods Explanation;Demonstration;Verbal cues   Comprehension Verbalized understanding;Returned demonstration;Verbal cues required;Need further instruction          PT Short Term Goals - 09/27/17 1549      PT SHORT TERM GOAL #1   Title Pt will show improved lumbar AROM by 6 degrees in all directions in order to improve body mechanics and decrease pain with functional mobility.    Time 4   Period Weeks   Status Achieved   Target Date 09/27/17     PT SHORT TERM GOAL #2   Title Pt will perform HEP at least 3/7 days/wk in order to show meaningful improvement in strength and AROM for improved functional mobility and decreased pain.   Time 4   Period Weeks   Status Achieved   Target Date 09/27/17           PT Long Term Goals - 09/27/17 1550      PT LONG TERM GOAL #1   Title Patient will be independent in home exercise program to improve  strength/mobility for better functional independence with ADLs.   Time 8   Period Weeks   Status Partially Met   Target Date 10/25/17     PT LONG TERM GOAL #2   Title Patient will increase BLE gross strength to 4+/5 as to improve functional strength for independent gait, increased standing tolerance and increased ADL ability.   Time 8   Period Weeks   Status Partially Met   Target Date 10/25/17     PT LONG TERM GOAL #3   Title Patient will report a worst pain of 3/10 on VAS in low back  to improve tolerance with ADLs and reduced symptoms with activities.    Time 8   Period Weeks   Status Partially Met   Target Date 10/25/17     PT LONG TERM GOAL #4   Title  Patient will reduce Oswestry score to <20 as to demonstrate minimal disability with ADLs including improved sleeping tolerance, walking/sitting tolerance etc for better mobility with ADLs.    Baseline 52/100: Pt is 52% disabled based on her perception of her low back pain.   Time 8   Period Weeks   Status Partially Met     PT LONG TERM GOAL #5   Title Pt will show improved lumbar AROM by 12 degrees in all directions in order to improve body mechanics and decrease pain with functional mobility.    Time 8   Period Weeks   Status Achieved   Target Date 10/25/17               Plan - 09/27/17 1605    Clinical Impression Statement Patient exhibits significant improvement in lumbar ROM. She also has centralization of symptoms only feeling low back pain without LE discomfort. Patient continues to have some weakness in LE particularly in posterior leg (hamstrings/gluteals.). Instructed patient in advanced strengthening exercise. She does require min Vcs for correct exercise technique. She would benefit from additional skilled PT intervention to improve LE strength and reduce back pain;    Rehab Potential Good   Clinical Impairments Affecting Rehab Potential High perceived disability despite reports of pain improving   PT  Frequency 2x / week   PT Duration 8 weeks   PT Treatment/Interventions ADLs/Self Care Home Management;Biofeedback;Moist Heat;Electrical Stimulation;Cryotherapy;Gait training;Therapeutic exercise;Therapeutic activities;Functional mobility training;Stair training;Neuromuscular re-education;Patient/family education;Manual techniques;Passive range of motion;Dry needling;Energy conservation;Traction   PT Next Visit Plan Further assess core strength, advance HEP with general LE strengthening and extension based core strengthening exercise   PT Home Exercise Plan Repeated lumbar extension in prone and standing   Consulted and Agree with Plan of Care Patient      Patient will benefit from skilled therapeutic intervention in order to improve the following deficits and impairments:  Abnormal gait, Decreased activity tolerance, Decreased mobility, Decreased range of motion, Decreased strength, Difficulty walking, Hypomobility, Increased muscle spasms, Impaired perceived functional ability, Impaired flexibility, Impaired tone, Impaired sensation, Improper body mechanics, Pain  Visit Diagnosis: Low back pain with right-sided sciatica, unspecified back pain laterality, unspecified chronicity  Muscle weakness (generalized)  Difficulty in walking, not elsewhere classified     Problem List Patient Active Problem List   Diagnosis Date Noted  . Osteoarthritis 02/02/2017  . Preventative health care 02/02/2017  . Hyperlipidemia 02/02/2017  . Essential hypertension 11/22/2015    Samantha Hall PT, DPT 09/27/2017, 4:09 PM  [place] MAIN [facility] SERVICES [address] [place], [place], [address] Phone: [phone]   Fax:  [phone]  Name: Samantha Hall MRN: [id number] Date of Birth: October 01, 1961

## 2017-09-28 ENCOUNTER — Encounter: Payer: Self-pay | Admitting: Primary Care

## 2017-10-02 ENCOUNTER — Encounter: Payer: Self-pay | Admitting: Physical Therapy

## 2017-10-02 ENCOUNTER — Ambulatory Visit: Payer: 59 | Admitting: Physical Therapy

## 2017-10-02 DIAGNOSIS — M6281 Muscle weakness (generalized): Secondary | ICD-10-CM

## 2017-10-02 DIAGNOSIS — R262 Difficulty in walking, not elsewhere classified: Secondary | ICD-10-CM

## 2017-10-02 DIAGNOSIS — M5441 Lumbago with sciatica, right side: Secondary | ICD-10-CM

## 2017-10-02 NOTE — Therapy (Signed)
[place] MAIN [facility] SERVICES [address] [place], [place], [address] Phone: [phone]   Fax:  [phone]  Physical Therapy Treatment  Patient Details  Name: Samantha Hall MRN: [id number] Date of Birth: 1961/07/28 Referring Provider: Alma Friendly NP  Encounter Date: 10/02/2017      PT End of Session - 10/02/17 1558    Visit Number 9   Number of Visits 17   Date for PT Re-Evaluation 10/25/17   PT Start Time 6962   PT Stop Time 9528   PT Time Calculation (min) 43 min   Activity Tolerance Patient tolerated treatment well;No increased pain   Behavior During Therapy WFL for tasks assessed/performed      Past Medical History:  Diagnosis Date  . Elevated antinuclear antibody (ANA) level    Negative workup for Lupus.  . Hyperlipidemia   . Hypertension    Controlled  . Osteoarthritis   . Sciatica   . Vitamin D deficiency     Past Surgical History:  Procedure Laterality Date  . ABDOMINAL HYSTERECTOMY  2002  . CHOLECYSTECTOMY  2015  . TUBAL LIGATION  1984    There were no vitals filed for this visit.      Subjective Assessment - 10/02/17 1558    Subjective Patient reports no pain currently; She does report having to take pain medicine earlier today as she was having low back/hip pain while at work;    Pertinent History Pt had a fall on 7/26 without apparent injury; shortly after she began having severe LBP the same day. She reports that she tripped after stepping in a hole while walking in gravel outside a restaurant. She fell onto her hands and knees and did not notice and injury initially. She has been experience numbness and tingling in BUEs and in [place] since. She reports she has never had this type of pain before, though she has had sciatica in the past and was treated by her chiropractor with success. Pain is alleviated with sitting and elevating feet. Pt reports she is currently able to tolerate standing activities  longer without unbearable pain. She reports after several hours she has pain that and progresses to numbness in BLE, but greater in posterior RLE.   Limitations Standing;Walking;House hold activities;Lifting;Sitting   How long can you sit comfortably? Unlimited unless already in pain   How long can you stand comfortably? several hours   How long can you walk comfortably? 4-5 hours, has to sit after onset of pain   Diagnostic tests MRI, [place] spine, 08/23/17: IMPRESSION: At L4-5 there is a minimal broad-based disc bulge. Mild    Currently in Pain? No/denies   Pain Onset More than a month ago         TREATMENT: Prone: Press up with hands x10 reps with cues to relax low back for better ROM tolerance; Alternate hamstring curl 2# x15 bilaterally with cues to avoid excessive knee flexion for less discomfort;  Gluteal max hip extension 2#, 2x10 bilaterally with min A for positioning to improve hip strengthening;  Hip extension SLR 2#,  2x10bilaterally;  Prone: Alternate UE lift 1#, 2 x10 bilaterally;  Patient required min VCs to avoid trunk rotation and improve core stabilization with LE movement for better tolerance and less back discomfort;   Progressed to Qped: Lumbar extension and then relax into neutral (cat but no camel hump) x5 reps with decreased ROM; Alternate UE lift x10 bilaterally; Alternate LE lift x5 bilaterally; Patient required min-moderate verbal/tactile cues  for correct exercise technique and to increase core abdominal stabilization with UE/LE movement  HOIST Hamstring curl, BLE plate #6, [id number] cues to avoid lumbar extension and improve core stabilization with LE exercise for better tolerance; HOIST low row, plate #2, [id number] with cues to increase scapular retraction for better upper back strengthening;  Standing pal-off press, BUE x15 each direction with cues for positioning and to increase core stabilization for less back/hip pain;  Standing lumbar extension against  bars x15 reps; Resisted walking backwards 12.5# to facilitate better hip extension strengthening x3 reps with CGA for safety and cues to increase step length for better hip strength;  Patient denies any increase in pain at end of session; She does report increased muscle fatigue with advanced exercise;                             PT Education - 10/02/17 1558    Education provided Yes   Education Details prone lumbar extension; LE strengthening; HEP reinforced;    Person(s) Educated Patient   Methods Explanation;Demonstration;Verbal cues   Comprehension Verbalized understanding;Returned demonstration;Verbal cues required;Need further instruction          PT Short Term Goals - 09/27/17 1549      PT SHORT TERM GOAL #1   Title Pt will show improved lumbar AROM by 6 degrees in all directions in order to improve body mechanics and decrease pain with functional mobility.    Time 4   Period Weeks   Status Achieved   Target Date 09/27/17     PT SHORT TERM GOAL #2   Title Pt will perform HEP at least 3/7 days/wk in order to show meaningful improvement in strength and AROM for improved functional mobility and decreased pain.   Time 4   Period Weeks   Status Achieved   Target Date 09/27/17           PT Long Term Goals - 09/27/17 1550      PT LONG TERM GOAL #1   Title Patient will be independent in home exercise program to improve strength/mobility for better functional independence with ADLs.   Time 8   Period Weeks   Status Partially Met   Target Date 10/25/17     PT LONG TERM GOAL #2   Title Patient will increase BLE gross strength to 4+/5 as to improve functional strength for independent gait, increased standing tolerance and increased ADL ability.   Time 8   Period Weeks   Status Partially Met   Target Date 10/25/17     PT LONG TERM GOAL #3   Title Patient will report a worst pain of 3/10 on VAS in low back to improve tolerance with ADLs and  reduced symptoms with activities.    Time 8   Period Weeks   Status Partially Met   Target Date 10/25/17     PT LONG TERM GOAL #4   Title  Patient will reduce Oswestry score to <20 as to demonstrate minimal disability with ADLs including improved sleeping tolerance, walking/sitting tolerance etc for better mobility with ADLs.    Baseline 52/100: Pt is 52% disabled based on her perception of her low back pain.   Time 8   Period Weeks   Status Partially Met     PT LONG TERM GOAL #5   Title Pt will show improved lumbar AROM by 12 degrees in all directions in order to improve body mechanics and decrease  pain with functional mobility.    Time 8   Period Weeks   Status Achieved   Target Date 10/25/17               Plan - 10/02/17 1632    Clinical Impression Statement patient instructed in advanced back/posterior hip strengthening. Advanced exercise with increased resistance. Patient did require increased time due to weakness and fatigue. She denies any significant increase in pain but does report muscle soreness from fatigue. patient would benefit from additional skilled PT intervention to improve strength and mobility;    Rehab Potential Good   Clinical Impairments Affecting Rehab Potential High perceived disability despite reports of pain improving   PT Frequency 2x / week   PT Duration 8 weeks   PT Treatment/Interventions ADLs/Self Care Home Management;Biofeedback;Moist Heat;Electrical Stimulation;Cryotherapy;Gait training;Therapeutic exercise;Therapeutic activities;Functional mobility training;Stair training;Neuromuscular re-education;Patient/family education;Manual techniques;Passive range of motion;Dry needling;Energy conservation;Traction   PT Next Visit Plan Further assess core strength, advance HEP with general LE strengthening and extension based core strengthening exercise   PT Home Exercise Plan Repeated lumbar extension in prone and standing   Consulted and Agree with  Plan of Care Patient      Patient will benefit from skilled therapeutic intervention in order to improve the following deficits and impairments:  Abnormal gait, Decreased activity tolerance, Decreased mobility, Decreased range of motion, Decreased strength, Difficulty walking, Hypomobility, Increased muscle spasms, Impaired perceived functional ability, Impaired flexibility, Impaired tone, Impaired sensation, Improper body mechanics, Pain  Visit Diagnosis: Low back pain with right-sided sciatica, unspecified back pain laterality, unspecified chronicity  Muscle weakness (generalized)  Difficulty in walking, not elsewhere classified     Problem List Patient Active Problem List   Diagnosis Date Noted  . Osteoarthritis 02/02/2017  . Preventative health care 02/02/2017  . Hyperlipidemia 02/02/2017  . Essential hypertension 11/22/2015    Daivik Overley PT, DPT 10/02/2017, 4:33 PM  [place] MAIN [facility] SERVICES [address] [place], [place], [address] Phone: [phone]   Fax:  [phone]  Name: Raynee Mccasland MRN: [id number] Date of Birth: 1961/01/26

## 2017-10-04 ENCOUNTER — Ambulatory Visit: Payer: 59 | Admitting: Physical Therapy

## 2017-10-09 ENCOUNTER — Encounter: Payer: Self-pay | Admitting: Physical Therapy

## 2017-10-09 ENCOUNTER — Ambulatory Visit: Payer: 59 | Admitting: Physical Therapy

## 2017-10-09 DIAGNOSIS — M5441 Lumbago with sciatica, right side: Secondary | ICD-10-CM

## 2017-10-09 DIAGNOSIS — M6281 Muscle weakness (generalized): Secondary | ICD-10-CM

## 2017-10-09 DIAGNOSIS — R262 Difficulty in walking, not elsewhere classified: Secondary | ICD-10-CM

## 2017-10-09 NOTE — Therapy (Signed)
[place] MAIN [facility] SERVICES [address] [place], [place], [address] Phone: [phone]   Fax:  [phone]  Physical Therapy Treatment  Patient Details  Name: Samantha Hall MRN: [id number] Date of Birth: [birth year] Referring Provider: Alma Friendly NP  Encounter Date: 10/09/2017      PT End of Session - 10/09/17 1550    Visit Number 10   Number of Visits 17   Date for PT Re-Evaluation 10/25/17   PT Start Time 3716   PT Stop Time 1620   PT Time Calculation (min) 40 min   Activity Tolerance Patient limited by pain   Behavior During Therapy [facility] for tasks assessed/performed      Past Medical History:  Diagnosis Date  . Elevated antinuclear antibody (ANA) level    Negative workup for Lupus.  . Hyperlipidemia   . Hypertension    Controlled  . Osteoarthritis   . Sciatica   . Vitamin D deficiency     Past Surgical History:  Procedure Laterality Date  . ABDOMINAL HYSTERECTOMY  2002  . CHOLECYSTECTOMY  2015  . TUBAL LIGATION  1984    There were no vitals filed for this visit.      Subjective Assessment - 10/09/17 1549    Subjective Patient reports increased soreness in BLE posterior hips; She denies any radicular pain. Reports working 6 hours yesterday, but only 4 hours today. patient reports compliance with HEP;    Pertinent History Pt had a fall on 7/26 without apparent injury; shortly after she began having severe LBP the same day. She reports that she tripped after stepping in a hole while walking in gravel outside a restaurant. She fell onto her hands and knees and did not notice and injury initially. She has been experience numbness and tingling in BUEs and in [place] since. She reports she has never had this type of pain before, though she has had sciatica in the past and was treated by her chiropractor with success. Pain is alleviated with sitting and elevating feet. Pt reports she is currently able to tolerate standing  activities longer without unbearable pain. She reports after several hours she has pain that and progresses to numbness in BLE, but greater in posterior RLE.   Limitations Standing;Walking;House hold activities;Lifting;Sitting   How long can you sit comfortably? Unlimited unless already in pain   How long can you stand comfortably? several hours   How long can you walk comfortably? 4-5 hours, has to sit after onset of pain   Diagnostic tests MRI, [place] spine, 08/23/17: IMPRESSION: At L4-5 there is a minimal broad-based disc bulge. Mild    Currently in Pain? Yes   Pain Score 4    Pain Location Hip   Pain Orientation Right;Left;Posterior   Pain Descriptors / Indicators Aching;Sharp   Pain Type Chronic pain   Pain Onset More than a month ago   Pain Frequency Intermittent   Aggravating Factors  worse after prolonged working/sitting/standing;    Pain Relieving Factors rest/heat/pain meds   Effect of Pain on Daily Activities decreased activity tolerance;    Multiple Pain Sites No            TREATMENT: PT applied moist heat to low back in prone x5 min (Unbilled) to loosen up hip/low back;   Prone: Press up on elbows x10 reps with cues to relax low back for better ROM tolerance; Press up on hands with over pressure along L5/S1 x10 reps with cues to increase ROM to  tolerance;  Alternate hamstring curl 2# x10 bilaterally with cues to avoid excessive knee flexion for less discomfort;  Gluteal max hip extension 2#, 2x12 bilaterally with min A for positioning to improve hip strengthening;  Hip extension SLR 2#,  2x12bilaterally;  Patient required min VCs to avoid trunk rotation and improve core stabilization with LE movement for better tolerance and less back discomfort;   HOIST Hamstring curl, BLE plate #6, 9S85 with cues to avoid lumbar extension and improve core stabilization with LE exercise for better tolerance; HOIST low row, plate #2, 4O27 with cues to increase scapular retraction  for better upper back strengthening;  Standing hamstring stretch 20 sec hold x2 bilaterally with cues to avoid lumbar flexion with better erect posture for better stretch;   Patient denies any increase in pain at end of session; She does report increased muscle fatigue with advanced exercise; Patient educated on importance of stretches to avoid excessive soreness in glutes with advanced exercise;                           PT Education - 10/09/17 1550    Education provided Yes   Education Details prone lumbar extension, LE strengthening, HEP reinforced;    Person(s) Educated Patient   Methods Explanation;Demonstration;Verbal cues   Comprehension Verbalized understanding;Returned demonstration;Verbal cues required;Need further instruction          PT Short Term Goals - 09/27/17 1549      PT SHORT TERM GOAL #1   Title Pt will show improved lumbar AROM by 6 degrees in all directions in order to improve body mechanics and decrease pain with functional mobility.    Time 4   Period Weeks   Status Achieved   Target Date 09/27/17     PT SHORT TERM GOAL #2   Title Pt will perform HEP at least 3/7 days/wk in order to show meaningful improvement in strength and AROM for improved functional mobility and decreased pain.   Time 4   Period Weeks   Status Achieved   Target Date 09/27/17           PT Long Term Goals - 09/27/17 1550      PT LONG TERM GOAL #1   Title Patient will be independent in home exercise program to improve strength/mobility for better functional independence with ADLs.   Time 8   Period Weeks   Status Partially Met   Target Date 10/25/17     PT LONG TERM GOAL #2   Title Patient will increase BLE gross strength to 4+/5 as to improve functional strength for independent gait, increased standing tolerance and increased ADL ability.   Time 8   Period Weeks   Status Partially Met   Target Date 10/25/17     PT LONG TERM GOAL #3   Title  Patient will report a worst pain of 3/10 on VAS in low back to improve tolerance with ADLs and reduced symptoms with activities.    Time 8   Period Weeks   Status Partially Met   Target Date 10/25/17     PT LONG TERM GOAL #4   Title  Patient will reduce Oswestry score to <20 as to demonstrate minimal disability with ADLs including improved sleeping tolerance, walking/sitting tolerance etc for better mobility with ADLs.    Baseline 52/100: Pt is 52% disabled based on her perception of her low back pain.   Time 8   Period Weeks  Status Partially Met     PT LONG TERM GOAL #5   Title Pt will show improved lumbar AROM by 12 degrees in all directions in order to improve body mechanics and decrease pain with functional mobility.    Time 8   Period Weeks   Status Achieved   Target Date 10/25/17               Plan - 10/09/17 1558    Clinical Impression Statement Patient reports increased pain in hips today; She reports most discomfort along pelvic junction/PSIS. PT attempted gentle lumbosacral distraction with increased pain; discontinued; PT instructed patient in lumbar extension exercise to facilitate better ROM/flexibility and LE posterior hip strength. Patient does require min VCs for correct positioning and for better exercise technique. She did require short rest breaks during LE exercise due to discomfort. Patient would benefit from additional skilled PT intervention to improve lumbar strength and reduce pain with ADLs;    Rehab Potential Good   Clinical Impairments Affecting Rehab Potential High perceived disability despite reports of pain improving   PT Frequency 2x / week   PT Duration 8 weeks   PT Treatment/Interventions ADLs/Self Care Home Management;Biofeedback;Moist Heat;Electrical Stimulation;Cryotherapy;Gait training;Therapeutic exercise;Therapeutic activities;Functional mobility training;Stair training;Neuromuscular re-education;Patient/family education;Manual  techniques;Passive range of motion;Dry needling;Energy conservation;Traction   PT Next Visit Plan Further assess core strength, advance HEP with general LE strengthening and extension based core strengthening exercise   PT Home Exercise Plan Repeated lumbar extension in prone and standing   Consulted and Agree with Plan of Care Patient      Patient will benefit from skilled therapeutic intervention in order to improve the following deficits and impairments:  Abnormal gait, Decreased activity tolerance, Decreased mobility, Decreased range of motion, Decreased strength, Difficulty walking, Hypomobility, Increased muscle spasms, Impaired perceived functional ability, Impaired flexibility, Impaired tone, Impaired sensation, Improper body mechanics, Pain  Visit Diagnosis: Low back pain with right-sided sciatica, unspecified back pain laterality, unspecified chronicity  Muscle weakness (generalized)  Difficulty in walking, not elsewhere classified     Problem List Patient Active Problem List   Diagnosis Date Noted  . Osteoarthritis 02/02/2017  . Preventative health care 02/02/2017  . Hyperlipidemia 02/02/2017  . Essential hypertension 11/22/2015    Nabor Thomann PT, DPT 10/09/2017, 4:17 PM  [place] MAIN [facility] SERVICES [address] [place], [place], [address] Phone: [phone]   Fax:  [phone]  Name: Samantha Hall MRN: [id number] Date of Birth: Jun 15, 1961

## 2017-10-11 ENCOUNTER — Ambulatory Visit: Payer: 59 | Admitting: Physical Therapy

## 2017-10-11 ENCOUNTER — Encounter: Payer: Self-pay | Admitting: Physical Therapy

## 2017-10-11 DIAGNOSIS — M5441 Lumbago with sciatica, right side: Secondary | ICD-10-CM

## 2017-10-11 DIAGNOSIS — R262 Difficulty in walking, not elsewhere classified: Secondary | ICD-10-CM

## 2017-10-11 DIAGNOSIS — M6281 Muscle weakness (generalized): Secondary | ICD-10-CM

## 2017-10-11 NOTE — Patient Instructions (Addendum)
Stretching: Hamstring (Standing)    Standing at step, put foot on bottom step with toe pointed up towards ceiling, standing straight, lean chest forward keeping back arched for better stretch in  Back of leg. Hold for 20 seconds;  Repeat __2-3__ times per set. Do 2____ sets per session. Do __2__ sessions per day.  http://orth.exer.us/659   Copyright  VHI. All rights reserved.  Piriformis Stretch, Sitting    Sit, one ankle on opposite knee, same-side hand on crossed knee. Push down on knee, keeping spine straight. Lean torso forward, with flat back, until tension is felt in hamstrings and gluteals of crossed-leg side. Hold __20_ seconds.  Repeat _2-3__ times per session. Do _2__ sessions per day.  Copyright  VHI. All rights reserved.

## 2017-10-11 NOTE — Therapy (Signed)
[place] MAIN [facility] SERVICES [address] [place], [place], [address] Phone: [phone]   Fax:  [phone]  Physical Therapy Treatment  Patient Details  Name: Samantha Hall MRN: [id number] Date of Birth: [birth year] Referring Provider: Alma Friendly NP  Encounter Date: 10/11/2017      PT End of Session - 10/11/17 1554    Visit Number 11   Number of Visits 17   Date for PT Re-Evaluation 10/25/17   PT Start Time 4656   PT Stop Time 8127   PT Time Calculation (min) 28 min   Activity Tolerance Patient tolerated treatment well;No increased pain   Behavior During Therapy WFL for tasks assessed/performed      Past Medical History:  Diagnosis Date  . Elevated antinuclear antibody (ANA) level    Negative workup for Lupus.  . Hyperlipidemia   . Hypertension    Controlled  . Osteoarthritis   . Sciatica   . Vitamin D deficiency     Past Surgical History:  Procedure Laterality Date  . ABDOMINAL HYSTERECTOMY  2002  . CHOLECYSTECTOMY  2015  . TUBAL LIGATION  1984    There were no vitals filed for this visit.      Subjective Assessment - 10/11/17 1552    Subjective Patient reports feeling better than last session; She reports advancing HEP with doing more repetitions. She reports less soreness today;    Pertinent History Pt had a fall on 7/26 without apparent injury; shortly after she began having severe LBP the same day. She reports that she tripped after stepping in a hole while walking in gravel outside a restaurant. She fell onto her hands and knees and did not notice and injury initially. She has been experience numbness and tingling in BUEs and in [place] since. She reports she has never had this type of pain before, though she has had sciatica in the past and was treated by her chiropractor with success. Pain is alleviated with sitting and elevating feet. Pt reports she is currently able to tolerate standing activities longer  without unbearable pain. She reports after several hours she has pain that and progresses to numbness in BLE, but greater in posterior RLE.   Limitations Standing;Walking;House hold activities;Lifting;Sitting   How long can you sit comfortably? Unlimited unless already in pain   How long can you stand comfortably? several hours   How long can you walk comfortably? 4-5 hours, has to sit after onset of pain   Diagnostic tests MRI, [place] spine, 08/23/17: IMPRESSION: At L4-5 there is a minimal broad-based disc bulge. Mild    Currently in Pain? Yes   Pain Score 2    Pain Location Back   Pain Orientation Lower   Pain Descriptors / Indicators Aching;Sore   Pain Type Chronic pain   Pain Onset More than a month ago   Pain Frequency Intermittent   Aggravating Factors  worse after prolonged walking/sitting/standing;    Pain Relieving Factors rest/heat/pain meds   Effect of Pain on Daily Activities decreased activity tolerance;    Multiple Pain Sites No           TREATMENT: Standing hamstring stretch, 20 sec hold x2 bilaterally with cues to avoid lumbar flexion with better erect posture for better stretch; Prone: Alternate hamstring curl 2# x15 bilaterally with cues to avoid excessive knee flexion for less discomfort;  Gluteal max hip extension 2#, x15bilaterally with min A for positioning to improve hip strengthening;  Hip extension SLR 2#, 2x12bilaterally;  BLE gluteal squeeze prone, 5 sec hold x10 reps with cues to avoid painful ROM for better strengthening tolerance; Patient had increased difficulty due to weakness, requiring cues to increase hold time for better strengthening;  Patient required min VCs to avoid trunk rotation and improve core stabilization with LE movement for better tolerance and less back discomfort;    Instructed patient in seated piriformis stretch modified and regular 15 sec hold x2 bilaterally; Required min Vcs for positioning for better stretch to avoid back  discomfort;  Advanced HEP with LE stretches to reduce hip soreness;  HOIST Hamstring curl, BLE plate #6, 1O87 with cues to avoid lumbar extension and improve core stabilization with LE exercise for better tolerance;      Patient denies any increase in pain at end of session; She does report increased muscle fatigue with advanced exercise; Patient educated on importance of stretches to avoid excessive soreness in glutes with advanced exercise;                           PT Education - 10/11/17 1554    Education provided Yes   Education Details Lumbar strengthening; posterior hip strengthening; HEP reinforced;    Person(s) Educated Patient   Methods Explanation;Demonstration;Verbal cues   Comprehension Verbalized understanding;Returned demonstration;Verbal cues required;Need further instruction          PT Short Term Goals - 09/27/17 1549      PT SHORT TERM GOAL #1   Title Pt will show improved lumbar AROM by 6 degrees in all directions in order to improve body mechanics and decrease pain with functional mobility.    Time 4   Period Weeks   Status Achieved   Target Date 09/27/17     PT SHORT TERM GOAL #2   Title Pt will perform HEP at least 3/7 days/wk in order to show meaningful improvement in strength and AROM for improved functional mobility and decreased pain.   Time 4   Period Weeks   Status Achieved   Target Date 09/27/17           PT Long Term Goals - 09/27/17 1550      PT LONG TERM GOAL #1   Title Patient will be independent in home exercise program to improve strength/mobility for better functional independence with ADLs.   Time 8   Period Weeks   Status Partially Met   Target Date 10/25/17     PT LONG TERM GOAL #2   Title Patient will increase BLE gross strength to 4+/5 as to improve functional strength for independent gait, increased standing tolerance and increased ADL ability.   Time 8   Period Weeks   Status Partially Met    Target Date 10/25/17     PT LONG TERM GOAL #3   Title Patient will report a worst pain of 3/10 on VAS in low back to improve tolerance with ADLs and reduced symptoms with activities.    Time 8   Period Weeks   Status Partially Met   Target Date 10/25/17     PT LONG TERM GOAL #4   Title  Patient will reduce Oswestry score to <20 as to demonstrate minimal disability with ADLs including improved sleeping tolerance, walking/sitting tolerance etc for better mobility with ADLs.    Baseline 52/100: Pt is 52% disabled based on her perception of her low back pain.   Time 8   Period Weeks   Status Partially Met  PT LONG TERM GOAL #5   Title Pt will show improved lumbar AROM by 12 degrees in all directions in order to improve body mechanics and decrease pain with functional mobility.    Time 8   Period Weeks   Status Achieved   Target Date 10/25/17               Plan - 10/11/17 1558    Clinical Impression Statement Patient late to session; Instructed patient in advanced LE strenthening focusing on gluteal strengthening and hip strengthening for better stabilization in low back. Patient requires min Vcs for correct exercise technique including to improve core stabilization and avoid trunk rotation for better hip strengthening. Patient also instructed in advanced LE hip stretches to reduce discomfort. She would benefit from additional skilled PT intervention to improve strength and reduce pain with ADLs;    Rehab Potential Good   Clinical Impairments Affecting Rehab Potential High perceived disability despite reports of pain improving   PT Frequency 2x / week   PT Duration 8 weeks   PT Treatment/Interventions ADLs/Self Care Home Management;Biofeedback;Moist Heat;Electrical Stimulation;Cryotherapy;Gait training;Therapeutic exercise;Therapeutic activities;Functional mobility training;Stair training;Neuromuscular re-education;Patient/family education;Manual techniques;Passive range of  motion;Dry needling;Energy conservation;Traction   PT Next Visit Plan Further assess core strength, advance HEP with general LE strengthening and extension based core strengthening exercise   PT Home Exercise Plan Repeated lumbar extension in prone and standing   Consulted and Agree with Plan of Care Patient      Patient will benefit from skilled therapeutic intervention in order to improve the following deficits and impairments:  Abnormal gait, Decreased activity tolerance, Decreased mobility, Decreased range of motion, Decreased strength, Difficulty walking, Hypomobility, Increased muscle spasms, Impaired perceived functional ability, Impaired flexibility, Impaired tone, Impaired sensation, Improper body mechanics, Pain  Visit Diagnosis: Low back pain with right-sided sciatica, unspecified back pain laterality, unspecified chronicity  Muscle weakness (generalized)  Difficulty in walking, not elsewhere classified     Problem List Patient Active Problem List   Diagnosis Date Noted  . Osteoarthritis 02/02/2017  . Preventative health care 02/02/2017  . Hyperlipidemia 02/02/2017  . Essential hypertension 11/22/2015    Osten Janek PT, DPT 10/11/2017, 5:14 PM  [place] MAIN [facility] SERVICES [address] [place], [place], [address] Phone: [phone]   Fax:  [phone]  Name: Samantha Hall MRN: [id number] Date of Birth: [birth year]

## 2017-10-16 ENCOUNTER — Ambulatory Visit: Payer: 59 | Admitting: Physical Therapy

## 2017-10-16 ENCOUNTER — Encounter: Payer: Self-pay | Admitting: Physical Therapy

## 2017-10-16 DIAGNOSIS — M5441 Lumbago with sciatica, right side: Secondary | ICD-10-CM

## 2017-10-16 DIAGNOSIS — M6281 Muscle weakness (generalized): Secondary | ICD-10-CM

## 2017-10-16 DIAGNOSIS — R262 Difficulty in walking, not elsewhere classified: Secondary | ICD-10-CM

## 2017-10-16 NOTE — Therapy (Signed)
[place] MAIN [facility] SERVICES [address] [place], [place], [address] Phone: [phone]   Fax:  [phone]  Physical Therapy Treatment  Patient Details  Name: Samantha Hall MRN: [id number] Date of Birth: [birth year] Referring Provider: Alma Friendly NP  Encounter Date: 10/16/2017      PT End of Session - 10/16/17 1539    Visit Number 12   Number of Visits 17   Date for PT Re-Evaluation 10/25/17   PT Start Time 1532   PT Stop Time 0814   PT Time Calculation (min) 43 min   Activity Tolerance Patient tolerated treatment well;No increased pain   Behavior During Therapy WFL for tasks assessed/performed      Past Medical History:  Diagnosis Date  . Elevated antinuclear antibody (ANA) level    Negative workup for Lupus.  . Hyperlipidemia   . Hypertension    Controlled  . Osteoarthritis   . Sciatica   . Vitamin D deficiency     Past Surgical History:  Procedure Laterality Date  . ABDOMINAL HYSTERECTOMY  2002  . CHOLECYSTECTOMY  2015  . TUBAL LIGATION  1984    There were no vitals filed for this visit.      Subjective Assessment - 10/16/17 1537    Subjective Patient reports feeling okay today. She is having a little stiffness today and some discomfort after working a full day.    Pertinent History Pt had a fall on 7/26 without apparent injury; shortly after she began having severe LBP the same day. She reports that she tripped after stepping in a hole while walking in gravel outside a restaurant. She fell onto her hands and knees and did not notice and injury initially. She has been experience numbness and tingling in BUEs and in [place] since. She reports she has never had this type of pain before, though she has had sciatica in the past and was treated by her chiropractor with success. Pain is alleviated with sitting and elevating feet. Pt reports she is currently able to tolerate standing activities longer without unbearable  pain. She reports after several hours she has pain that and progresses to numbness in BLE, but greater in posterior RLE.   Limitations Standing;Walking;House hold activities;Lifting;Sitting   How long can you sit comfortably? Unlimited unless already in pain   How long can you stand comfortably? several hours   How long can you walk comfortably? 4-5 hours, has to sit after onset of pain   Diagnostic tests MRI, [place] spine, 08/23/17: IMPRESSION: At L4-5 there is a minimal broad-based disc bulge. Mild    Currently in Pain? Yes   Pain Score 2    Pain Location Back   Pain Orientation Lower   Pain Descriptors / Indicators Aching;Sore   Pain Type Chronic pain   Pain Onset More than a month ago   Pain Frequency Intermittent   Aggravating Factors  worse after prolonged walking/sitting;    Pain Relieving Factors rest/heat/pain meds   Effect of Pain on Daily Activities decreased activity tolerance;    Multiple Pain Sites No         TREATMENT: Standing hamstring stretch, 20 sec hold x2 bilaterally with cues to avoid lumbar flexion with better erect posture for better stretch;  Prone: Press up with arms 2x10 reps with cues to relax gluteals and lumbar spine for better stretch;  Alternate hamstring curl 2# x15bilaterally with cues to avoid excessive knee flexion for less discomfort;  Gluteal max hip extension 2#,  x15bilaterally with min A for positioning to improve hip strengthening;  Hip extension SLR 2#, 2x15bilaterally;  BLE gluteal squeeze prone, 5 sec hold x10 reps with cues to avoid painful ROM for better strengthening tolerance; Patient had increased difficulty due to weakness, requiring cues to increase hold time for better strengthening;  Patient required min VCs to avoid trunk rotation and improve core stabilization with LE movement for better tolerance and less back discomfort;    Qped: Cat/camel in painfree range of motion x5 reps; Alternate UE/LE lift x5 each direction  with cues to increase core stabilization for better back support and less discomfort;   Instructed patient in seated piriformis stretch modified and regular 15 sec hold x2 bilaterally; Required min Vcs for positioning for better stretch to avoid back discomfort;    HOIST Hamstring curl, BLE plate #6, [id number] with cues to avoid lumbar extension and improve core stabilization with LE exercise for better tolerance; HOIST low row, plate #3, V78, plate #2, I69 with cues to increase scapular retraction for better postural strengthening; Patient reports increased difficulty with plate #3 due to weakness;   Tower machine: LE hip extension 2.5# resistance x15 bilaterally; with cues to avoid lumbar extension for better hip strengthening;   Step ups on 4 inch step with rail assist, with cues for gluteal squeeze x10 reps each LE;   Patient denies any increase in pain at end of session; She does report increased muscle fatigue with advanced exercise; Patient educated on importance of stretches to avoid excessive soreness in glutes with advanced exercise;                            PT Education - 10/16/17 1538    Education provided Yes   Education Details lumbar stretch/LE strengthening; HEP reinforced;    Person(s) Educated Patient   Methods Explanation;Demonstration;Verbal cues   Comprehension Verbalized understanding;Returned demonstration;Verbal cues required;Need further instruction          PT Short Term Goals - 09/27/17 1549      PT SHORT TERM GOAL #1   Title Pt will show improved lumbar AROM by 6 degrees in all directions in order to improve body mechanics and decrease pain with functional mobility.    Time 4   Period Weeks   Status Achieved   Target Date 09/27/17     PT SHORT TERM GOAL #2   Title Pt will perform HEP at least 3/7 days/wk in order to show meaningful improvement in strength and AROM for improved functional mobility and decreased pain.   Time 4    Period Weeks   Status Achieved   Target Date 09/27/17           PT Long Term Goals - 09/27/17 1550      PT LONG TERM GOAL #1   Title Patient will be independent in home exercise program to improve strength/mobility for better functional independence with ADLs.   Time 8   Period Weeks   Status Partially Met   Target Date 10/25/17     PT LONG TERM GOAL #2   Title Patient will increase BLE gross strength to 4+/5 as to improve functional strength for independent gait, increased standing tolerance and increased ADL ability.   Time 8   Period Weeks   Status Partially Met   Target Date 10/25/17     PT LONG TERM GOAL #3   Title Patient will report a worst pain of 3/10 on  VAS in low back to improve tolerance with ADLs and reduced symptoms with activities.    Time 8   Period Weeks   Status Partially Met   Target Date 10/25/17     PT LONG TERM GOAL #4   Title  Patient will reduce Oswestry score to <20 as to demonstrate minimal disability with ADLs including improved sleeping tolerance, walking/sitting tolerance etc for better mobility with ADLs.    Baseline 52/100: Pt is 52% disabled based on her perception of her low back pain.   Time 8   Period Weeks   Status Partially Met     PT LONG TERM GOAL #5   Title Pt will show improved lumbar AROM by 12 degrees in all directions in order to improve body mechanics and decrease pain with functional mobility.    Time 8   Period Weeks   Status Achieved   Target Date 10/25/17               Plan - 10/16/17 1550    Clinical Impression Statement Patient instructed in advanced lumbar extension ROM/strengthening exercise. She required min VCs for correct positioning and to increase core stabilization with advanced exercise. Patient reports less discomfort during exercise even with increased repetition. She would benefit from additional skilled PT intervention to improve strength and core stabilization while reducing back pain;    Rehab  Potential Good   Clinical Impairments Affecting Rehab Potential High perceived disability despite reports of pain improving   PT Frequency 2x / week   PT Duration 8 weeks   PT Treatment/Interventions ADLs/Self Care Home Management;Biofeedback;Moist Heat;Electrical Stimulation;Cryotherapy;Gait training;Therapeutic exercise;Therapeutic activities;Functional mobility training;Stair training;Neuromuscular re-education;Patient/family education;Manual techniques;Passive range of motion;Dry needling;Energy conservation;Traction   PT Next Visit Plan Further assess core strength, advance HEP with general LE strengthening and extension based core strengthening exercise   PT Home Exercise Plan Repeated lumbar extension in prone and standing   Consulted and Agree with Plan of Care Patient      Patient will benefit from skilled therapeutic intervention in order to improve the following deficits and impairments:  Abnormal gait, Decreased activity tolerance, Decreased mobility, Decreased range of motion, Decreased strength, Difficulty walking, Hypomobility, Increased muscle spasms, Impaired perceived functional ability, Impaired flexibility, Impaired tone, Impaired sensation, Improper body mechanics, Pain  Visit Diagnosis: Low back pain with right-sided sciatica, unspecified back pain laterality, unspecified chronicity  Muscle weakness (generalized)  Difficulty in walking, not elsewhere classified     Problem List Patient Active Problem List   Diagnosis Date Noted  . Osteoarthritis 02/02/2017  . Preventative health care 02/02/2017  . Hyperlipidemia 02/02/2017  . Essential hypertension 11/22/2015    Jerald Villalona PT, DPT 10/16/2017, 4:11 PM  [place] MAIN [facility] SERVICES [address] [place], [place], [address] Phone: [phone]   Fax:  [phone]  Name: Merrill Deanda MRN: [id number] Date of Birth: Jun 26, 1961

## 2017-10-17 ENCOUNTER — Telehealth: Payer: Self-pay | Admitting: Primary Care

## 2017-10-17 NOTE — Telephone Encounter (Signed)
Does she need the FMLA forms to be addended and refaxed or does she just need a note?

## 2017-10-17 NOTE — Telephone Encounter (Signed)
Copied from [place] [id number]. Topic: Inquiry >> Oct 16, 2017  4:28 PM Arletha Grippe wrote: Reason for CRM: pt needs an extension on fmla due her physical therapy being extended.  She is not able to do full duty.  She needs to have ppw completed before this Thursday. ( 11/1//18)  cb number is [phone] >> Oct 17, 2017 11:08 AM Ramond Craver B wrote: Spoke to pt .  She needs a letter from [facility] extending her light duty @ work until after 11/23/17. Her last day of PT is 11/21/17.  She is aware that [name] is out of the office until 10/29/17

## 2017-10-17 NOTE — Telephone Encounter (Signed)
Ok for work not to be extended until 12/7

## 2017-10-17 NOTE — Telephone Encounter (Signed)
She needs a letter extending her light duty @ work until 12/7.  Her last day of PT is 11/21/17

## 2017-10-18 ENCOUNTER — Encounter: Payer: Self-pay | Admitting: Physical Therapy

## 2017-10-18 ENCOUNTER — Ambulatory Visit: Payer: 59 | Attending: Primary Care | Admitting: Physical Therapy

## 2017-10-18 DIAGNOSIS — M5441 Lumbago with sciatica, right side: Secondary | ICD-10-CM | POA: Insufficient documentation

## 2017-10-18 DIAGNOSIS — M6281 Muscle weakness (generalized): Secondary | ICD-10-CM | POA: Insufficient documentation

## 2017-10-18 DIAGNOSIS — R262 Difficulty in walking, not elsewhere classified: Secondary | ICD-10-CM | POA: Insufficient documentation

## 2017-10-18 NOTE — Therapy (Signed)
[place] MAIN [facility] SERVICES [address] [place], [place], [address] Phone: [phone]   Fax:  [phone]  Physical Therapy Treatment  Patient Details  Name: Samantha Hall MRN: [id number] Date of Birth: May 16, 1961 Referring Provider: Alma Friendly NP  Encounter Date: 10/18/2017      PT End of Session - 10/18/17 1543    Visit Number 13   Number of Visits 17   Date for PT Re-Evaluation 10/25/17   PT Start Time 6378   PT Stop Time 5885   PT Time Calculation (min) 41 min   Activity Tolerance Patient tolerated treatment well;No increased pain   Behavior During Therapy WFL for tasks assessed/performed      Past Medical History:  Diagnosis Date  . Elevated antinuclear antibody (ANA) level    Negative workup for Lupus.  . Hyperlipidemia   . Hypertension    Controlled  . Osteoarthritis   . Sciatica   . Vitamin D deficiency     Past Surgical History:  Procedure Laterality Date  . ABDOMINAL HYSTERECTOMY  2002  . CHOLECYSTECTOMY  2015  . TUBAL LIGATION  1984    There were no vitals filed for this visit.      Subjective Assessment - 10/18/17 1541    Subjective Patient reports increased soreness today in low back with slight posterior hip pain; She reports that she took some pain meds with no relief; She reports having increased pain even with walking and with work tasks;    Pertinent History Pt had a fall on 7/26 without apparent injury; shortly after she began having severe LBP the same day. She reports that she tripped after stepping in a hole while walking in gravel outside a restaurant. She fell onto her hands and knees and did not notice and injury initially. She has been experience numbness and tingling in BUEs and in [place] since. She reports she has never had this type of pain before, though she has had sciatica in the past and was treated by her chiropractor with success. Pain is alleviated with sitting and elevating feet.  Pt reports she is currently able to tolerate standing activities longer without unbearable pain. She reports after several hours she has pain that and progresses to numbness in BLE, but greater in posterior RLE.   Limitations Standing;Walking;House hold activities;Lifting;Sitting   How long can you sit comfortably? Unlimited unless already in pain   How long can you stand comfortably? several hours   How long can you walk comfortably? 4-5 hours, has to sit after onset of pain   Diagnostic tests MRI, [place] [facility], 08/23/17: IMPRESSION: At L4-5 there is a minimal broad-based disc bulge. Mild    Currently in Pain? Yes   Pain Score 4    Pain Location Back   Pain Orientation Lower   Pain Descriptors / Indicators Aching;Sore   Pain Type Chronic pain   Pain Radiating Towards radiates to right hip;    Pain Onset More than a month ago   Pain Frequency Intermittent   Aggravating Factors  worse with prolonged walking; work tasks; worse after advanced exercise;    Pain Relieving Factors rest/heat/pain meds   Effect of Pain on Daily Activities decreased activity tolerance;    Multiple Pain Sites No         TREATMENT: Patient prone with moist heat to low back x10 min (Unbilled) to help reduce tightness in low back;  PT performed soft tissue massage to bilateral posterior thigh/hips along gluteals  and hamstrings x5 min to help reduce tightness   Prone: Alternate hamstring curl 2# x15bilaterally with cues to avoid excessive knee flexion for less discomfort;  Gluteal max hip extension 2#, x10bilaterally with min A for positioning to improve hip strengthening;  Hip extension SLR 2#, x15bilaterally;  BLE gluteal squeeze prone, 5 sec hold x10 reps with cues to avoid painful ROM for better strengthening tolerance; Patient had increased difficulty due to weakness, requiring cues to increase hold time for better strengthening;  Patient required min VCs to avoid trunk rotation and improve core  stabilization with LE movement for better tolerance and less back discomfort;   Instructed patient in seated piriformis stretch modified and regular 15 sec hold x2 bilaterally; Required min Vcs for positioning for better stretch to avoid back discomfort;    Standing hamstring stretch, 20 sec hold x2 bilaterally with cues to avoid lumbar flexion with better erect posture for better stretch;  Step ups on 4 inch step with rail assist, with cues for gluteal squeeze x10 reps each LE;   Patient denies any increase in pain at end of session; She does report increased muscle fatigue with advanced exercise; Patient educated on importance of stretches to avoid excessive soreness in glutes with advanced exercise;                           PT Education - 10/18/17 1543    Education provided Yes   Education Details lumbar stretch/LE strengthening, HEP reinforced;    Person(s) Educated Patient   Methods Explanation;Demonstration;Verbal cues   Comprehension Verbalized understanding;Returned demonstration;Verbal cues required;Need further instruction          PT Short Term Goals - 09/27/17 1549      PT SHORT TERM GOAL #1   Title Pt will show improved lumbar AROM by 6 degrees in all directions in order to improve body mechanics and decrease pain with functional mobility.    Time 4   Period Weeks   Status Achieved   Target Date 09/27/17     PT SHORT TERM GOAL #2   Title Pt will perform HEP at least 3/7 days/wk in order to show meaningful improvement in strength and AROM for improved functional mobility and decreased pain.   Time 4   Period Weeks   Status Achieved   Target Date 09/27/17           PT Long Term Goals - 09/27/17 1550      PT LONG TERM GOAL #1   Title Patient will be independent in home exercise program to improve strength/mobility for better functional independence with ADLs.   Time 8   Period Weeks   Status Partially Met   Target Date  10/25/17     PT LONG TERM GOAL #2   Title Patient will increase BLE gross strength to 4+/5 as to improve functional strength for independent gait, increased standing tolerance and increased ADL ability.   Time 8   Period Weeks   Status Partially Met   Target Date 10/25/17     PT LONG TERM GOAL #3   Title Patient will report a worst pain of 3/10 on VAS in low back to improve tolerance with ADLs and reduced symptoms with activities.    Time 8   Period Weeks   Status Partially Met   Target Date 10/25/17     PT LONG TERM GOAL #4   Title  Patient will reduce Oswestry score to <20  as to demonstrate minimal disability with ADLs including improved sleeping tolerance, walking/sitting tolerance etc for better mobility with ADLs.    Baseline 52/100: Pt is 52% disabled based on her perception of her low back pain.   Time 8   Period Weeks   Status Partially Met     PT LONG TERM GOAL #5   Title Pt will show improved lumbar AROM by 12 degrees in all directions in order to improve body mechanics and decrease pain with functional mobility.    Time 8   Period Weeks   Status Achieved   Target Date 10/25/17               Plan - 10/18/17 1559    Clinical Impression Statement Patient reports increased discomfort today; PT applied moist heat and performed soft tissue massage to reduce tightness. In addition, reduced repetitions of LE exercise for better tolerance. Patient does require min VCs to increase core stabilization during LE movement for better tolerance and improved back mechanics; patient was able to exhibit better multifidi activation on both sides as compared to a month ago; Patient would benefit from additional skilled PT intervention to improve strength, postural control and reduce pain;    Rehab Potential Good   Clinical Impairments Affecting Rehab Potential High perceived disability despite reports of pain improving   PT Frequency 2x / week   PT Duration 8 weeks   PT  Treatment/Interventions ADLs/Self Care Home Management;Biofeedback;Moist Heat;Electrical Stimulation;Cryotherapy;Gait training;Therapeutic exercise;Therapeutic activities;Functional mobility training;Stair training;Neuromuscular re-education;Patient/family education;Manual techniques;Passive range of motion;Dry needling;Energy conservation;Traction   PT Next Visit Plan Further assess core strength, advance HEP with general LE strengthening and extension based core strengthening exercise   PT Home Exercise Plan Repeated lumbar extension in prone and standing   Consulted and Agree with Plan of Care Patient      Patient will benefit from skilled therapeutic intervention in order to improve the following deficits and impairments:  Abnormal gait, Decreased activity tolerance, Decreased mobility, Decreased range of motion, Decreased strength, Difficulty walking, Hypomobility, Increased muscle spasms, Impaired perceived functional ability, Impaired flexibility, Impaired tone, Impaired sensation, Improper body mechanics, Pain  Visit Diagnosis: Low back pain with right-sided sciatica, unspecified back pain laterality, unspecified chronicity  Muscle weakness (generalized)  Difficulty in walking, not elsewhere classified     Problem List Patient Active Problem List   Diagnosis Date Noted  . Osteoarthritis 02/02/2017  . Preventative health care 02/02/2017  . Hyperlipidemia 02/02/2017  . Essential hypertension 11/22/2015    Eoghan Belcher PT, DPT 10/18/2017, 4:12 PM  [place] MAIN [facility] SERVICES [address] [place], [place], [address] Phone: [phone]   Fax:  [phone]  Name: Samantha Hall MRN: [id number] Date of Birth: [birth year]

## 2017-10-19 NOTE — Telephone Encounter (Signed)
Spoke with pt she has appointment with kate on 11/14 she will discuss this at the visit.  I let her know regina ok to extend till 12/7.  Pt stated if letter has been done let her know and she will come pick it up.  Best number [phone]

## 2017-10-22 ENCOUNTER — Telehealth: Payer: Self-pay | Admitting: Primary Care

## 2017-10-22 NOTE — Telephone Encounter (Signed)
Copied from [place] [id number]. Topic: Quick Communication - See Telephone Encounter >> Oct 22, 2017  9:25 AM Robina Ade, Helene Kelp D wrote: CRM for notification. See Telephone encounter for: 10/22/17. Patient called wanting to speak with Alma Friendly CMA regarding if a letter is ready for her to pick it up? Please call patient back, thanks.

## 2017-10-22 NOTE — Telephone Encounter (Signed)
Please see 10/17/17 phone note.

## 2017-10-22 NOTE — Telephone Encounter (Signed)
Letter printed.

## 2017-10-23 ENCOUNTER — Ambulatory Visit: Payer: 59 | Admitting: Physical Therapy

## 2017-10-23 ENCOUNTER — Encounter: Payer: Self-pay | Admitting: Physical Therapy

## 2017-10-23 DIAGNOSIS — R262 Difficulty in walking, not elsewhere classified: Secondary | ICD-10-CM

## 2017-10-23 DIAGNOSIS — M5441 Lumbago with sciatica, right side: Secondary | ICD-10-CM

## 2017-10-23 DIAGNOSIS — M6281 Muscle weakness (generalized): Secondary | ICD-10-CM

## 2017-10-23 NOTE — Telephone Encounter (Signed)
Letter was given to Samantha Hall to place up front for patient to pick up.

## 2017-10-23 NOTE — Therapy (Signed)
[place] MAIN [facility] SERVICES [address] [place], [place], [address] Phone: [phone]   Fax:  [phone]  Physical Therapy Treatment  Patient Details  Name: Samantha Hall MRN: [id number] Date of Birth: [birth year] Referring Provider: Alma Friendly NP   Encounter Date: 10/23/2017  PT End of Session - 10/23/17 1547    Visit Number  14    Number of Visits  17    Date for PT Re-Evaluation  10/25/17    PT Start Time  9381    PT Stop Time  8299    PT Time Calculation (min)  40 min    Activity Tolerance  Patient tolerated treatment well;No increased pain    Behavior During Therapy  WFL for tasks assessed/performed       Past Medical History:  Diagnosis Date  . Elevated antinuclear antibody (ANA) level    Negative workup for Lupus.  . Hyperlipidemia   . Hypertension    Controlled  . Osteoarthritis   . Sciatica   . Vitamin D deficiency     Past Surgical History:  Procedure Laterality Date  . ABDOMINAL HYSTERECTOMY  2002  . CHOLECYSTECTOMY  2015  . TUBAL LIGATION  1984    There were no vitals filed for this visit.  Subjective Assessment - 10/23/17 1540    Subjective  Patient reports increased soreness over the weekend and states, "I took it easy"; She reports when her back was hurting and she was having tingling in her feet; She reports now her back is better but she still has some discomfort; She reports that the left side is more uncomfortable today;     Pertinent History  Pt had a fall on 7/26 without apparent injury; shortly after she began having severe LBP the same day. She reports that she tripped after stepping in a hole while walking in gravel outside a restaurant. She fell onto her hands and knees and did not notice and injury initially. She has been experience numbness and tingling in BUEs and in [place] since. She reports she has never had this type of pain before, though she has had sciatica in the past and was treated  by her chiropractor with success. Pain is alleviated with sitting and elevating feet. Pt reports she is currently able to tolerate standing activities longer without unbearable pain. She reports after several hours she has pain that and progresses to numbness in BLE, but greater in posterior RLE.    Limitations  Standing;Walking;House hold activities;Lifting;Sitting    How long can you sit comfortably?  Unlimited unless already in pain    How long can you stand comfortably?  several hours    How long can you walk comfortably?  4-5 hours, has to sit after onset of pain    Diagnostic tests  MRI, [facility], 08/23/17: IMPRESSION: At L4-5 there is a minimal broad-based disc bulge. Mild     Currently in Pain?  Yes    Pain Score  3     Pain Location  Back    Pain Orientation  Left;Lower    Pain Descriptors / Indicators  Aching;Sore    Pain Type  Chronic pain    Pain Onset  More than a month ago    Pain Frequency  Intermittent    Aggravating Factors   worse with prolonged walking/work tasks;     Pain Relieving Factors  rest/heat/pain meds    Effect of Pain on Daily Activities  decreased activity tolerance;  Multiple Pain Sites  No            TREATMENT: PT performed soft tissue massage to bilateral posterior thigh/hips along gluteals and hamstrings x5 min utilizing rolling stick to help reduce tightness   Prone: Press ups on hands with cues to improve lumbar extension x10 reps;  Alternate hamstring curl 2# x15bilaterally with cues to avoid excessive knee flexion for less discomfort;  Gluteal max hip extension 2#, x12bilaterally with min A for positioning to improve hip strengthening;  Hip extension SLR 2#, x15bilaterally;  BLE gluteal squeeze prone, 5 sec hold x10 reps with cues to avoid painful ROM for better strengthening tolerance; Patient had increased difficulty due to weakness, requiring cues to increase hold time for better strengthening;  Patient required min VCs to avoid  trunk rotation and improve core stabilization with LE movement for better tolerance and less back discomfort;   Patient in sidelying: Hip abduction clamshells green tband 2x12 with cues to avoid posterior rotation for better hip abductor strengthening;   Patient hooklying: Single knee to chest 15 sec hold x1 bilaterally; However held exercise as that seemed to increase discomfort; Continue with prone exercise despite slight back discomfort;   Standing hamstring stretch, 20 sec hold x2 bilaterally with cues to avoid lumbar flexion with better erect posture for better stretch; Standing lumbar extension against washer 2x10 reps with cues to avoid painful ROM;   Patient denies any increase in pain at end of session; She does report increased muscle fatigue with advanced exercise; Patient educated on importance of stretches to avoid excessive soreness in glutes with advanced exercise;                        PT Education - 10/23/17 1547    Education provided  Yes    Education Details  Lumbar stretch/LE strengthening; Soft tissue; HEP reinforced;     Person(s) Educated  Patient    Methods  Explanation;Demonstration;Verbal cues    Comprehension  Verbalized understanding;Returned demonstration;Verbal cues required;Need further instruction       PT Short Term Goals - 09/27/17 1549      PT SHORT TERM GOAL #1   Title  Pt will show improved lumbar AROM by 6 degrees in all directions in order to improve body mechanics and decrease pain with functional mobility.     Time  4    Period  Weeks    Status  Achieved    Target Date  09/27/17      PT SHORT TERM GOAL #2   Title  Pt will perform HEP at least 3/7 days/wk in order to show meaningful improvement in strength and AROM for improved functional mobility and decreased pain.    Time  4    Period  Weeks    Status  Achieved    Target Date  09/27/17        PT Long Term Goals - 09/27/17 1550      PT LONG TERM GOAL #1    Title  Patient will be independent in home exercise program to improve strength/mobility for better functional independence with ADLs.    Time  8    Period  Weeks    Status  Partially Met    Target Date  10/25/17      PT LONG TERM GOAL #2   Title  Patient will increase BLE gross strength to 4+/5 as to improve functional strength for independent gait, increased standing tolerance and increased ADL  ability.    Time  8    Period  Weeks    Status  Partially Met    Target Date  10/25/17      PT LONG TERM GOAL #3   Title  Patient will report a worst pain of 3/10 on VAS in low back to improve tolerance with ADLs and reduced symptoms with activities.     Time  8    Period  Weeks    Status  Partially Met    Target Date  10/25/17      PT LONG TERM GOAL #4   Title   Patient will reduce Oswestry score to <20 as to demonstrate minimal disability with ADLs including improved sleeping tolerance, walking/sitting tolerance etc for better mobility with ADLs.     Baseline  52/100: Pt is 52% disabled based on her perception of her low back pain.    Time  8    Period  Weeks    Status  Partially Met      PT LONG TERM GOAL #5   Title  Pt will show improved lumbar AROM by 12 degrees in all directions in order to improve body mechanics and decrease pain with functional mobility.     Time  8    Period  Weeks    Status  Achieved    Target Date  10/25/17            Plan - 10/23/17 1558    Clinical Impression Statement  Patient has been having increased intermittent back/hip discomfort; educated patient in advanced LE posterior hip/lumbar strengthening in a way to reduce discomfort; Patient does require cues for correct positioning to avoid compensation for better strengthening; Patient would benefit from additional skilled PT intervention to improve strength and core stabilization to reduce pain with ADLs;     Rehab Potential  Good    Clinical Impairments Affecting Rehab Potential  High perceived  disability despite reports of pain improving    PT Frequency  2x / week    PT Duration  8 weeks    PT Treatment/Interventions  ADLs/Self Care Home Management;Biofeedback;Moist Heat;Electrical Stimulation;Cryotherapy;Gait training;Therapeutic exercise;Therapeutic activities;Functional mobility training;Stair training;Neuromuscular re-education;Patient/family education;Manual techniques;Passive range of motion;Dry needling;Energy conservation;Traction    PT Next Visit Plan  Further assess core strength, advance HEP with general LE strengthening and extension based core strengthening exercise    PT Home Exercise Plan  Repeated lumbar extension in prone and standing    Consulted and Agree with Plan of Care  Patient       Patient will benefit from skilled therapeutic intervention in order to improve the following deficits and impairments:  Abnormal gait, Decreased activity tolerance, Decreased mobility, Decreased range of motion, Decreased strength, Difficulty walking, Hypomobility, Increased muscle spasms, Impaired perceived functional ability, Impaired flexibility, Impaired tone, Impaired sensation, Improper body mechanics, Pain  Visit Diagnosis: Low back pain with right-sided sciatica, unspecified back pain laterality, unspecified chronicity  Muscle weakness (generalized)  Difficulty in walking, not elsewhere classified     Problem List Patient Active Problem List   Diagnosis Date Noted  . Osteoarthritis 02/02/2017  . Preventative health care 02/02/2017  . Hyperlipidemia 02/02/2017  . Essential hypertension 11/22/2015    Kienan Doublin PT, DPT 10/23/2017, 4:09 PM  [place] [facility]IN [facility] SERVICES [address] [place], [place], [address] Phone: [phone]   Fax:  [phone]  Name: Samantha Hall MRN: [id number] Date of Birth: August 17, 1961

## 2017-10-25 ENCOUNTER — Ambulatory Visit: Payer: 59 | Admitting: Physical Therapy

## 2017-10-25 ENCOUNTER — Encounter: Payer: Self-pay | Admitting: Physical Therapy

## 2017-10-25 DIAGNOSIS — R262 Difficulty in walking, not elsewhere classified: Secondary | ICD-10-CM

## 2017-10-25 DIAGNOSIS — M5441 Lumbago with sciatica, right side: Secondary | ICD-10-CM | POA: Diagnosis not present

## 2017-10-25 DIAGNOSIS — M6281 Muscle weakness (generalized): Secondary | ICD-10-CM

## 2017-10-25 NOTE — Therapy (Signed)
[place] MAIN [facility] SERVICES [address] [place], [place], [address] Phone: [phone]   Fax:  [phone]  Physical Therapy Treatment/Progress Note  Patient Details  Name: Samantha Hall MRN: [id number] Date of Birth: 1961-03-29 Referring Provider: Alma Friendly NP   Encounter Date: 10/25/2017  PT End of Session - 10/25/17 1549    Visit Number  15    Number of Visits  25    Date for PT Re-Evaluation  11/22/17    PT Start Time  7619    PT Stop Time  5093    PT Time Calculation (min)  40 min    Activity Tolerance  Patient tolerated treatment well;No increased pain    Behavior During Therapy  WFL for tasks assessed/performed       Past Medical History:  Diagnosis Date  . Elevated antinuclear antibody (ANA) level    Negative workup for Lupus.  . Hyperlipidemia   . Hypertension    Controlled  . Osteoarthritis   . Sciatica   . Vitamin D deficiency     Past Surgical History:  Procedure Laterality Date  . ABDOMINAL HYSTERECTOMY  2002  . CHOLECYSTECTOMY  2015  . TUBAL LIGATION  1984    There were no vitals filed for this visit.  Subjective Assessment - 10/25/17 1539    Subjective  Patient reports a little soreness, but reports that its not too bad; She reports doing some walking but then rested at her desk; She is still having intermittent back pain that radiates into posterior hips;     Pertinent History  Pt had a fall on 7/26 without apparent injury; shortly after she began having severe LBP the same day. She reports that she tripped after stepping in a hole while walking in gravel outside a restaurant. She fell onto her hands and knees and did not notice and injury initially. She has been experience numbness and tingling in BUEs and in [place] since. She reports she has never had this type of pain before, though she has had sciatica in the past and was treated by her chiropractor with success. Pain is alleviated with sitting and  elevating feet. Pt reports she is currently able to tolerate standing activities longer without unbearable pain. She reports after several hours she has pain that and progresses to numbness in BLE, but greater in posterior RLE.    Limitations  Standing;Walking;House hold activities;Lifting;Sitting    How long can you sit comfortably?  Unlimited unless already in pain    How long can you stand comfortably?  several hours    How long can you walk comfortably?  4-5 hours, has to sit after onset of pain    Diagnostic tests  MRI, [facility], 08/23/17: IMPRESSION: At L4-5 there is a minimal broad-based disc bulge. Mild     Currently in Pain?  Yes    Pain Score  2     Pain Location  Back    Pain Orientation  Lower    Pain Descriptors / Indicators  Aching;Sore    Pain Type  Chronic pain    Pain Radiating Towards  radiates into left hip;     Pain Onset  More than a month ago    Pain Frequency  Intermittent    Aggravating Factors   worse with prolonged walking/work tasks;     Pain Relieving Factors  rest/heat/pain meds;     Effect of Pain on Daily Activities  decreased activity tolerance;  [facility] PT Assessment - 10/25/17 0001      Observation/Other Assessments   Modified Oswertry  54% severe disability, more impaired from 09/27/17 which was 42% impaired;      Strength   Overall Strength Comments  BLE gross strength is 4+/5 with exception of hip extension grossly 3+/5         TREATMENT: PT performed soft tissue massage to bilateral posterior thigh/hips along gluteals and hamstrings x10 min utilizing rolling stick to help reduce tightness  Prone: Press ups on hands with cues to improve lumbar extension x10 reps;  Alternate hamstring curl 2# x15bilaterally with cues to avoid excessive knee flexion for less discomfort;  Gluteal max hip extension 2#, x15bilaterally with min A for positioning to improve hip strengthening;  Hip extension SLR 2#, x10bilaterally; with cues to  increase core stabilization and reduce back pain;  Patient required min VCs to avoid trunk rotation and improve core stabilization with LE movement for better tolerance and less back discomfort;   Patient in sidelying: Hip abduction clamshells green tband 2x15 with cues to avoid posterior rotation for better hip abductor strengthening;   PT assessed LE strength and progress towards goals, see above:  Patient denies any increase in pain at end of session; She does report increased muscle fatigue with advanced exercise; Patient educated on importance of stretches to avoid excessive soreness in glutes with advanced exercise;                    PT Education - 10/25/17 1549    Education provided  Yes    Education Details  LE strengthening, soft tissue massage, HEP reinforced;     Person(s) Educated  Patient    Methods  Explanation;Demonstration;Verbal cues    Comprehension  Verbalized understanding;Returned demonstration;Verbal cues required;Need further instruction       PT Short Term Goals - 09/27/17 1549      PT SHORT TERM GOAL #1   Title  Pt will show improved lumbar AROM by 6 degrees in all directions in order to improve body mechanics and decrease pain with functional mobility.     Time  4    Period  Weeks    Status  Achieved    Target Date  09/27/17      PT SHORT TERM GOAL #2   Title  Pt will perform HEP at least 3/7 days/wk in order to show meaningful improvement in strength and AROM for improved functional mobility and decreased pain.    Time  4    Period  Weeks    Status  Achieved    Target Date  09/27/17        PT Long Term Goals - 10/25/17 1550      PT LONG TERM GOAL #1   Title  Patient will be independent in home exercise program to improve strength/mobility for better functional independence with ADLs.    Time  8    Period  Weeks    Status  Partially Met    Target Date  11/22/17      PT LONG TERM GOAL #2   Title  Patient will increase BLE  gross strength to 4+/5 as to improve functional strength for independent gait, increased standing tolerance and increased ADL ability.    Time  8    Period  Weeks    Status  Partially Met    Target Date  11/22/17      PT LONG TERM GOAL #3   Title  Patient will report a worst pain of 3/10 on VAS in low back to improve tolerance with ADLs and reduced symptoms with activities.     Baseline  worst 9/10 in last week (as of 10/25/17)    Time  8    Period  Weeks    Status  Partially Met    Target Date  11/22/17      PT LONG TERM GOAL #4   Title   Patient will reduce Oswestry score to <20 as to demonstrate minimal disability with ADLs including improved sleeping tolerance, walking/sitting tolerance etc for better mobility with ADLs.     Baseline  52/100: Pt is 52% disabled based on her perception of her low back pain.    Time  8    Period  Weeks    Status  Partially Met    Target Date  11/22/17      PT LONG TERM GOAL #5   Title  Pt will show improved lumbar AROM by 12 degrees in all directions in order to improve body mechanics and decrease pain with functional mobility.     Time  8    Period  Weeks    Status  Achieved    Target Date  11/22/17            Plan - 10/25/17 1610    Clinical Impression Statement  Patient instructed in advanced LE strengthening; She has been having increased low back/posterior hip discomfort. This pain is not relieved with prone exericse, supine stretches or lumbar distraction; Patient does report slight relief with standing hamstring stretches; PT performed soft tissue massage to lumbar paraspinals and posteiror hip which seems to help some. Patient does exhibit improved strength in posterior hip but it is still weak at 3+/5; Patient did report increased disability on modified oswestry which I believe is related to increased soreness in hip; She is still not having severe radicular pain to foot which is improvement; Patient would benefit from additional  skilled PT intervention to improve strength and reduce pain with ADLs;     Rehab Potential  Good    Clinical Impairments Affecting Rehab Potential  High perceived disability despite reports of pain improving    PT Frequency  2x / week    PT Duration  8 weeks    PT Treatment/Interventions  ADLs/Self Care Home Management;Biofeedback;Moist Heat;Electrical Stimulation;Cryotherapy;Gait training;Therapeutic exercise;Therapeutic activities;Functional mobility training;Stair training;Neuromuscular re-education;Patient/family education;Manual techniques;Passive range of motion;Dry needling;Energy conservation;Traction    PT Next Visit Plan  Further assess core strength, advance HEP with general LE strengthening and extension based core strengthening exercise    PT Home Exercise Plan  Repeated lumbar extension in prone and standing    Consulted and Agree with Plan of Care  Patient       Patient will benefit from skilled therapeutic intervention in order to improve the following deficits and impairments:  Abnormal gait, Decreased activity tolerance, Decreased mobility, Decreased range of motion, Decreased strength, Difficulty walking, Hypomobility, Increased muscle spasms, Impaired perceived functional ability, Impaired flexibility, Impaired tone, Impaired sensation, Improper body mechanics, Pain  Visit Diagnosis: Low back pain with right-sided sciatica, unspecified back pain laterality, unspecified chronicity  Muscle weakness (generalized)  Difficulty in walking, not elsewhere classified     Problem List Patient Active Problem List   Diagnosis Date Noted  . Osteoarthritis 02/02/2017  . Preventative health care 02/02/2017  . Hyperlipidemia 02/02/2017  . Essential hypertension 11/22/2015    Ragena Fiola PT, DPT 10/25/2017, 4:20 PM  [place]  [place] MAIN [facility] SERVICES [place], [place], [address] Phone: [phone]   Fax:   [phone]  Name: Kaena Santori MRN: [id number] Date of Birth: [birth year]

## 2017-10-25 NOTE — Addendum Note (Signed)
Addended by: Blanche East E on: 10/25/2017 06:07 PM   Modules accepted: Orders

## 2017-10-30 ENCOUNTER — Ambulatory Visit: Payer: 59 | Admitting: Physical Therapy

## 2017-10-30 ENCOUNTER — Encounter: Payer: Self-pay | Admitting: Physical Therapy

## 2017-10-30 DIAGNOSIS — R262 Difficulty in walking, not elsewhere classified: Secondary | ICD-10-CM

## 2017-10-30 DIAGNOSIS — M6281 Muscle weakness (generalized): Secondary | ICD-10-CM

## 2017-10-30 DIAGNOSIS — M5441 Lumbago with sciatica, right side: Secondary | ICD-10-CM | POA: Diagnosis not present

## 2017-10-30 NOTE — Therapy (Signed)
[place] MAIN [facility] SERVICES [address] [place], [place], [address] Phone: [phone]   Fax:  [phone]  Physical Therapy Treatment  Patient Details  Name: Samantha [name] MRN: [id number] Date of Birth: [birth year] Referring Provider: Alma Friendly NP   Encounter Date: 10/30/2017  PT End of Session - 10/30/17 1603    Visit Number  16    Number of Visits  25    Date for PT Re-Evaluation  11/22/17    PT Start Time  6962    PT Stop Time  1630    PT Time Calculation (min)  41 min    Activity Tolerance  Patient tolerated treatment well;[name] increased pain    Behavior During Therapy  WFL for tasks assessed/performed       Past Medical History:  Diagnosis Date  . Elevated antinuclear antibody (ANA) level    Negative workup for Lupus.  . Hyperlipidemia   . Hypertension    Controlled  . Osteoarthritis   . Sciatica   . Vitamin D deficiency     Past Surgical History:  Procedure Laterality Date  . ABDOMINAL HYSTERECTOMY  2002  . CHOLECYSTECTOMY  2015  . TUBAL LIGATION  1984    There were [name] vitals filed for this visit.  Subjective Assessment - 10/30/17 1559    Subjective  Patient reports increased soreness today; She reports Sunday she went to [facility] and went to [employer] and did some shopping for about 30 min and reported increased posterior hip pain; Patient reports walking around [employer] while wearing boots with small heel which could have contributed to her symptoms. She reports that she has been working more; She stood about 2 hours last night in the kitchen making a soup; She reports that the increased activity has contributed to increased pain;     Pertinent History  Pt had a fall on 7/26 without apparent injury; shortly after she began having severe LBP the same day. She reports that she tripped after stepping in a hole while walking in gravel outside a restaurant. She fell onto her hands and knees and did not notice and injury  initially. She has been experience numbness and tingling in BUEs and in [place] since. She reports she has never had this type of pain before, though she has had sciatica in the past and was treated by her chiropractor with success. Pain is alleviated with sitting and elevating feet. Pt reports she is currently able to tolerate standing activities longer without unbearable pain. She reports after several hours she has pain that and progresses to numbness in BLE, but greater in posterior RLE.    Limitations  Standing;Walking;House hold activities;Lifting;Sitting    How long can you sit comfortably?  Unlimited unless already in pain    How long can you stand comfortably?  several hours    How long can you walk comfortably?  4-5 hours, has to sit after onset of pain    Diagnostic tests  MRI, [facility], 08/23/17: IMPRESSION: At L4-5 there is a minimal broad-based disc bulge. Mild     Currently in Pain?  Yes    Pain Score  4     Pain Location  Back    Pain Orientation  Lower    Pain Descriptors / Indicators  Aching;Sore    Pain Type  Chronic pain    Pain Radiating Towards  radiates into posterior hip;     Pain Onset  More than a month ago  Pain Frequency  Intermittent    Aggravating Factors   worse with prolonged walking/work tasks;     Pain Relieving Factors  rest/heat/pain meds    Effect of Pain on Daily Activities  decreased activity tolerance;     Multiple Pain Sites  [name]            TREATMENT: PT performed soft tissue massage to bilateral posterior thigh/hips along gluteals and hamstrings and sacral paraspinals x10 minutilizing rolling stickto help reduce tightness  Prone: Exercise concurrent with cryotherapy to lumbar paraspinals; educated patient on when to ice vs heat for better results;  Alternate hamstring curl 2# 2 x15bilaterally with cues to avoid excessive knee flexion for less discomfort;  Gluteal max hip extension 2#, x15bilaterally with min A for positioning to improve  hip strengthening;  Hip extension SLR 2#, x12bilaterally; with cues to increase core stabilization and reduce back pain;  Patient required min VCs to avoid trunk rotation and improve core stabilization with LE movement for better tolerance and less back discomfort;   Patient in sidelying: Hip abduction clamshells green tband 2x15 with cues to avoid posterior rotation for better hip abductor strengthening;  Sitting piriformis stretch 20 sec hold x2 reps with cues for positioning for better stretch;  Reinforced HEP and importance of ice/heat and LE stretches;                   PT Education - 10/30/17 1602    Education provided  Yes    Education Details  LE strengthening, soft tissue massage, HEP reinforced;     Person(s) Educated  Patient    Methods  Explanation;Demonstration;Verbal cues    Comprehension  Verbalized understanding;Verbal cues required;Returned demonstration;Need further instruction       PT Short Term Goals - 09/27/17 1549      PT SHORT TERM GOAL #1   Title  Pt will show improved lumbar AROM by 6 degrees in all directions in order to improve body mechanics and decrease pain with functional mobility.     Time  4    Period  Weeks    Status  Achieved    Target Date  09/27/17      PT SHORT TERM GOAL #2   Title  Pt will perform HEP at least 3/7 days/wk in order to show meaningful improvement in strength and AROM for improved functional mobility and decreased pain.    Time  4    Period  Weeks    Status  Achieved    Target Date  09/27/17        PT Long Term Goals - 10/25/17 1550      PT LONG TERM GOAL #1   Title  Patient will be independent in home exercise program to improve strength/mobility for better functional independence with ADLs.    Time  4    Period  Weeks    Status  Partially Met    Target Date  11/22/17      PT LONG TERM GOAL #2   Title  Patient will increase BLE gross strength to 4+/5 as to improve functional strength for  independent gait, increased standing tolerance and increased ADL ability.    Time  4    Period  Weeks    Status  Partially Met    Target Date  11/22/17      PT LONG TERM GOAL #3   Title  Patient will report a worst pain of 3/10 on VAS in low back to improve tolerance  with ADLs and reduced symptoms with activities.     Baseline  worst 9/10 in last week (as of 10/25/17)    Time  4    Period  Weeks    Status  Partially Met    Target Date  11/22/17      PT LONG TERM GOAL #4   Title   Patient will reduce Oswestry score to <20 as to demonstrate minimal disability with ADLs including improved sleeping tolerance, walking/sitting tolerance etc for better mobility with ADLs.     Baseline  52/100: Pt is 52% disabled based on her perception of her low back pain.    Time  4    Period  Weeks    Status  Partially Met    Target Date  11/22/17      PT LONG TERM GOAL #5   Title  Pt will show improved lumbar AROM by 12 degrees in all directions in order to improve body mechanics and decrease pain with functional mobility.     Time  4    Period  Weeks    Status  Achieved    Target Date  11/22/17            Plan - 10/30/17 1613    Clinical Impression Statement  Patient reports increased lumbar discomfort as a result of over-doing it with prolonged standing and walking; educated patient in importance and safety with heat/ice for best pain relief. PT performed soft tissue massage to bilateral LE posterior hip and low back with mild relief. Patient most tender along sacral paraspinals; Patient instructed in advanced LE posterior hip/lumbar strengthening exercise; Patient requires min VCs for correct positioning to avoid trunk rotation and increase core stabilization for better ROM and less discomfort with leg lifts; She would benefit from additional skilled PT intervention to improve strength and reduce pain with ADLs;     Rehab Potential  Good    Clinical Impairments Affecting Rehab Potential  High  perceived disability despite reports of pain improving    PT Frequency  2x / week    PT Duration  4 weeks    PT Treatment/Interventions  ADLs/Self Care Home Management;Biofeedback;Moist Heat;Electrical Stimulation;Cryotherapy;Gait training;Therapeutic exercise;Therapeutic activities;Functional mobility training;Stair training;Neuromuscular re-education;Patient/family education;Manual techniques;Passive range of motion;Dry needling;Energy conservation;Traction    PT Next Visit Plan  Further assess core strength, advance HEP with general LE strengthening and extension based core strengthening exercise    PT Home Exercise Plan  Repeated lumbar extension in prone and standing    Consulted and Agree with Plan of Care  Patient       Patient will benefit from skilled therapeutic intervention in order to improve the following deficits and impairments:  Abnormal gait, Decreased activity tolerance, Decreased mobility, Decreased range of motion, Decreased strength, Difficulty walking, Hypomobility, Increased muscle spasms, Impaired perceived functional ability, Impaired flexibility, Impaired tone, Impaired sensation, Improper body mechanics, Pain  Visit Diagnosis: Low back pain with right-sided sciatica, unspecified back pain laterality, unspecified chronicity  Muscle weakness (generalized)  Difficulty in walking, not elsewhere classified     Problem List Patient Active Problem List   Diagnosis Date Noted  . Osteoarthritis 02/02/2017  . Preventative health care 02/02/2017  . Hyperlipidemia 02/02/2017  . Essential hypertension 11/22/2015    Trotter,MargaretPT, DPT 10/30/2017, 4:29 PM  [place] MAIN [facility] SERVICES [address] [place], [place], [address] Phone: [phone]   Fax:  [phone]  Name: Samantha [name] MRN: [id number] Date of Birth: May 22, 1961

## 2017-10-31 ENCOUNTER — Encounter: Payer: Self-pay | Admitting: Primary Care

## 2017-10-31 ENCOUNTER — Ambulatory Visit: Payer: 59 | Admitting: Primary Care

## 2017-10-31 DIAGNOSIS — M5441 Lumbago with sciatica, right side: Secondary | ICD-10-CM

## 2017-10-31 DIAGNOSIS — G8929 Other chronic pain: Secondary | ICD-10-CM | POA: Diagnosis not present

## 2017-10-31 DIAGNOSIS — M545 Low back pain, unspecified: Secondary | ICD-10-CM | POA: Insufficient documentation

## 2017-10-31 NOTE — Patient Instructions (Signed)
We will extend your light duty through January 6th with a return to full duty on January 7th.  Continue with physical therapy.  Try black cohosh for hot flashes, this can be purchased over the counter.  It was a pleasure to see you today!

## 2017-10-31 NOTE — Progress Notes (Signed)
Subjective:    Patient ID: Samantha Hall, female    DOB: 12-07-1961, 56 y.o.   MRN: [id number]  HPI  Samantha Hall is a 56 year old female who presents today for follow up of back pain.  History of right sided back pain with radiation to right pelvis/hips since a fall in late July 2018. She's undergone Urgent Care evaluation, plain film evaluation, chiropractic evaluation, and physical therapy. She's also been out on short leave.   She is currently still following with physical therapy who has recently extended. She's attending PT twice weekly and starting this week she'll start once weekly. She's also doing home exercises. She's continued to have difficulty standing longer than 1.5 hours as she will experience right lower back and hip pain. She thinks she's slowly improving but isn't back to baseline. The radiation to her right lower extremity has improved. She is requesting an extension of her light duty at work with a return to full duty on Monday January 7th.   Review of Systems  Musculoskeletal: Positive for arthralgias and back pain.  Skin: Negative for color change.  Neurological: Positive for weakness. Negative for numbness.       Past Medical History:  Diagnosis Date  . Elevated antinuclear antibody (ANA) level    Negative workup for Lupus.  . Hyperlipidemia   . Hypertension    Controlled  . Osteoarthritis   . Sciatica   . Vitamin D deficiency      Social History   Socioeconomic History  . Marital status: Married    Spouse name: Not on file  . Number of children: Not on file  . Years of education: Not on file  . Highest education level: Not on file  Social Needs  . Financial resource strain: Not on file  . Food insecurity - worry: Not on file  . Food insecurity - inability: Not on file  . Transportation needs - medical: Not on file  . Transportation needs - non-medical: Not on file  Occupational History  . Not on file  Tobacco Use  . Smoking status: Never  Smoker  . Smokeless tobacco: Never Used  Substance and Sexual Activity  . Alcohol use: Yes    Alcohol/week: 0.0 oz    Comment: sociall  . Drug use: No  . Sexual activity: Not on file  Other Topics Concern  . Not on file  Social History Narrative   Married.   Highest level of education Bachelors.    Works as a Education officer, [employer].    Past Surgical History:  Procedure Laterality Date  . ABDOMINAL HYSTERECTOMY  2002  . CHOLECYSTECTOMY  2015  . TUBAL LIGATION  1984    No family history on file.  Allergies  Allergen Reactions  . Cefazolin Swelling    Difficulty breathing  . Sulfa Antibiotics     Current Outpatient Medications on File Prior to Visit  Medication Sig Dispense Refill  . amLODipine (NORVASC) 5 MG tablet Take 1 tablet (5 mg total) by mouth daily. 90 tablet 3  . cyclobenzaprine (FLEXERIL) 10 MG tablet Take 10 mg by mouth 3 (three) times daily as needed for muscle spasms.    Marland Kitchen HYDROcodone-acetaminophen (NORCO/VICODIN) 5-325 MG tablet Take 1 tablet by mouth every 6 (six) hours as needed for moderate pain.    Marland Kitchen ibuprofen (ADVIL,MOTRIN) 200 MG tablet Take 400 mg every 6 (six) hours as needed by mouth for moderate pain.    . TURMERIC PO Take 4 capsules by mouth daily.  No current facility-administered medications on file prior to visit.     BP 134/80   Pulse 69   Temp 98.2 F (36.8 C) (Oral)   Ht 5\' 5"  (1.651 m)   Wt 173 lb 12.8 oz (78.8 kg)   SpO2 95%   BMI 28.92 kg/m    Objective:   Physical Exam  Constitutional: She appears well-nourished.  Musculoskeletal:       Right hip: She exhibits decreased range of motion.       Left hip: She exhibits normal range of motion.       Back:  Lower extremity strength equal bilaterally at sitting position. Right lower back and hip pain. Left hip pain.  Neurological: She has normal reflexes.  Skin: Skin is warm and dry.          Assessment & Plan:

## 2017-10-31 NOTE — Assessment & Plan Note (Signed)
Since late July 2018 after fall. Has undergone Urgent Care and PCP evaluation, chiropractor evaluation, and is currently following with PT. MRI in September unremarkable.  Will extend light duty through January 6th with a return to full duty on January 7th as her PT was extended until mid to late December. She will update if anything changes. Overall seems to be improving.

## 2017-11-05 ENCOUNTER — Encounter: Payer: Self-pay | Admitting: Primary Care

## 2017-11-05 NOTE — Telephone Encounter (Signed)
See My Chart message regarding light duty. Can you help? I approve her extension of light duty, see my last office visit note.

## 2017-11-06 ENCOUNTER — Ambulatory Visit: Payer: 59 | Admitting: Physical Therapy

## 2017-11-06 ENCOUNTER — Encounter: Payer: Self-pay | Admitting: Physical Therapy

## 2017-11-06 DIAGNOSIS — M6281 Muscle weakness (generalized): Secondary | ICD-10-CM

## 2017-11-06 DIAGNOSIS — R262 Difficulty in walking, not elsewhere classified: Secondary | ICD-10-CM

## 2017-11-06 DIAGNOSIS — M5441 Lumbago with sciatica, right side: Secondary | ICD-10-CM | POA: Diagnosis not present

## 2017-11-06 NOTE — Therapy (Signed)
[place] MAIN [facility] SERVICES [address] [place], [place], [address] Phone: [phone]   Fax:  [phone]  Physical Therapy Treatment  Patient Details  Name: Samantha Hall MRN: [id number] Date of Birth: Sep 03, 1961 Referring Provider: Alma Friendly NP   Encounter Date: 11/06/2017  PT End of Session - 11/06/17 1610    Visit Number  17    Number of Visits  25    Date for PT Re-Evaluation  11/22/17    PT Start Time  1558    PT Stop Time  1630    PT Time Calculation (min)  32 min    Activity Tolerance  Patient tolerated treatment well;No increased pain    Behavior During Therapy  WFL for tasks assessed/performed       Past Medical History:  Diagnosis Date  . Elevated antinuclear antibody (ANA) level    Negative workup for Lupus.  . Hyperlipidemia   . Hypertension    Controlled  . Osteoarthritis   . Sciatica   . Vitamin D deficiency     Past Surgical History:  Procedure Laterality Date  . ABDOMINAL HYSTERECTOMY  2002  . CHOLECYSTECTOMY  2015  . TUBAL LIGATION  1984    There were no vitals filed for this visit.  Subjective Assessment - 11/06/17 1602    Subjective  Patient reports increased soreness yesterday; She reports having pain last weekend with increased pain on friday. She reports that she has stiffness in low back with pain radiating to lateral hips; She reports feeling increased spasms. She went to a meeting Saturday and after 1 hour she was feeling increased pain; She reports that with rest it does ease off. She reports increased pain down back of legs on Sunday; she reports feeling better this morning after resting some yesterday;     Pertinent History  Pt had a fall on 7/26 without apparent injury; shortly after she began having severe LBP the same day. She reports that she tripped after stepping in a hole while walking in gravel outside a restaurant. She fell onto her hands and knees and did not notice and injury  initially. She has been experience numbness and tingling in BUEs and in [place] since. She reports she has never had this type of pain before, though she has had sciatica in the past and was treated by her chiropractor with success. Pain is alleviated with sitting and elevating feet. Pt reports she is currently able to tolerate standing activities longer without unbearable pain. She reports after several hours she has pain that and progresses to numbness in BLE, but greater in posterior RLE.    Limitations  Standing;Walking;House hold activities;Lifting;Sitting    How long can you sit comfortably?  Unlimited unless already in pain    How long can you stand comfortably?  several hours    How long can you walk comfortably?  4-5 hours, has to sit after onset of pain    Diagnostic tests  MRI, [facility], 08/23/17: IMPRESSION: At L4-5 there is a minimal broad-based disc bulge. Mild     Currently in Pain?  Yes    Pain Score  3     Pain Location  Back    Pain Orientation  Lower    Pain Descriptors / Indicators  Aching;Sore    Pain Type  Chronic pain    Pain Radiating Towards  radiates into hip;     Pain Onset  More than a month ago    Pain  Frequency  Intermittent    Aggravating Factors   worse with prolonged walking/work tasks;     Pain Relieving Factors  rest/heat/pain meds    Effect of Pain on Daily Activities  decreased activity tolerance;     Multiple Pain Sites  No            TREATMENT: PT performed soft tissue massage to bilateral posterior thigh/hips along gluteals and hamstrings and sacral paraspinals x63mnutilizing rolling stickto help reduce tightness  Prone: Exercise concurrent with cryotherapy to lumbar paraspinals; educated patient on when to ice vs heat for better results;  Prone press ups with hands 3x10 reps with cues to relax hips for better lumbar ROM to improve centralization of symptoms;  Alternate hamstring curl x15, 2# x15bilaterally with cues to avoid excessive knee  flexion for less discomfort;  Gluteal max hip extension , x15bilaterally with min cues for positioning to improve hip strengthening;  Hip extension SLR , 2x15bilaterally; with cues to increase core stabilization and reduce back pain; Patient required min VCs to avoid trunk rotation and improve core stabilization with LE movement for better tolerance and less back discomfort;   Patient in sidelying: Hip abduction clamshells green tband x15with cues to avoid posterior rotation for better hip abductor strengthening;   Reinforced HEP and importance of ice/heat and LE stretches; educated patient on pain properties and importance of doing lumbar extension when pain starts to reduce severity and duration of pain;                      PT Education - 11/06/17 1610    Education provided  Yes    Education Details  LE strengthening, soft tissue massage/HEP reinforced;     Person(s) Educated  Patient    Methods  Explanation;Demonstration;Verbal cues    Comprehension  Verbalized understanding;Returned demonstration;Verbal cues required;Need further instruction       PT Short Term Goals - 09/27/17 1549      PT SHORT TERM GOAL #1   Title  Pt will show improved lumbar AROM by 6 degrees in all directions in order to improve body mechanics and decrease pain with functional mobility.     Time  4    Period  Weeks    Status  Achieved    Target Date  09/27/17      PT SHORT TERM GOAL #2   Title  Pt will perform HEP at least 3/7 days/wk in order to show meaningful improvement in strength and AROM for improved functional mobility and decreased pain.    Time  4    Period  Weeks    Status  Achieved    Target Date  09/27/17        PT Long Term Goals - 10/25/17 1550      PT LONG TERM GOAL #1   Title  Patient will be independent in home exercise program to improve strength/mobility for better functional independence with ADLs.    Time  4    Period  Weeks    Status   Partially Met    Target Date  11/22/17      PT LONG TERM GOAL #2   Title  Patient will increase BLE gross strength to 4+/5 as to improve functional strength for independent gait, increased standing tolerance and increased ADL ability.    Time  4    Period  Weeks    Status  Partially Met    Target Date  11/22/17  PT LONG TERM GOAL #3   Title  Patient will report a worst pain of 3/10 on VAS in low back to improve tolerance with ADLs and reduced symptoms with activities.     Baseline  worst 9/10 in last week (as of 10/25/17)    Time  4    Period  Weeks    Status  Partially Met    Target Date  11/22/17      PT LONG TERM GOAL #4   Title   Patient will reduce Oswestry score to <20 as to demonstrate minimal disability with ADLs including improved sleeping tolerance, walking/sitting tolerance etc for better mobility with ADLs.     Baseline  52/100: Pt is 52% disabled based on her perception of her low back pain.    Time  4    Period  Weeks    Status  Partially Met    Target Date  11/22/17      PT LONG TERM GOAL #5   Title  Pt will show improved lumbar AROM by 12 degrees in all directions in order to improve body mechanics and decrease pain with functional mobility.     Time  4    Period  Weeks    Status  Achieved    Target Date  11/22/17            Plan - 11/06/17 1610    Clinical Impression Statement  Patient reports increased soreness in low back with radiating pain to lateral hips; PT applied cryotherapy concurrent with prone exercise. PT instructed patient in lumbar extension and LE strengthening with less resistance to assess tolerance. Patient reports increased pain to lateral hips with PA mobs to lumbar spine (discontinued); She does report less aching pain in BLE hips following exercise without resistance; She would benefit from additional skilled PT intervention to improve strength and lumbar mobility with less pain;     Rehab Potential  Good    Clinical Impairments  Affecting Rehab Potential  High perceived disability despite reports of pain improving    PT Frequency  2x / week    PT Duration  4 weeks    PT Treatment/Interventions  ADLs/Self Care Home Management;Biofeedback;Moist Heat;Electrical Stimulation;Cryotherapy;Gait training;Therapeutic exercise;Therapeutic activities;Functional mobility training;Stair training;Neuromuscular re-education;Patient/family education;Manual techniques;Passive range of motion;Dry needling;Energy conservation;Traction    PT Next Visit Plan  Further assess core strength, advance HEP with general LE strengthening and extension based core strengthening exercise    PT Home Exercise Plan  Repeated lumbar extension in prone and standing    Consulted and Agree with Plan of Care  Patient       Patient will benefit from skilled therapeutic intervention in order to improve the following deficits and impairments:  Abnormal gait, Decreased activity tolerance, Decreased mobility, Decreased range of motion, Decreased strength, Difficulty walking, Hypomobility, Increased muscle spasms, Impaired perceived functional ability, Impaired flexibility, Impaired tone, Impaired sensation, Improper body mechanics, Pain  Visit Diagnosis: Low back pain with right-sided sciatica, unspecified back pain laterality, unspecified chronicity  Muscle weakness (generalized)  Difficulty in walking, not elsewhere classified     Problem List Patient Active Problem List   Diagnosis Date Noted  . Chronic low back pain 10/31/2017  . Osteoarthritis 02/02/2017  . Preventative health care 02/02/2017  . Hyperlipidemia 02/02/2017  . Essential hypertension 11/22/2015    Trotter,Margaret PT, DPT 11/06/2017, 4:27 PM  [place] MAIN [facility] SERVICES [address] [place], [place], [address] Phone: [phone]   Fax:  [phone]  Name: Samantha Hall MRN: [id number] Date of Birth: 23-Dec-1960

## 2017-11-14 ENCOUNTER — Ambulatory Visit: Payer: 59 | Admitting: Physical Therapy

## 2017-11-19 ENCOUNTER — Encounter: Payer: Self-pay | Admitting: Physical Therapy

## 2017-11-19 ENCOUNTER — Encounter: Payer: Self-pay | Admitting: Primary Care

## 2017-11-19 DIAGNOSIS — I10 Essential (primary) hypertension: Secondary | ICD-10-CM

## 2017-11-19 DIAGNOSIS — M5441 Lumbago with sciatica, right side: Secondary | ICD-10-CM

## 2017-11-19 DIAGNOSIS — G8929 Other chronic pain: Secondary | ICD-10-CM

## 2017-11-20 MED ORDER — HYDROCHLOROTHIAZIDE 12.5 MG PO CAPS: 12.5000 mg | ORAL_CAPSULE | Freq: Every day | ORAL | 0 refills | Status: DC

## 2017-11-21 ENCOUNTER — Encounter: Payer: Self-pay | Admitting: Physical Therapy

## 2017-11-21 ENCOUNTER — Ambulatory Visit: Payer: 59 | Attending: Primary Care | Admitting: Physical Therapy

## 2017-11-21 DIAGNOSIS — R262 Difficulty in walking, not elsewhere classified: Secondary | ICD-10-CM | POA: Diagnosis present

## 2017-11-21 DIAGNOSIS — M6281 Muscle weakness (generalized): Secondary | ICD-10-CM | POA: Diagnosis present

## 2017-11-21 DIAGNOSIS — M5441 Lumbago with sciatica, right side: Secondary | ICD-10-CM | POA: Diagnosis present

## 2017-11-21 MED ORDER — DICLOFENAC SODIUM 75 MG PO TBEC: 75.0000 mg | DELAYED_RELEASE_TABLET | Freq: Two times a day (BID) | ORAL | 0 refills | Status: DC

## 2017-11-21 MED ORDER — CYCLOBENZAPRINE HCL 10 MG PO TABS: 10.0000 mg | ORAL_TABLET | Freq: Three times a day (TID) | ORAL | 0 refills | Status: DC | PRN

## 2017-11-21 NOTE — Therapy (Signed)
[place] MAIN [facility] SERVICES [address] [place], [place], [address] Phone: [phone]   Fax:  [phone]  Physical Therapy Treatment/Discharge Summary  Patient Details  Name: Samantha Hall MRN: [id number] Date of Birth: January 04, 1961 Referring Provider: Alma Friendly NP   Encounter Date: 11/21/2017  PT End of Session - 11/21/17 1753    Visit Number  18    Number of Visits  25    Date for PT Re-Evaluation  11/22/17    PT Start Time  0102    PT Stop Time  1820    PT Time Calculation (min)  32 min    Activity Tolerance  Patient tolerated treatment well;No increased pain    Behavior During Therapy  WFL for tasks assessed/performed       Past Medical History:  Diagnosis Date  . Elevated antinuclear antibody (ANA) level    Negative workup for Lupus.  . Hyperlipidemia   . Hypertension    Controlled  . Osteoarthritis   . Sciatica   . Vitamin D deficiency     Past Surgical History:  Procedure Laterality Date  . ABDOMINAL HYSTERECTOMY  2002  . CHOLECYSTECTOMY  2015  . TUBAL LIGATION  1984    There were no vitals filed for this visit.  Subjective Assessment - 11/21/17 1751    Subjective  Patient reports increased pain last week with waking up Thursday night with pain down right leg. She reports having to get heating pad to help with pain. she reports not being sure why the pain was bad on Thursday, however she was working late on Wednesday which could have contributing; She reports still being sore today but its mostly in posterior hip;     Pertinent History  Pt had a fall on 7/26 without apparent injury; shortly after she began having severe LBP the same day. She reports that she tripped after stepping in a hole while walking in gravel outside a restaurant. She fell onto her hands and knees and did not notice and injury initially. She has been experience numbness and tingling in [place] and in [place] since. She reports she has never had  this type of pain before, though she has had sciatica in the past and was treated by her chiropractor with success. Pain is alleviated with sitting and elevating feet. Pt reports she is currently able to tolerate standing activities longer without unbearable pain. She reports after several hours she has pain that and progresses to numbness in BLE, but greater in posterior RLE.    Limitations  Standing;Walking;House hold activities;Lifting;Sitting    How long can you sit comfortably?  Unlimited unless already in pain    How long can you stand comfortably?  several hours    How long can you walk comfortably?  4-5 hours, has to sit after onset of pain    Diagnostic tests  MRI, [place] [facility], 08/23/17: IMPRESSION: At L4-5 there is a minimal broad-based disc bulge. Mild     Currently in Pain?  Yes    Pain Score  3     Pain Location  Back    Pain Orientation  Lower    Pain Descriptors / Indicators  Aching;Sore    Pain Type  Chronic pain    Pain Onset  More than a month ago    Pain Frequency  Intermittent    Aggravating Factors   worse with prolonged walking/standing- able to walk for 45 min before having increased pain;  Pain Relieving Factors  rest/heat pain meds    Effect of Pain on Daily Activities  decreased activity tolerance;     Multiple Pain Sites  No         [facility] PT Assessment - 11/22/17 0001      Observation/Other Assessments   Modified Oswertry  50% (severe disability, no significant change from 10/25/17 which was 54%)      Strength   Overall Strength Comments  BLE is WFL; hip extension grossly 4/5, lumbar extension grossly 3+/5      TREATMENT: PT performed soft tissue massage to bilateral posterior thigh/hips along gluteals and hamstringsand sacral [id number] rolling stickto help reduce tightness; patient often shifting in prone trying to relieve back discomfort;   Prone:  Prone press ups with hands 3x10 reps with cues to relax hips for better lumbar ROM  to improve centralization of symptoms;   PT assessed goals, instructing patient in Modified Oswestry, assessing LE strength; see above;   Reinforced HEP and importance of ice/heat and LE stretches;educated patient on pain properties and importance of doing lumbar extension when pain starts to reduce severity and duration of pain;  Patient has made some progress towards goals, however has shown a plateau in last several weeks with no change in pain; Patient reports adherence to HEP but patient persists; Recommend patient follow up with MD as she is not responding to conservative treatment;                      PT Education - 11/21/17 1753    Education provided  Yes    Education Details  LE strengthening, soft tissue massage, HEP reinforced;     Person(s) Educated  Patient    Methods  Explanation;Demonstration;Verbal cues    Comprehension  Verbalized understanding;Returned demonstration;Verbal cues required;Need further instruction       PT Short Term Goals - 09/27/17 1549      PT SHORT TERM GOAL #1   Title  Pt will show improved lumbar AROM by 6 degrees in all directions in order to improve body mechanics and decrease pain with functional mobility.     Time  4    Period  Weeks    Status  Achieved    Target Date  09/27/17      PT SHORT TERM GOAL #2   Title  Pt will perform HEP at least 3/7 days/wk in order to show meaningful improvement in strength and AROM for improved functional mobility and decreased pain.    Time  4    Period  Weeks    Status  Achieved    Target Date  09/27/17        PT Long Term Goals - 11/21/17 1812      PT LONG TERM GOAL #1   Title  Patient will be independent in home exercise program to improve strength/mobility for better functional independence with ADLs.    Time  4    Period  Weeks    Status  Partially Met    Target Date  11/22/17      PT LONG TERM GOAL #2   Title  Patient will increase BLE gross strength to 4+/5 as to  improve functional strength for independent gait, increased standing tolerance and increased ADL ability.    Time  4    Period  Weeks    Status  Partially Met    Target Date  11/22/17      PT LONG TERM GOAL #3  Title  Patient will report a worst pain of 3/10 on VAS in low back to improve tolerance with ADLs and reduced symptoms with activities.     Baseline  worst 5/10 in last week (as of 11/21/17)    Time  4    Period  Weeks    Status  Partially Met    Target Date  11/22/17      PT LONG TERM GOAL #4   Title   Patient will reduce Oswestry score to <20 as to demonstrate minimal disability with ADLs including improved sleeping tolerance, walking/sitting tolerance etc for better mobility with ADLs.     Baseline   Pt is 50% disabled based on her perception of her low back pain.    Time  4    Period  Weeks    Status  Partially Met    Target Date  11/22/17      PT LONG TERM GOAL #5   Title  Pt will show improved lumbar AROM by 12 degrees in all directions in order to improve body mechanics and decrease pain with functional mobility.     Time  4    Period  Weeks    Status  Achieved    Target Date  11/22/17            Plan - 11/21/17 1807    Clinical Impression Statement  Patient is responding slowly to therapy; She is still having increased low back pain which is not relieved with soft tissue massage, stretches, lumbar distraction; She has improved in posterior LE strength and back strength but her pain persists. Concerned about patient continuing to have bouts of LE pain and numbness to toes. She reports that this is intermittent, however at this point in rehab she should not be having numbness to feet if responding well to exercise. Patient appears to be adherent to HEP however does admit that she doesn't do much activity when feeling increased low back pain. She is limited to standing/walking for 45 min at a time before feeling increased discomfort. Patient has made progress towards  goals but has not met them. She shows no significant change on Modified Oswestry score. Therefore, recommend patient follow up with MD for additional test/treatment options as she is not responding to conservative therapy; Patient will be discharged at this time- she is agreeable;     Rehab Potential  Good    Clinical Impairments Affecting Rehab Potential  High perceived disability despite reports of pain improving    PT Frequency  2x / week    PT Duration  4 weeks    PT Treatment/Interventions  ADLs/Self Care Home Management;Biofeedback;Moist Heat;Electrical Stimulation;Cryotherapy;Gait training;Therapeutic exercise;Therapeutic activities;Functional mobility training;Stair training;Neuromuscular re-education;Patient/family education;Manual techniques;Passive range of motion;Dry needling;Energy conservation;Traction    PT Next Visit Plan  Further assess core strength, advance HEP with general LE strengthening and extension based core strengthening exercise    PT Home Exercise Plan  Repeated lumbar extension in prone and standing    Consulted and Agree with Plan of Care  Patient       Patient will benefit from skilled therapeutic intervention in order to improve the following deficits and impairments:  Abnormal gait, Decreased activity tolerance, Decreased mobility, Decreased range of motion, Decreased strength, Difficulty walking, Hypomobility, Increased muscle spasms, Impaired perceived functional ability, Impaired flexibility, Impaired tone, Impaired sensation, Improper body mechanics, Pain  Visit Diagnosis: Low back pain with right-sided sciatica, unspecified back pain laterality, unspecified chronicity  Muscle weakness (generalized)  Difficulty in  walking, not elsewhere classified     Problem List Patient Active Problem List   Diagnosis Date Noted  . Chronic low back pain 10/31/2017  . Osteoarthritis 02/02/2017  . Preventative health care 02/02/2017  . Hyperlipidemia 02/02/2017  .  Essential hypertension 11/22/2015    Anelly Samarin PT, DPT 11/22/2017, 7:53 AM  [place] MAIN [facility] SERVICES [address] [place], [place], [address] Phone: [phone]   Fax:  [phone]  Name: Samantha Hall MRN: [id number] Date of Birth: 1961-07-06

## 2017-12-05 ENCOUNTER — Telehealth: Payer: Self-pay | Admitting: Primary Care

## 2017-12-05 ENCOUNTER — Ambulatory Visit: Payer: 59 | Admitting: Primary Care

## 2017-12-05 VITALS — BP 122/82 | HR 78 | Temp 98.2°F | Ht 65.0 in | Wt 174.1 lb

## 2017-12-05 DIAGNOSIS — M5441 Lumbago with sciatica, right side: Secondary | ICD-10-CM | POA: Diagnosis not present

## 2017-12-05 DIAGNOSIS — F418 Other specified anxiety disorders: Secondary | ICD-10-CM | POA: Diagnosis not present

## 2017-12-05 DIAGNOSIS — I10 Essential (primary) hypertension: Secondary | ICD-10-CM

## 2017-12-05 DIAGNOSIS — G8929 Other chronic pain: Secondary | ICD-10-CM

## 2017-12-05 LAB — BASIC METABOLIC PANEL
BUN: 20 mg/dL (ref 6–23)
CO2: 32 mEq/L (ref 19–32)
Calcium: 9.3 mg/dL (ref 8.4–10.5)
Chloride: 102 mEq/L (ref 96–112)
Creatinine, Ser: 1.11 mg/dL (ref 0.40–1.20)
GFR: 65.04 mL/min (ref >60.00)
Glucose, Bld: 99 mg/dL (ref 70–99)
Potassium: 3.8 mEq/L (ref 3.5–5.1)
Sodium: 139 mEq/L (ref 135–145)

## 2017-12-05 MED ORDER — HYDROXYZINE HCL 10 MG PO TABS: ORAL_TABLET | ORAL | 0 refills | Status: DC

## 2017-12-05 NOTE — Patient Instructions (Signed)
You may use the hydroxyzine 10 mg capsules as needed for anxiety/panic attacks. Caution as they may cause drowsiness.  Continue home exercises and stretching for your back.   Continue hydrochlorothiazide 12.5 mg capsules for high blood pressure.  Stop by the lab prior to leaving today. I will notify you of your results once received.   It was a pleasure to see you today!

## 2017-12-05 NOTE — Assessment & Plan Note (Signed)
Overall feeling better, seems to be continuously improving on a slow pace. Discharged from PT in early December. Continue home exercises and slowly advance activity. Continue diclofenac and cyclobenzaprine PRN. Consider neurosurgery evaluation if no continued improvement in 3-6 months.

## 2017-12-05 NOTE — Telephone Encounter (Signed)
Yes, she would be eligible for a bone density test.  Does she wish to have this done in [place] or [place]?

## 2017-12-05 NOTE — Assessment & Plan Note (Signed)
Stable on HCTZ 12.5 mg once daily, continue same. BMP pending.

## 2017-12-05 NOTE — Assessment & Plan Note (Signed)
Seems intermittent. Discussed that I do not prescribe benzos for regular use. Will trial low dose hydroxyzine to use PRN as symptoms are infrequent. Discussed to notify me if symptoms become more frequent, at that point consider SSRI.

## 2017-12-05 NOTE — Progress Notes (Signed)
Subjective:    Patient ID: Samantha Hall, female    DOB: 06-Oct-1961, 56 y.o.   MRN: [id number]  HPI  [name] is a 56 year old female who presents today for follow up of hypertension and chronic back pain.  1) Essential Hypertension: Previously managed on Amlodipine 5 mg. She requested to switch to another medication as certain manufactures of Amlodipine (not hers) were recalled. She was switched to HCTZ 12.5 mg 2 weeks ago. She denies chest pain, dizziness, headaches.  BP Readings from Last 3 Encounters:  12/05/17 122/82  10/31/17 134/80  08/16/17 130/80    2) Chronic Back Pain: Since early August 2018 after a fall. Treatment includes chiropractic treatment, physical therapy, orthopedic evaluation, and MRI. MRI with L4-5 minimal broad-based disc bulge, mild bilateral facet arthropathy; L5-S1 moderate right and mild left facet arthropathy. She was discharged from physical therapy on 11/21/17 as the physical therapist didn't think there was anything else that can be done. She is compliant to her home exercises and stretching.   She's back to work four hours daily. She still experiences pain to her right hip and lower extremity with prolonged periods of walking. She'll also experiencing  intermittent numbness which has been present all along, thinks this has decreased overall. She's taking diclofenac 75 mg and cyclobenzaprine 10 mg very sparingly as needed for pain.   3) Anxiety: Has noticed a feeling of panic/anxiety/feeling tense/overwhelmed in tight places and in traffic. This has been present for the last 3-4 months. She'll use self calming techniques mostly. Symptoms occur 1-2 times monthly on average. She would like to have something "on hand" to help reduce these symptoms.  Review of Systems  Eyes: Negative for visual disturbance.  Respiratory: Negative for shortness of breath.   Cardiovascular: Negative for chest pain.  Musculoskeletal:       Chronic back pain    Neurological: Negative for dizziness and headaches.  Psychiatric/Behavioral: The patient is nervous/anxious.        See HPI       Past Medical History:  Diagnosis Date  . Elevated antinuclear antibody (ANA) level    Negative workup for Lupus.  . Hyperlipidemia   . Hypertension    Controlled  . Osteoarthritis   . Sciatica   . Vitamin D deficiency      Social History   Socioeconomic History  . Marital status: Married    Spouse name: Not on file  . Number of children: Not on file  . Years of education: Not on file  . Highest education level: Not on file  Social Needs  . Financial resource strain: Not on file  . Food insecurity - worry: Not on file  . Food insecurity - inability: Not on file  . Transportation needs - medical: Not on file  . Transportation needs - non-medical: Not on file  Occupational History  . Not on file  Tobacco Use  . Smoking status: Never Smoker  . Smokeless tobacco: Never Used  Substance and Sexual Activity  . Alcohol use: Yes    Alcohol/week: 0.0 oz    Comment: sociall  . Drug use: No  . Sexual activity: Not on file  Other Topics Concern  . Not on file  Social History Narrative   Married.   Highest level of education Bachelors.    Works as a Education officer, museum.    Past Surgical History:  Procedure Laterality Date  . ABDOMINAL HYSTERECTOMY  2002  . CHOLECYSTECTOMY  2015  . TUBAL  LIGATION  1984    No family history on file.  Allergies  Allergen Reactions  . Cefazolin Swelling and Anaphylaxis    Difficulty breathing  . Sulfa Antibiotics Rash    Current Outpatient Medications on File Prior to Visit  Medication Sig Dispense Refill  . aspirin EC 81 MG tablet Take 81 mg by mouth daily.    Marland Kitchen b complex vitamins tablet Take 1 tablet by mouth daily.    . Cholecalciferol (VITAMIN D) 2000 units CAPS Take by mouth.    . cyclobenzaprine (FLEXERIL) 10 MG tablet Take 1 tablet (10 mg total) by mouth 3 (three) times daily as needed for muscle  spasms. 30 tablet 0  . diclofenac (VOLTAREN) 75 MG EC tablet Take 1 tablet (75 mg total) by mouth 2 (two) times daily. 60 tablet 0  . hydrochlorothiazide (MICROZIDE) 12.5 MG capsule Take 1 capsule (12.5 mg total) by mouth daily. 30 capsule 0  . ibuprofen (ADVIL,MOTRIN) 200 MG tablet Take 400 mg every 6 (six) hours as needed by mouth for moderate pain.    . magnesium citrate SOLN Take 1 Bottle by mouth once.    . Multiple Vitamin (MULTIVITAMIN) tablet Take 1 tablet by mouth daily.    . TURMERIC PO Take 4 capsules by mouth daily.     No current facility-administered medications on file prior to visit.     BP 122/82   Pulse 78   Temp 98.2 F (36.8 C) (Oral)   Ht 5\' 5"  (1.651 m)   Wt 174 lb 1.9 oz (79 kg)   SpO2 97%   BMI 28.98 kg/m    Objective:   Physical Exam  Constitutional: She appears well-nourished.  Neck: Neck supple.  Cardiovascular: Normal rate and regular rhythm.  Pulmonary/Chest: Effort normal and breath sounds normal.  Skin: Skin is warm and dry.  Psychiatric: She has a normal mood and affect.          Assessment & Plan:

## 2017-12-05 NOTE — Telephone Encounter (Signed)
-----   Message from Pilar Grammes, [place] sent at 12/05/2017  9:13 AM EST ----- Regarding: Order a test? Patient forgot to ask about a need for bone density test/ she had a fall and fractured a rib. Please let patient know

## 2017-12-06 NOTE — Telephone Encounter (Signed)
Per DPR, left detail message of Kate's comments for patient to call back. 

## 2017-12-07 ENCOUNTER — Encounter: Payer: Self-pay | Admitting: Primary Care

## 2017-12-07 ENCOUNTER — Ambulatory Visit: Payer: 59 | Admitting: Primary Care

## 2017-12-07 NOTE — Telephone Encounter (Signed)
Will communicate with patient via mychart.

## 2017-12-07 NOTE — Telephone Encounter (Signed)
Pt wants to know how a bone density test is done and is it done at an imaging center or the hospital. Pt said that would determine whether she went to [place]; pt said cb after holidays would be OK.

## 2017-12-19 ENCOUNTER — Other Ambulatory Visit: Payer: Self-pay | Admitting: Primary Care

## 2017-12-19 DIAGNOSIS — I10 Essential (primary) hypertension: Secondary | ICD-10-CM

## 2017-12-20 ENCOUNTER — Emergency Department
Admission: EM | Admit: 2017-12-20 | Discharge: 2017-12-20 | Disposition: A | Discharge status: Home / Self Care | Payer: 59 | Attending: Emergency Medicine | Admitting: Emergency Medicine

## 2017-12-20 ENCOUNTER — Encounter: Payer: Self-pay | Admitting: Emergency Medicine

## 2017-12-20 ENCOUNTER — Emergency Department: Payer: 59

## 2017-12-20 ENCOUNTER — Other Ambulatory Visit: Payer: Self-pay

## 2017-12-20 DIAGNOSIS — R079 Chest pain, unspecified: Secondary | ICD-10-CM | POA: Diagnosis present

## 2017-12-20 DIAGNOSIS — Z5321 Procedure and treatment not carried out due to patient leaving prior to being seen by health care provider: Secondary | ICD-10-CM | POA: Insufficient documentation

## 2017-12-20 LAB — CBC
HCT: 40.2 % (ref 35.0–47.0)
Hemoglobin: 13.7 g/dL (ref 12.0–16.0)
MCH: 31.5 pg (ref 26.0–34.0)
MCHC: 34.2 g/dL (ref 32.0–36.0)
MCV: 92 fL (ref 80.0–100.0)
Platelets: 287 10*3/uL (ref 150–440)
RBC: 4.37 MIL/uL (ref 3.80–5.20)
RDW: 14.1 % (ref 11.5–14.5)
WBC: 5.6 10*3/uL (ref 3.6–11.0)

## 2017-12-20 LAB — BASIC METABOLIC PANEL
Anion gap: 7 (ref 5–15)
BUN: 18 mg/dL (ref 6–20)
CO2: 29 mmol/L (ref 22–32)
Calcium: 9.2 mg/dL (ref 8.9–10.3)
Chloride: 102 mmol/L (ref 101–111)
Creatinine, Ser: 1.09 mg/dL — ABNORMAL HIGH (ref 0.44–1.00)
GFR calc Af Amer: >60 mL/min (ref >60)
GFR calc non Af Amer: 55 mL/min — ABNORMAL LOW (ref >60)
Glucose, Bld: 91 mg/dL (ref 65–99)
Potassium: 3.6 mmol/L (ref 3.5–5.1)
Sodium: 138 mmol/L (ref 135–145)

## 2017-12-20 LAB — TROPONIN I: Troponin I: <0.03 ng/mL (ref <0.03)

## 2017-12-20 NOTE — ED Notes (Signed)
Pt reports leaving now due to long wait; pt denies c/o at present and vs retaken; pt encouraged to follow up with her PCP and return for any new or worsening symptoms; pt voices understanding and st will call her doctor in the morning at [facility]

## 2017-12-20 NOTE — ED Triage Notes (Signed)
Pt presents with left sided chest pain today. She states that she had similar episodes a few years ago and had cardiac cath; did not require stents. Pt states she had some sob at the time the pain hit, and the pain radiated to her neck and shoulder. States she feels hot as well. Pt alert & oriented with NAD noted.

## 2017-12-21 ENCOUNTER — Encounter: Payer: Self-pay | Admitting: Primary Care

## 2017-12-21 DIAGNOSIS — E2839 Other primary ovarian failure: Secondary | ICD-10-CM

## 2017-12-22 ENCOUNTER — Other Ambulatory Visit: Payer: Self-pay | Admitting: Primary Care

## 2017-12-22 DIAGNOSIS — M5441 Lumbago with sciatica, right side: Principal | ICD-10-CM

## 2017-12-22 DIAGNOSIS — G8929 Other chronic pain: Secondary | ICD-10-CM

## 2017-12-24 NOTE — Telephone Encounter (Signed)
Ok to refill? Electronically refill request for diclofenac (VOLTAREN) 75 MG EC tablet  Last prescribed on 11/21/2017. Last seen on 12/05/2017

## 2017-12-25 NOTE — Telephone Encounter (Signed)
Please call patient: Does she really need a refill for diclofenac or was this just automated?

## 2017-12-26 NOTE — Telephone Encounter (Signed)
Left a general message for the pt asking her to let us know if she needed the refill or not.

## 2017-12-28 NOTE — Telephone Encounter (Signed)
Message left for patient to return my call.  Send patient a message through MyChart 

## 2018-01-05 ENCOUNTER — Encounter: Payer: Self-pay | Admitting: Primary Care

## 2018-01-24 ENCOUNTER — Other Ambulatory Visit: Payer: Self-pay | Admitting: Primary Care

## 2018-01-24 DIAGNOSIS — I10 Essential (primary) hypertension: Secondary | ICD-10-CM

## 2018-02-05 ENCOUNTER — Other Ambulatory Visit: Payer: 59

## 2018-02-11 ENCOUNTER — Other Ambulatory Visit: Payer: Self-pay | Admitting: Primary Care

## 2018-02-11 DIAGNOSIS — M5441 Lumbago with sciatica, right side: Principal | ICD-10-CM

## 2018-02-11 DIAGNOSIS — G8929 Other chronic pain: Secondary | ICD-10-CM

## 2018-02-12 MED ORDER — DICLOFENAC SODIUM 75 MG PO TBEC: 75.0000 mg | DELAYED_RELEASE_TABLET | Freq: Two times a day (BID) | ORAL | 0 refills | Status: DC | PRN

## 2018-02-12 NOTE — Addendum Note (Signed)
Addended by: Pleas Koch on: 02/12/2018 06:41 PM   Modules accepted: Orders

## 2018-02-12 NOTE — Telephone Encounter (Signed)
Patient would like to speak with nurse regarding refill, call back [phone]

## 2018-02-12 NOTE — Telephone Encounter (Signed)
Patient advised.

## 2018-02-12 NOTE — Telephone Encounter (Signed)
Patient would like to know why her request for a refill of Diclofenac was not granted or if something else could be prescribed.

## 2018-02-12 NOTE — Telephone Encounter (Signed)
Please notify patient that I'm not sure why it was or if it was even "denied". I'll send refills to her pharmacy now.

## 2018-02-25 ENCOUNTER — Ambulatory Visit
Admission: RE | Admit: 2018-02-25 | Discharge: 2018-02-25 | Disposition: A | Discharge status: Home / Self Care | Payer: 59 | Source: Ambulatory Visit | Attending: Primary Care | Admitting: Primary Care

## 2018-02-25 DIAGNOSIS — E2839 Other primary ovarian failure: Secondary | ICD-10-CM | POA: Diagnosis present

## 2018-03-24 ENCOUNTER — Other Ambulatory Visit: Payer: Self-pay | Admitting: Primary Care

## 2018-03-24 DIAGNOSIS — I10 Essential (primary) hypertension: Secondary | ICD-10-CM

## 2018-04-06 ENCOUNTER — Other Ambulatory Visit: Payer: Self-pay | Admitting: Family Medicine

## 2018-04-08 NOTE — Telephone Encounter (Signed)
Electronic refill request Last office visit 12/05/17 Medication is no longer on med list

## 2018-04-29 ENCOUNTER — Ambulatory Visit (INDEPENDENT_AMBULATORY_CARE_PROVIDER_SITE_OTHER)
Admission: RE | Admit: 2018-04-29 | Discharge: 2018-04-29 | Disposition: A | Discharge status: Home / Self Care | Payer: 59 | Source: Ambulatory Visit | Attending: Family Medicine | Admitting: Family Medicine

## 2018-04-29 ENCOUNTER — Ambulatory Visit
Admission: RE | Admit: 2018-04-29 | Discharge: 2018-04-29 | Disposition: A | Discharge status: Home / Self Care | Payer: 59 | Source: Ambulatory Visit | Attending: Family Medicine | Admitting: Family Medicine

## 2018-04-29 ENCOUNTER — Encounter: Payer: Self-pay | Admitting: Family Medicine

## 2018-04-29 ENCOUNTER — Ambulatory Visit: Payer: 59 | Admitting: Family Medicine

## 2018-04-29 VITALS — BP 126/68 | HR 76 | Temp 98.4°F | Ht 65.0 in | Wt 172.8 lb

## 2018-04-29 DIAGNOSIS — M25561 Pain in right knee: Secondary | ICD-10-CM

## 2018-04-29 DIAGNOSIS — M25562 Pain in left knee: Secondary | ICD-10-CM

## 2018-04-29 DIAGNOSIS — M5441 Lumbago with sciatica, right side: Secondary | ICD-10-CM

## 2018-04-29 DIAGNOSIS — G8929 Other chronic pain: Secondary | ICD-10-CM | POA: Diagnosis not present

## 2018-04-29 MED ORDER — MELOXICAM 15 MG PO TABS: 15.0000 mg | ORAL_TABLET | Freq: Every day | ORAL | 1 refills | Status: DC | PRN

## 2018-04-29 NOTE — Patient Instructions (Signed)
Xray of knees today   Try once daily meloxicam with food instead of ibuprofen  Use ice at least twice daily for 10 minutes   You can get some sleeve type knee braces (sporting goods store) for support as needed with walking

## 2018-04-29 NOTE — Progress Notes (Signed)
Subjective:    Patient ID: Samantha Hall, female    DOB: [birth year], 57 y.o.   MRN: [id number]  HPI  Here for knee and back complaints   57 yo pt of NP [facility]    Knees hurt  Had a fall back in July- hurt back/fx rib also  Knees have hurt more and more since then  Stepped in a hole and went down on her knees   Hurts on stairs (holds rail)- feels like R knee wants to give out They hurt in bed at night  L one has a catch  Worse in the past month  Also stiffness after inactivity   Used to play basketball  Walks for exercise now (ok if not doing stairs)  She took some ibuprofen yesterday  Diclofenac is on med list- not taking it   Has not tried ice on knees     Wt Readings from Last 3 Encounters:  04/29/18 172 lb 12 oz (78.4 kg)  12/20/17 178 lb (80.7 kg)  12/05/17 174 lb 1.9 oz (79 kg)     MRI spine interp 9/18 IMPRESSION: 1. At L4-5 there is a minimal broad-based disc bulge. Mild bilateral facet arthropathy. Bilateral lateral recess narrowing. 2. At L5-S1 there is moderate right and mild left facet arthropathy. 3. No acute osseous injury of the lumbar spine. Still sees chiropractor for low back and it helps   Patient Active Problem List   Diagnosis Date Noted  . Knee pain, bilateral 04/29/2018  . Situational anxiety 12/05/2017  . Chronic low back pain 10/31/2017  . Osteoarthritis 02/02/2017  . Preventative health care 02/02/2017  . Hyperlipidemia 02/02/2017  . Essential hypertension 11/22/2015   Past Medical History:  Diagnosis Date  . Elevated antinuclear antibody (ANA) level    Negative workup for Lupus.  . Hyperlipidemia   . Hypertension    Controlled  . Osteoarthritis   . Sciatica   . Vitamin D deficiency    Past Surgical History:  Procedure Laterality Date  . ABDOMINAL HYSTERECTOMY  2002  . CHOLECYSTECTOMY  2015  . TUBAL LIGATION  1984   Social History   Tobacco Use  . Smoking status: Never Smoker  . Smokeless tobacco: Never Used    Substance Use Topics  . Alcohol use: Yes    Alcohol/week: 0.0 oz    Comment: sociall  . Drug use: No   History reviewed. No pertinent family history. Allergies  Allergen Reactions  . Cefazolin Swelling and Anaphylaxis    Difficulty breathing  . Sulfa Antibiotics Rash   Current Outpatient Medications on File Prior to Visit  Medication Sig Dispense Refill  . aspirin EC 81 MG tablet Take 81 mg by mouth daily.    Marland Kitchen b complex vitamins tablet Take 1 tablet by mouth daily.    . Cholecalciferol (VITAMIN D) 2000 units CAPS Take by mouth.    . cyclobenzaprine (FLEXERIL) 10 MG tablet Take 1 tablet (10 mg total) by mouth 3 (three) times daily as needed for muscle spasms. 30 tablet 0  . hydrochlorothiazide (MICROZIDE) 12.5 MG capsule TAKE 1 CAPSULE BY MOUTH EVERY DAY 30 capsule 5  . ibuprofen (ADVIL,MOTRIN) 200 MG tablet Take 400 mg every 6 (six) hours as needed by mouth for moderate pain.    . magnesium citrate SOLN Take 1 Bottle by mouth 3 times/day as needed-between meals & bedtime.     . Multiple Vitamin (MULTIVITAMIN) tablet Take 1 tablet by mouth daily.    . TURMERIC PO Take 4  capsules by mouth daily.    . hydrOXYzine (ATARAX/VISTARIL) 10 MG tablet Take 1 capsule by mouth up to twice daily as needed for anxiety. (Patient not taking: Reported on 04/29/2018) 30 tablet 0   No current facility-administered medications on file prior to visit.     Review of Systems  Constitutional: Negative for activity change, appetite change, fatigue, fever and unexpected weight change.  HENT: Negative for congestion, ear pain, rhinorrhea, sinus pressure and sore throat.   Eyes: Negative for pain, redness and visual disturbance.  Respiratory: Negative for cough, shortness of breath and wheezing.   Cardiovascular: Negative for chest pain and palpitations.  Gastrointestinal: Negative for abdominal pain, blood in stool, constipation and diarrhea.  Endocrine: Negative for polydipsia and polyuria.   Genitourinary: Negative for dysuria, frequency and urgency.  Musculoskeletal: Positive for arthralgias and back pain. Negative for myalgias.       Bilat knees Occ R shoulder   Skin: Negative for pallor and rash.  Allergic/Immunologic: Negative for environmental allergies.  Neurological: Negative for dizziness, syncope and headaches.  Hematological: Negative for adenopathy. Does not bruise/bleed easily.  Psychiatric/Behavioral: Negative for decreased concentration and dysphoric mood. The patient is not nervous/anxious.        Objective:   Physical Exam  Constitutional: She appears well-developed and well-nourished. No distress.  Well appearing  overwt  HENT:  Head: Normocephalic and atraumatic.  Eyes: Pupils are equal, round, and reactive to light. Conjunctivae and EOM are normal. No scleral icterus.  Neck: Normal range of motion. Neck supple.  Cardiovascular: Normal rate and regular rhythm.  Pulmonary/Chest: Effort normal and breath sounds normal. She has no wheezes. She has no rales.  Abdominal: Soft. Bowel sounds are normal. She exhibits no distension. There is no tenderness.  Musculoskeletal: She exhibits tenderness.       Right knee: She exhibits decreased range of motion and bony tenderness. She exhibits no swelling, no effusion, no ecchymosis, no deformity, normal alignment, no LCL laxity, normal patellar mobility, normal meniscus and no MCL laxity. Tenderness found. Medial joint line and patellar tendon tenderness noted.       Left knee: She exhibits decreased range of motion, effusion and bony tenderness. She exhibits no ecchymosis, no deformity, no erythema, normal alignment, no LCL laxity, normal patellar mobility, normal meniscus and no MCL laxity. Tenderness found. Medial joint line and patellar tendon tenderness noted.       Lumbar back: She exhibits decreased range of motion, tenderness and spasm. She exhibits no bony tenderness and no edema.  Pt has stiffness in both  knees when initiating gait   R knee- can flex fully with pain  slt crepitus  Some discomfort with mcmurray maneuver   L knee- limited flexion Small effusion suspect  slt crepitus  Some discomfort with mc murray maneuver    Nl ant drawer/lachman bilat  No neuro symptoms   Lymphadenopathy:    She has no cervical adenopathy.  Neurological: She is alert. She has normal strength and normal reflexes. She displays no atrophy. No cranial nerve deficit or sensory deficit. She exhibits normal muscle tone. Coordination normal.  Negative SLR  Skin: Skin is warm and dry. No rash noted. No erythema. No pallor.  Psychiatric: She has a normal mood and affect.          Assessment & Plan:   Problem List Items Addressed This Visit      Other   Chronic low back pain    Per pt -gradual improvement with chiropractic care  Relevant Medications   meloxicam (MOBIC) 15 MG tablet   Knee pain, bilateral - Primary    For a while but worse in last month with general pain and stiffness after inactivity  Worse since fall on both knees last summer  Poss small eff in L knee on exam  Xray today-pend rev  Trial of meloxicam 15 mg daily with food (instead of advil)  Ice bid if able  Knee sleeve if support helps Plan from there       Relevant Orders   DG Knee 4 Views W/Patella Left   DG Knee 4 Views W/Patella Right

## 2018-04-29 NOTE — Assessment & Plan Note (Signed)
For a while but worse in last month with general pain and stiffness after inactivity  Worse since fall on both knees last summer  Poss small eff in L knee on exam  Xray today-pend rev  Trial of meloxicam 15 mg daily with food (instead of advil)  Ice bid if able  Knee sleeve if support helps Plan from there

## 2018-04-29 NOTE — Assessment & Plan Note (Signed)
Per pt -gradual improvement with chiropractic care

## 2018-04-30 ENCOUNTER — Telehealth: Payer: Self-pay

## 2018-04-30 DIAGNOSIS — S82002A Unspecified fracture of left patella, initial encounter for closed fracture: Secondary | ICD-10-CM

## 2018-04-30 DIAGNOSIS — M25562 Pain in left knee: Secondary | ICD-10-CM

## 2018-04-30 DIAGNOSIS — M25561 Pain in right knee: Secondary | ICD-10-CM

## 2018-04-30 HISTORY — DX: Unspecified fracture of left patella, initial encounter for closed fracture: S82.002A

## 2018-04-30 NOTE — Telephone Encounter (Signed)
Noted  

## 2018-04-30 NOTE — Telephone Encounter (Signed)
The fracture looks to be lateral (outside) of patella (knee bone) of left knee - and usually these occur after direct impact  I am not sure what orthopedic will recommend for this and also knee pain in both knees  The patella fracture is likely causing some of her pain - but I also wonder if there is more reason for her knee pain (for instance cartilage or tendon related)  I will go ahead and put the orthopedic referral in  Use ice  A knee sleeve for support is good also  I cannot add more pain medicine at this time   Will also cc her pcp

## 2018-04-30 NOTE — Telephone Encounter (Signed)
I spoke with pt; pt wants to know more information about the break; where is the break and what type treatment the ortho might do. Pt also wanted Dr Glori Bickers to know the pain med, meloxicam is not helping pts pain and pt wants to discuss possibly adding another med for pain. [facility] [place]. Pt said she understands she may get cb after Dr Glori Bickers sees pts but pt does want to speak with Dr Glori Bickers.

## 2018-04-30 NOTE — Telephone Encounter (Signed)
Referral done Will route to [facility]  

## 2018-04-30 NOTE — Telephone Encounter (Signed)
-----   Message from Samantha Hall, [place] sent at 04/30/2018 10:29 AM EDT ----- Pt notified of Dr. Marliss Coots comments off of mychart. Pt does agree with referral to Ortho, she said she doesn't care either [place] or [place] is fine whichever Dr. Glori Bickers thinks is best to treat her problem

## 2018-04-30 NOTE — Telephone Encounter (Signed)
Copied from [place] [phone]. Topic: General - Other >> Apr 30, 2018  3:38 PM Valla Leaver wrote: Reason for CRM: Patient would like a call back from Dr. Glori Bickers to discuss her x-ray results from yesterday. The nurse called her already but she would like to ask Dr. Glori Bickers some questions.

## 2018-05-01 ENCOUNTER — Encounter (INDEPENDENT_AMBULATORY_CARE_PROVIDER_SITE_OTHER): Payer: Self-pay

## 2018-05-01 NOTE — Telephone Encounter (Signed)
Called patient and had to [facility].Called patients husband and he said she already had an Ortho Appt today at 2:30pm. Left my chart message for the patient so we can send over our notes and xray reports to the Ortho she made appt with.

## 2018-05-20 ENCOUNTER — Encounter: Payer: Self-pay | Admitting: Primary Care

## 2018-05-20 DIAGNOSIS — Z1159 Encounter for screening for other viral diseases: Secondary | ICD-10-CM

## 2018-05-21 ENCOUNTER — Other Ambulatory Visit: Payer: Self-pay | Admitting: Primary Care

## 2018-05-21 DIAGNOSIS — I10 Essential (primary) hypertension: Secondary | ICD-10-CM

## 2018-06-10 ENCOUNTER — Ambulatory Visit: Payer: 59 | Admitting: Primary Care

## 2018-06-10 ENCOUNTER — Ambulatory Visit (INDEPENDENT_AMBULATORY_CARE_PROVIDER_SITE_OTHER)
Admission: RE | Admit: 2018-06-10 | Discharge: 2018-06-10 | Disposition: A | Discharge status: Home / Self Care | Payer: 59 | Source: Ambulatory Visit | Attending: Primary Care | Admitting: Primary Care

## 2018-06-10 ENCOUNTER — Encounter: Payer: Self-pay | Admitting: Primary Care

## 2018-06-10 VITALS — BP 116/66 | HR 62 | Temp 98.1°F | Ht 65.0 in | Wt 175.2 lb

## 2018-06-10 DIAGNOSIS — R945 Abnormal results of liver function studies: Secondary | ICD-10-CM | POA: Diagnosis not present

## 2018-06-10 DIAGNOSIS — M25539 Pain in unspecified wrist: Secondary | ICD-10-CM

## 2018-06-10 DIAGNOSIS — Z1159 Encounter for screening for other viral diseases: Secondary | ICD-10-CM

## 2018-06-10 DIAGNOSIS — I10 Essential (primary) hypertension: Secondary | ICD-10-CM

## 2018-06-10 DIAGNOSIS — R7303 Prediabetes: Secondary | ICD-10-CM | POA: Insufficient documentation

## 2018-06-10 DIAGNOSIS — G8929 Other chronic pain: Secondary | ICD-10-CM | POA: Insufficient documentation

## 2018-06-10 DIAGNOSIS — M25511 Pain in right shoulder: Secondary | ICD-10-CM

## 2018-06-10 DIAGNOSIS — R7989 Other specified abnormal findings of blood chemistry: Secondary | ICD-10-CM

## 2018-06-10 DIAGNOSIS — M25519 Pain in unspecified shoulder: Secondary | ICD-10-CM

## 2018-06-10 LAB — COMPREHENSIVE METABOLIC PANEL
ALT: 35 U/L (ref 0–35)
AST: 20 U/L (ref 0–37)
Albumin: 4.3 g/dL (ref 3.5–5.2)
Alkaline Phosphatase: 94 U/L (ref 39–117)
BUN: 16 mg/dL (ref 6–23)
CO2: 31 mEq/L (ref 19–32)
Calcium: 9.7 mg/dL (ref 8.4–10.5)
Chloride: 103 mEq/L (ref 96–112)
Creatinine, Ser: 1 mg/dL (ref 0.40–1.20)
GFR: 73.23 mL/min (ref >60.00)
Glucose, Bld: 107 mg/dL — ABNORMAL HIGH (ref 70–99)
Potassium: 4.4 mEq/L (ref 3.5–5.1)
Sodium: 141 mEq/L (ref 135–145)
Total Bilirubin: 0.7 mg/dL (ref 0.2–1.2)
Total Protein: 7.4 g/dL (ref 6.0–8.3)

## 2018-06-10 LAB — HEMOGLOBIN A1C: Hgb A1c MFr Bld: 6 % (ref 4.6–6.5)

## 2018-06-10 MED ORDER — AMLODIPINE BESYLATE 5 MG PO TABS: 5.0000 mg | ORAL_TABLET | Freq: Every day | ORAL | 1 refills | Status: DC

## 2018-06-10 NOTE — Assessment & Plan Note (Signed)
Noted on employee health screening from May that had improved overall. Repeat A1C pending today.

## 2018-06-10 NOTE — Patient Instructions (Signed)
Stop by the lab and xray prior to leaving today. I will notify you of your results once received.   Try taking Claritin/Allegra/Zyrtec prior to sun exposure to limit itching.  It was a pleasure to see you today!   Shoulder Exercises Ask your health care provider which exercises are safe for you. Do exercises exactly as told by your health care provider and adjust them as directed. It is normal to feel mild stretching, pulling, tightness, or discomfort as you do these exercises, but you should stop right away if you feel sudden pain or your pain gets worse.Do not begin these exercises until told by your health care provider. RANGE OF MOTION EXERCISES These exercises warm up your muscles and joints and improve the movement and flexibility of your shoulder. These exercises also help to relieve pain, numbness, and tingling. These exercises involve stretching your injured shoulder directly. Exercise A: Pendulum  1. Stand near a wall or a surface that you can hold onto for balance. 2. Bend at the waist and let your left / right arm hang straight down. Use your other arm to support you. Keep your back straight and do not lock your knees. 3. Relax your left / right arm and shoulder muscles, and move your hips and your trunk so your left / right arm swings freely. Your arm should swing because of the motion of your body, not because you are using your arm or shoulder muscles. 4. Keep moving your body so your arm swings in the following directions, as told by your health care provider: ? Side to side. ? Forward and backward. ? In clockwise and counterclockwise circles. 5. Continue each motion for __________ seconds, or for as long as told by your health care provider. 6. Slowly return to the starting position. Repeat __________ times. Complete this exercise __________ times a day. Exercise B:Flexion, Standing  1. Stand and hold a broomstick, a cane, or a similar object. Place your hands a little  more than shoulder-width apart on the object. Your left / right hand should be palm-up, and your other hand should be palm-down. 2. Keep your elbow straight and keep your shoulder muscles relaxed. Push the stick down with your healthy arm to raise your left / right arm in front of your body, and then over your head until you feel a stretch in your shoulder. ? Avoid shrugging your shoulder while you raise your arm. Keep your shoulder blade tucked down toward the middle of your back. 3. Hold for __________ seconds. 4. Slowly return to the starting position. Repeat __________ times. Complete this exercise __________ times a day. Exercise C: Abduction, Standing 1. Stand and hold a broomstick, a cane, or a similar object. Place your hands a little more than shoulder-width apart on the object. Your left / right hand should be palm-up, and your other hand should be palm-down. 2. While keeping your elbow straight and your shoulder muscles relaxed, push the stick across your body toward your left / right side. Raise your left / right arm to the side of your body and then over your head until you feel a stretch in your shoulder. ? Do not raise your arm above shoulder height, unless your health care provider tells you to do that. ? Avoid shrugging your shoulder while you raise your arm. Keep your shoulder blade tucked down toward the middle of your back. 3. Hold for __________ seconds. 4. Slowly return to the starting position. Repeat __________ times. Complete this exercise  __________ times a day. Exercise D:Internal Rotation  1. Place your left / right hand behind your back, palm-up. 2. Use your other hand to dangle an exercise band, a towel, or a similar object over your shoulder. Grasp the band with your left / right hand so you are holding onto both ends. 3. Gently pull up on the band until you feel a stretch in the front of your left / right shoulder. ? Avoid shrugging your shoulder while you raise  your arm. Keep your shoulder blade tucked down toward the middle of your back. 4. Hold for __________ seconds. 5. Release the stretch by letting go of the band and lowering your hands. Repeat __________ times. Complete this exercise __________ times a day. STRETCHING EXERCISES These exercises warm up your muscles and joints and improve the movement and flexibility of your shoulder. These exercises also help to relieve pain, numbness, and tingling. These exercises are done using your healthy shoulder to help stretch the muscles of your injured shoulder. Exercise E: Warehouse manager (External Rotation and Abduction)  1. Stand in a doorway with one of your feet slightly in front of the other. This is called a staggered stance. If you cannot reach your forearms to the door frame, stand facing a corner of a room. 2. Choose one of the following positions as told by your health care provider: ? Place your hands and forearms on the door frame above your head. ? Place your hands and forearms on the door frame at the height of your head. ? Place your hands on the door frame at the height of your elbows. 3. Slowly move your weight onto your front foot until you feel a stretch across your chest and in the front of your shoulders. Keep your head and chest upright and keep your abdominal muscles tight. 4. Hold for __________ seconds. 5. To release the stretch, shift your weight to your back foot. Repeat __________ times. Complete this stretch __________ times a day. Exercise F:Extension, Standing 1. Stand and hold a broomstick, a cane, or a similar object behind your back. ? Your hands should be a little wider than shoulder-width apart. ? Your palms should face away from your back. 2. Keeping your elbows straight and keeping your shoulder muscles relaxed, move the stick away from your body until you feel a stretch in your shoulder. ? Avoid shrugging your shoulders while you move the stick. Keep your shoulder  blade tucked down toward the middle of your back. 3. Hold for __________ seconds. 4. Slowly return to the starting position. Repeat __________ times. Complete this exercise __________ times a day. STRENGTHENING EXERCISES These exercises build strength and endurance in your shoulder. Endurance is the ability to use your muscles for a long time, even after they get tired. Exercise G:External Rotation  1. Sit in a stable chair without armrests. 2. Secure an exercise band at elbow height on your left / right side. 3. Place a soft object, such as a folded towel or a small pillow, between your left / right upper arm and your body to move your elbow a few inches away (about 10 cm) from your side. 4. Hold the end of the band so it is tight and there is no slack. 5. Keeping your elbow pressed against the soft object, move your left / right forearm out, away from your abdomen. Keep your body steady so only your forearm moves. 6. Hold for __________ seconds. 7. Slowly return to the starting position. Repeat  __________ times. Complete this exercise __________ times a day. Exercise H:Shoulder Abduction  1. Sit in a stable chair without armrests, or stand. 2. Hold a __________ weight in your left / right hand, or hold an exercise band with both hands. 3. Start with your arms straight down and your left / right palm facing in, toward your body. 4. Slowly lift your left / right hand out to your side. Do not lift your hand above shoulder height unless your health care provider tells you that this is safe. ? Keep your arms straight. ? Avoid shrugging your shoulder while you do this movement. Keep your shoulder blade tucked down toward the middle of your back. 5. Hold for __________ seconds. 6. Slowly lower your arm, and return to the starting position. Repeat __________ times. Complete this exercise __________ times a day. Exercise I:Shoulder Extension 1. Sit in a stable chair without armrests, or  stand. 2. Secure an exercise band to a stable object in front of you where it is at shoulder height. 3. Hold one end of the exercise band in each hand. Your palms should face each other. 4. Straighten your elbows and lift your hands up to shoulder height. 5. Step back, away from the secured end of the exercise band, until the band is tight and there is no slack. 6. Squeeze your shoulder blades together as you pull your hands down to the sides of your thighs. Stop when your hands are straight down by your sides. Do not let your hands go behind your body. 7. Hold for __________ seconds. 8. Slowly return to the starting position. Repeat __________ times. Complete this exercise __________ times a day. Exercise J:Standing Shoulder Row 1. Sit in a stable chair without armrests, or stand. 2. Secure an exercise band to a stable object in front of you so it is at waist height. 3. Hold one end of the exercise band in each hand. Your palms should be in a thumbs-up position. 4. Bend each of your elbows to an "L" shape (about 90 degrees) and keep your upper arms at your sides. 5. Step back until the band is tight and there is no slack. 6. Slowly pull your elbows back behind you. 7. Hold for __________ seconds. 8. Slowly return to the starting position. Repeat __________ times. Complete this exercise __________ times a day. Exercise K:Shoulder Press-Ups  1. Sit in a stable chair that has armrests. Sit upright, with your feet flat on the floor. 2. Put your hands on the armrests so your elbows are bent and your fingers are pointing forward. Your hands should be about even with the sides of your body. 3. Push down on the armrests and use your arms to lift yourself off of the chair. Straighten your elbows and lift yourself up as much as you comfortably can. ? Move your shoulder blades down, and avoid letting your shoulders move up toward your ears. ? Keep your feet on the ground. As you get stronger, your  feet should support less of your body weight as you lift yourself up. 4. Hold for __________ seconds. 5. Slowly lower yourself back into the chair. Repeat __________ times. Complete this exercise __________ times a day. Exercise L: Wall Push-Ups  1. Stand so you are facing a stable wall. Your feet should be about one arm-length away from the wall. 2. Lean forward and place your palms on the wall at shoulder height. 3. Keep your feet flat on the floor as you bend your  elbows and lean forward toward the wall. 4. Hold for __________ seconds. 5. Straighten your elbows to push yourself back to the starting position. Repeat __________ times. Complete this exercise __________ times a day. This information is not intended to replace advice given to you by your health care provider. Make sure you discuss any questions you have with your health care provider. Document Released: 10/18/2005 Document Revised: 08/28/2016 Document Reviewed: 08/15/2015 Elsevier Interactive Patient Education  2018 [employer].

## 2018-06-10 NOTE — Assessment & Plan Note (Signed)
Chronic since falling one year ago.  Exam today with mild decrease in ROM.  Check plain films today. Discussed conservative treatment and ortho vs sports medicine referral.

## 2018-06-10 NOTE — Progress Notes (Signed)
Subjective:    Patient ID: Samantha Hall, female    DOB: [birth year], 57 y.o.   MRN: [id number]  HPI  Samantha Hall is a 57 year old female with a history of osteoarthritis, chronic knee pain, chronic low back pain who presents today with a chief complaint of shoulder pain and other concerns.  Her pain is located to the right upper anterior shoulder. She experiences pain when laying on the right side and with some abduction. She denies numbness/tingling down her right upper extremity. Her pain has been present for the past one year since a fall.   She is currently following with orthopedics and physical therapy for her knee pain. She's also seeing chiropractor for her chronic back pain. During her fall one year ago she landed on her outstretched hands and is wanting to ensure no fracture to her wrists. She does have intermittent pain to bilateral wrists, right worse than left.  She's also needing repeat liver enzymes as these were found to be elevated during an employee screening test 1-2 months ago. She was also noted to have prediabetes.   Review of Systems  Musculoskeletal: Positive for arthralgias.  Skin: Negative for color change.  Neurological: Negative for numbness.       Past Medical History:  Diagnosis Date  . Elevated antinuclear antibody (ANA) level    Negative workup for Lupus.  . Hyperlipidemia   . Hypertension    Controlled  . Osteoarthritis   . Sciatica   . Vitamin D deficiency      Social History   Socioeconomic History  . Marital status: Married    Spouse name: Not on file  . Number of children: Not on file  . Years of education: Not on file  . Highest education level: Not on file  Occupational History  . Not on file  Social Needs  . Financial resource strain: Not on file  . Food insecurity:    Worry: Not on file    Inability: Not on file  . Transportation needs:    Medical: Not on file    Non-medical: Not on file  Tobacco Use  . Smoking status:  Never Smoker  . Smokeless tobacco: Never Used  Substance and Sexual Activity  . Alcohol use: Yes    Alcohol/week: 0.0 oz    Comment: sociall  . Drug use: No  . Sexual activity: Not on file  Lifestyle  . Physical activity:    Days per week: Not on file    Minutes per session: Not on file  . Stress: Not on file  Relationships  . Social connections:    Talks on phone: Not on file    Gets together: Not on file    Attends religious service: Not on file    Active member of club or organization: Not on file    Attends meetings of clubs or organizations: Not on file    Relationship status: Not on file  . Intimate partner violence:    Fear of current or ex partner: Not on file    Emotionally abused: Not on file    Physically abused: Not on file    Forced sexual activity: Not on file  Other Topics Concern  . Not on file  Social History Narrative   Married.   Highest level of education Bachelors.    Works as a Education officer, museum.    Past Surgical History:  Procedure Laterality Date  . ABDOMINAL HYSTERECTOMY  2002  . CHOLECYSTECTOMY  2015  .  TUBAL LIGATION  1984    No family history on file.  Allergies  Allergen Reactions  . Cefazolin Swelling and Anaphylaxis    Difficulty breathing  . Sulfa Antibiotics Rash    Current Outpatient Medications on File Prior to Visit  Medication Sig Dispense Refill  . amLODipine (NORVASC) 5 MG tablet Take 5 mg by mouth daily.  3  . aspirin EC 81 MG tablet Take 81 mg by mouth daily.    Marland Kitchen b complex vitamins tablet Take 1 tablet by mouth daily.    . Cholecalciferol (VITAMIN D) 2000 units CAPS Take by mouth.    Marland Kitchen ibuprofen (ADVIL,MOTRIN) 200 MG tablet Take 400 mg every 6 (six) hours as needed by mouth for moderate pain.    . magnesium citrate SOLN Take 1 Bottle by mouth 3 times/day as needed-between meals & bedtime.     . Multiple Vitamin (MULTIVITAMIN) tablet Take 1 tablet by mouth daily.    . TURMERIC PO Take 4 capsules by mouth daily.    .  cyclobenzaprine (FLEXERIL) 10 MG tablet Take 1 tablet (10 mg total) by mouth 3 (three) times daily as needed for muscle spasms. (Patient not taking: Reported on 06/10/2018) 30 tablet 0  . hydrOXYzine (ATARAX/VISTARIL) 10 MG tablet Take 1 capsule by mouth up to twice daily as needed for anxiety. (Patient not taking: Reported on 06/10/2018) 30 tablet 0   No current facility-administered medications on file prior to visit.     BP 116/66   Pulse 62   Temp 98.1 F (36.7 C) (Oral)   Ht 5\' 5"  (1.651 m)   Wt 175 lb 4 oz (79.5 kg)   SpO2 99%   BMI 29.16 kg/m    Objective:   Physical Exam  Constitutional: She appears well-nourished.  Cardiovascular: Normal rate.  Respiratory: Effort normal.  Musculoskeletal:       Right shoulder: She exhibits decreased range of motion, tenderness and pain. She exhibits no bony tenderness and no swelling.       Right wrist: She exhibits normal range of motion and no tenderness.       Left wrist: She exhibits normal range of motion and no tenderness.  Decrease in ROM with lateral abduction, posterior abduction. Good strength overall bilaterally. Negative empty can test.            Assessment & Plan:

## 2018-06-10 NOTE — Assessment & Plan Note (Signed)
Noted during employee health screening in May. [place] of liver obtained in 2017 with evidence of mild fatty liver. Repeat LFT's today.

## 2018-06-10 NOTE — Assessment & Plan Note (Signed)
Switched back to Amlodipine herself, stopped HCTZ. Resume Amlodipine 5 mg, refills sent to pharmacy. BP stable.

## 2018-06-11 LAB — HEPATITIS C ANTIBODY
Hepatitis C Ab: NONREACTIVE
SIGNAL TO CUT-OFF: 0.01 (ref <1.00)

## 2018-06-12 ENCOUNTER — Encounter: Payer: Self-pay | Admitting: Primary Care

## 2018-06-24 ENCOUNTER — Encounter: Payer: Self-pay | Admitting: Family Medicine

## 2018-07-08 ENCOUNTER — Encounter: Payer: Self-pay | Admitting: Primary Care

## 2018-07-09 ENCOUNTER — Telehealth: Payer: Self-pay | Admitting: Primary Care

## 2018-07-09 NOTE — Telephone Encounter (Signed)
Pt dropped off FMLA paperwork  She stated it was same as last year for intermittent.  She wanted to know if she could increase the flare up from 1-2 times monthly lasting 8 hours to 3-4 times monthly lasting up to 8 hours.   Pt is aware she may have to come in to talk to you about increasing days off  Paperwork in [employer] in box

## 2018-07-10 NOTE — Telephone Encounter (Signed)
Completed and placed on [facility] desk. 

## 2018-07-11 NOTE — Telephone Encounter (Signed)
Paperwork faxed Copy for pt Copy for scan  Pt aware 

## 2018-08-18 ENCOUNTER — Other Ambulatory Visit: Payer: Self-pay | Admitting: Primary Care

## 2018-08-18 DIAGNOSIS — M5441 Lumbago with sciatica, right side: Principal | ICD-10-CM

## 2018-08-18 DIAGNOSIS — G8929 Other chronic pain: Secondary | ICD-10-CM

## 2018-08-21 NOTE — Telephone Encounter (Signed)
It looks like she was provided with Meloxicam during her visit with Dr. Glori Bickers in May 2019, this is when her diclofenac was discontinued. Is she taking meloxicam? She can't take both meloxicam and diclofenac.  Will you also ask for the reason for the diclofenac refill request?

## 2018-08-21 NOTE — Telephone Encounter (Signed)
Last prescribed on 02/12/2018 Last office visit on 06/10/2018  Medcation is no longer on the current medication list

## 2018-08-22 NOTE — Telephone Encounter (Signed)
Message left for patient to return my call.  

## 2018-08-23 NOTE — Telephone Encounter (Signed)
Noted, I'll send her a my chart message regarding her liver enzymes. Refill for diclofenac sent to pharmacy.

## 2018-08-23 NOTE — Telephone Encounter (Signed)
Spoken and notified patient of Samantha Millers comments. Patient stated the diclofenac works better than meloxicam. Patient is not taking meloxicam any more. Please refill.  Also patient wanted to know if Samantha Hall received report regarding her lab results from her work clinic, Princella Ion, NP. Patient's liver enzymes were high.

## 2018-09-12 ENCOUNTER — Other Ambulatory Visit: Payer: Self-pay | Admitting: Primary Care

## 2018-09-12 DIAGNOSIS — I10 Essential (primary) hypertension: Secondary | ICD-10-CM

## 2018-12-03 ENCOUNTER — Other Ambulatory Visit: Payer: Self-pay | Admitting: Primary Care

## 2018-12-03 DIAGNOSIS — F418 Other specified anxiety disorders: Secondary | ICD-10-CM

## 2018-12-04 NOTE — Telephone Encounter (Signed)
Noted, refill sent to pharmacy. 

## 2018-12-04 NOTE — Telephone Encounter (Signed)
Last prescribed on 12/05/2017  Last office visit on 06/10/2018  Note from pharmacy: [place].

## 2018-12-27 ENCOUNTER — Encounter: Payer: Self-pay | Admitting: Primary Care

## 2018-12-27 ENCOUNTER — Other Ambulatory Visit (INDEPENDENT_AMBULATORY_CARE_PROVIDER_SITE_OTHER): Payer: Managed Care, Other (non HMO)

## 2018-12-27 ENCOUNTER — Ambulatory Visit: Payer: Managed Care, Other (non HMO) | Admitting: Primary Care

## 2018-12-27 VITALS — BP 120/78 | HR 67 | Temp 98.0°F | Ht 65.0 in | Wt 172.5 lb

## 2018-12-27 DIAGNOSIS — G8929 Other chronic pain: Secondary | ICD-10-CM

## 2018-12-27 DIAGNOSIS — R1010 Upper abdominal pain, unspecified: Secondary | ICD-10-CM | POA: Insufficient documentation

## 2018-12-27 DIAGNOSIS — M2550 Pain in unspecified joint: Secondary | ICD-10-CM | POA: Diagnosis not present

## 2018-12-27 DIAGNOSIS — R945 Abnormal results of liver function studies: Secondary | ICD-10-CM | POA: Diagnosis not present

## 2018-12-27 DIAGNOSIS — M79641 Pain in right hand: Secondary | ICD-10-CM

## 2018-12-27 DIAGNOSIS — M79642 Pain in left hand: Secondary | ICD-10-CM

## 2018-12-27 DIAGNOSIS — M5441 Lumbago with sciatica, right side: Secondary | ICD-10-CM | POA: Diagnosis not present

## 2018-12-27 DIAGNOSIS — R7989 Other specified abnormal findings of blood chemistry: Secondary | ICD-10-CM

## 2018-12-27 HISTORY — DX: Upper abdominal pain, unspecified: R10.10

## 2018-12-27 LAB — HEPATIC FUNCTION PANEL
ALT: 36 U/L — ABNORMAL HIGH (ref 0–35)
AST: 19 U/L (ref 0–37)
Albumin: 4.1 g/dL (ref 3.5–5.2)
Alkaline Phosphatase: 89 U/L (ref 39–117)
Bilirubin, Direct: 0.1 mg/dL (ref 0.0–0.3)
Total Bilirubin: 0.7 mg/dL (ref 0.2–1.2)
Total Protein: 7.1 g/dL (ref 6.0–8.3)

## 2018-12-27 LAB — CBC
HCT: 41 % (ref 36.0–46.0)
Hemoglobin: 13.8 g/dL (ref 12.0–15.0)
MCHC: 33.6 g/dL (ref 30.0–36.0)
MCV: 91.4 fl (ref 78.0–100.0)
Platelets: 286 10*3/uL (ref 150.0–400.0)
RBC: 4.49 Mil/uL (ref 3.87–5.11)
RDW: 13.3 % (ref 11.5–15.5)
WBC: 4.7 10*3/uL (ref 4.0–10.5)

## 2018-12-27 LAB — URIC ACID: Uric Acid, Serum: 5.5 mg/dL (ref 2.4–7.0)

## 2018-12-27 LAB — LIPASE: Lipase: 22 U/L (ref 11.0–59.0)

## 2018-12-27 MED ORDER — DICLOFENAC SODIUM 75 MG PO TBEC: 75.0000 mg | DELAYED_RELEASE_TABLET | Freq: Two times a day (BID) | ORAL | 0 refills | Status: DC | PRN

## 2018-12-27 NOTE — Patient Instructions (Signed)
Stop by the lab prior to leaving today. I will notify you of your results once received.   You will be contacted regarding your abdominal ultrasound.  Please let us know if you have not been contacted within one week.   It was a pleasure to see you today!

## 2018-12-27 NOTE — Assessment & Plan Note (Signed)
Repeat labs pending today.

## 2018-12-27 NOTE — Progress Notes (Signed)
Subjective:    Patient ID: Samantha Hall, female    DOB: [birth year], 58 y.o.   MRN: [id number]  HPI  Samantha Hall is a 58 year old female with a history of osteoarthritis, hypertension, elevated LFT's, prediabetes who presents today with multiple complaints.  1) Abdominal Pain: Her pain is located to the epigastric region, only occurring with palpation. She also endorses "swelling" to that region. She noticed discomfort with palpation, discomfort to the area when wearing her bra, or with any other pressure to her epigastric region. She doesn't have pain without palpitation.   She denies pain with eating, nausea, vomiting, diarrhea, esophageal burning, fevers.  She thinks she was once told that she has hiatal hernia.  BP Readings from Last 3 Encounters:  12/27/18 120/78  06/10/18 116/66  04/29/18 126/68   2) Joint Swelling: Located to the right second MCP joint that she first noticed 1 week ago. She has a history of this in the past, hands do feel achy at times.  She's been putting black seed oil with some improvement. 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 Overall the swelling has reduced. She denies erythema, trauma/injury.  She is not taking anything orally for her symptoms.  Review of Systems  Constitutional: Negative for fever.  Gastrointestinal: Negative for constipation, diarrhea, nausea and vomiting.  Musculoskeletal: Positive for arthralgias and joint swelling.       Right second digit finger pain       Past Medical History:  Diagnosis Date  . Elevated antinuclear antibody (ANA) level    Negative workup for Lupus.  . Hyperlipidemia   . Hypertension    Controlled  . Osteoarthritis   . Sciatica   . Vitamin D deficiency      Social History   Socioeconomic History  . Marital status: Married    Spouse name: Not on file  . Number of children: Not on file  . Years of education: Not on file  . Highest education level: Not on file  Occupational History    . Not on file  Social Needs  . Financial resource strain: Not on file  . Food insecurity:    Worry: Not on file    Inability: Not on file  . Transportation needs:    Medical: Not on file    Non-medical: Not on file  Tobacco Use  . Smoking status: Never Smoker  . Smokeless tobacco: Never Used  Substance and Sexual Activity  . Alcohol use: Yes    Alcohol/week: 0.0 standard drinks    Comment: sociall  . Drug use: No  . Sexual activity: Not on file  Lifestyle  . Physical activity:    Days per week: Not on file    Minutes per session: Not on file  . Stress: Not on file  Relationships  . Social connections:    Talks on phone: Not on file    Gets together: Not on file    Attends religious service: Not on file    Active member of club or organization: Not on file    Attends meetings of clubs or organizations: Not on file    Relationship status: Not on file  . Intimate partner violence:    Fear of current or ex partner: Not on file    Emotionally abused: Not on file    Physically abused: Not on file    Forced sexual activity: Not on file  Other Topics Concern  . Not on file  Social  History Narrative   Married.   Highest level of education Bachelors.    Works as a Education officer, [employer].    Past Surgical History:  Procedure Laterality Date  . ABDOMINAL HYSTERECTOMY  2002  . CHOLECYSTECTOMY  2015  . TUBAL LIGATION  1984    No family history on file.  Allergies  Allergen Reactions  . Cefazolin Swelling and Anaphylaxis    Difficulty breathing  . Sulfa Antibiotics Rash    Current Outpatient Medications on File Prior to Visit  Medication Sig Dispense Refill  . amLODipine (NORVASC) 5 MG tablet Take 1 tablet (5 mg total) by mouth daily. 90 tablet 1  . aspirin EC 81 MG tablet Take 81 mg by mouth daily.    Marland Kitchen b complex vitamins tablet Take 1 tablet by mouth daily.    . Cholecalciferol (VITAMIN D) 2000 units CAPS Take by mouth.    . hydrOXYzine (ATARAX/VISTARIL) 10 MG tablet  TAKE 1 CAPSULE BY MOUTH UP TO TWICE DAILY AS NEEDED FOR ANXIETY. 30 tablet 0  . ibuprofen (ADVIL,MOTRIN) 200 MG tablet Take 400 mg every 6 (six) hours as needed by mouth for moderate pain.    . Multiple Vitamin (MULTIVITAMIN) tablet Take 1 tablet by mouth daily.    . TURMERIC PO Take 4 capsules by mouth daily.    . cyclobenzaprine (FLEXERIL) 10 MG tablet Take 1 tablet (10 mg total) by mouth 3 (three) times daily as needed for muscle spasms. (Patient not taking: Reported on 06/10/2018) 30 tablet 0   No current facility-administered medications on file prior to visit.     BP 120/78   Pulse 67   Temp 98 F (36.7 C) (Oral)   Ht 5\' 5"  (1.651 m)   Wt 172 lb 8 oz (78.2 kg)   SpO2 97%   BMI 28.71 kg/m    Objective:   Physical Exam  Constitutional: She appears well-nourished.  Cardiovascular: Normal rate and regular rhythm.  Respiratory: Effort normal and breath sounds normal.  GI: Soft. Bowel sounds are normal. There is abdominal tenderness in the epigastric area. There is no guarding.  Musculoskeletal:     Right hand: She exhibits swelling. She exhibits normal range of motion, no tenderness and no bony tenderness.       Hands:     Comments: Slight swelling to right MCP joint of second digit.  No erythema, decrease in range of motion, tenderness.  Skin: Skin is warm and dry. No erythema.           Assessment & Plan:

## 2018-12-27 NOTE — Assessment & Plan Note (Signed)
Only occurs with repetitive palpation. Does not seem representative for gallbladder or gastric cause. Differentials include discomfort from hiatal hernia, GERD, pancreatic involvement, other hernia. Check labs today including LFTs, lipase, CBC. Abdominal ultrasound pending.

## 2018-12-27 NOTE — Assessment & Plan Note (Signed)
Intermittent.  Most pain to right second MCP joint. Exam today mostly consistent for arthritis.  No obvious gout. Recommended rest, ice, refill for diclofenac provided. She will update if symptoms persist.

## 2019-01-06 ENCOUNTER — Ambulatory Visit
Admission: RE | Admit: 2019-01-06 | Discharge: 2019-01-06 | Disposition: A | Discharge status: Home / Self Care | Payer: Managed Care, Other (non HMO) | Source: Ambulatory Visit | Attending: Primary Care | Admitting: Primary Care

## 2019-01-06 DIAGNOSIS — R1010 Upper abdominal pain, unspecified: Secondary | ICD-10-CM

## 2019-01-31 LAB — HM MAMMOGRAPHY

## 2019-01-31 IMAGING — US US ABDOMEN LIMITED
1 series · 14 of 25 positions shown · non-contrast
Comparison: 01/05/2006

CLINICAL DATA: Right upper quadrant pain, remote cholecystectomy

EXAM:
ULTRASOUND ABDOMEN LIMITED RIGHT UPPER QUADRANT

[Series 1: us abdomen limited · 0.23mm/px · 14 of 26 slices shown]
[im 1/26]
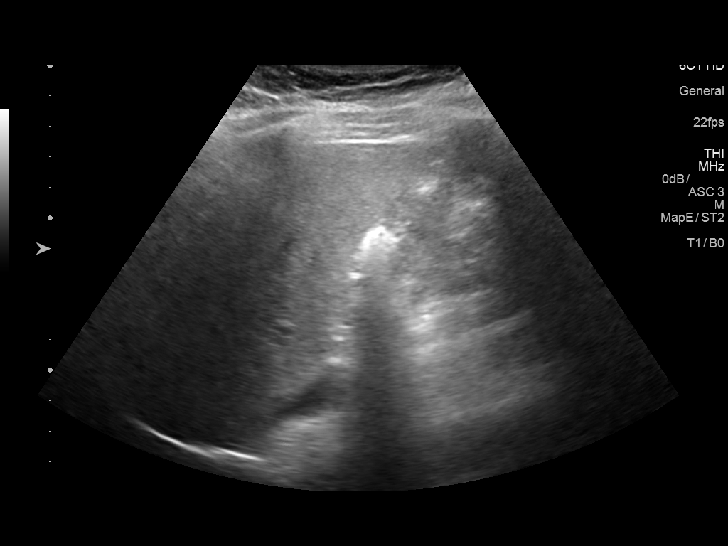
[im 3/26]
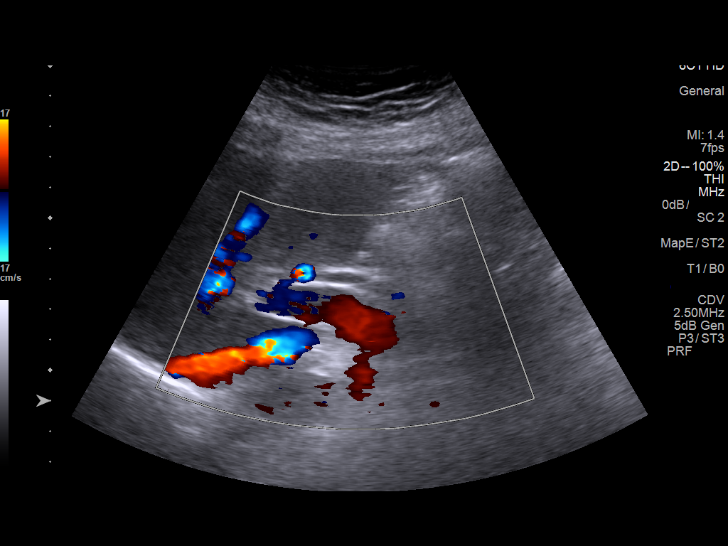
[im 5/26]
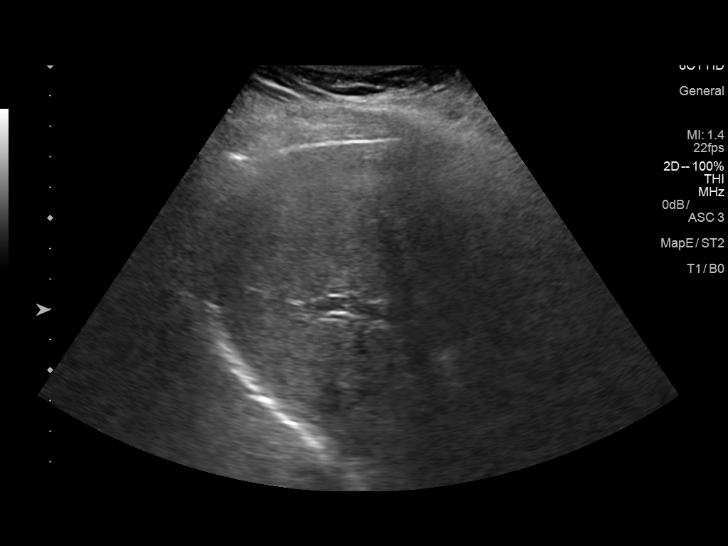
[im 7/26]
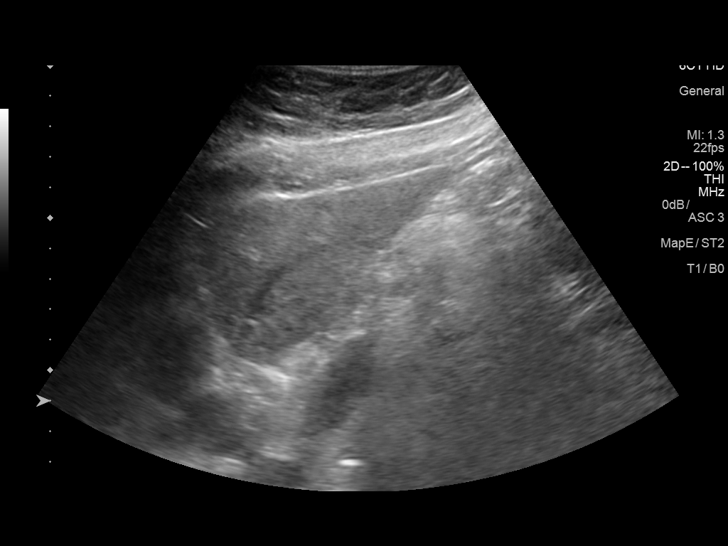
[im 9/26]
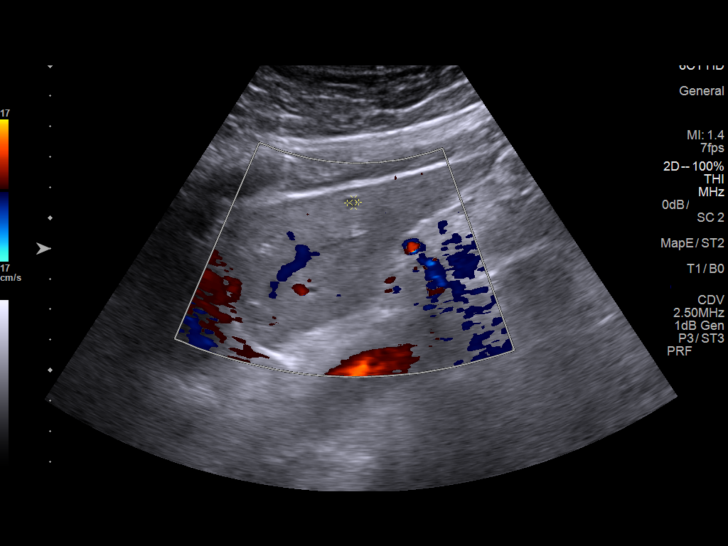
[im 10/26]
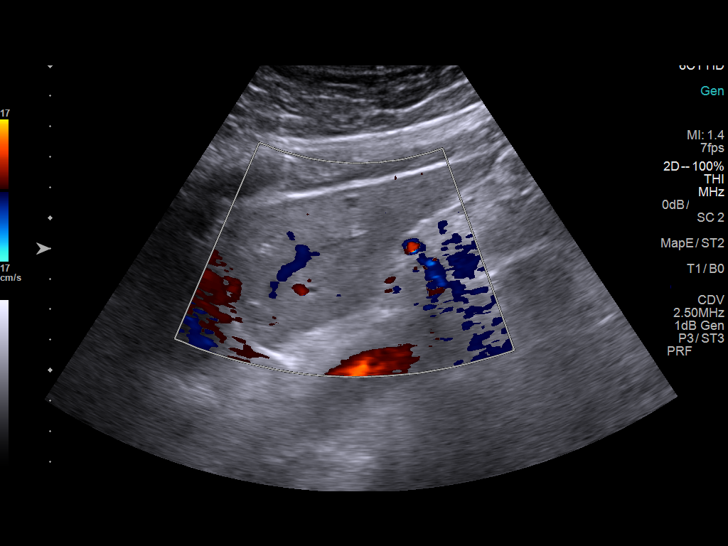
[im 12/26]
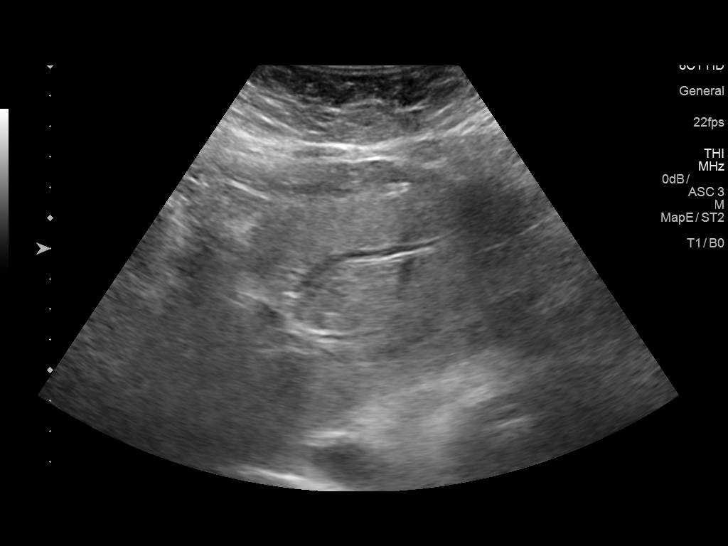
[im 14/26]
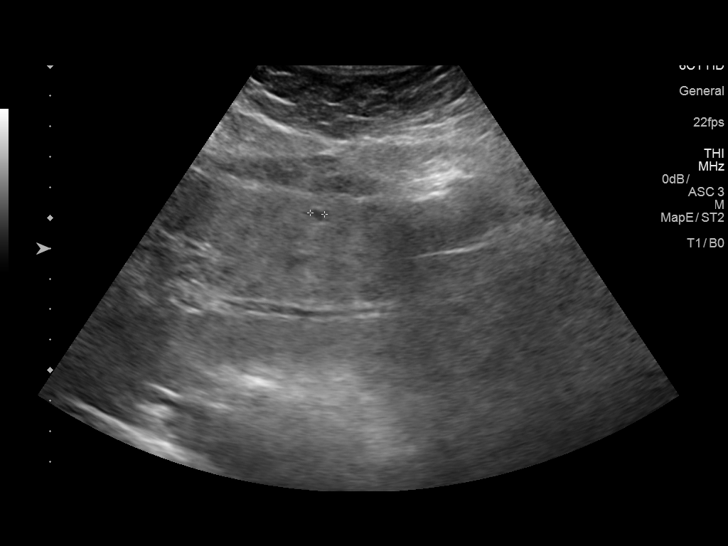
[im 16/26]
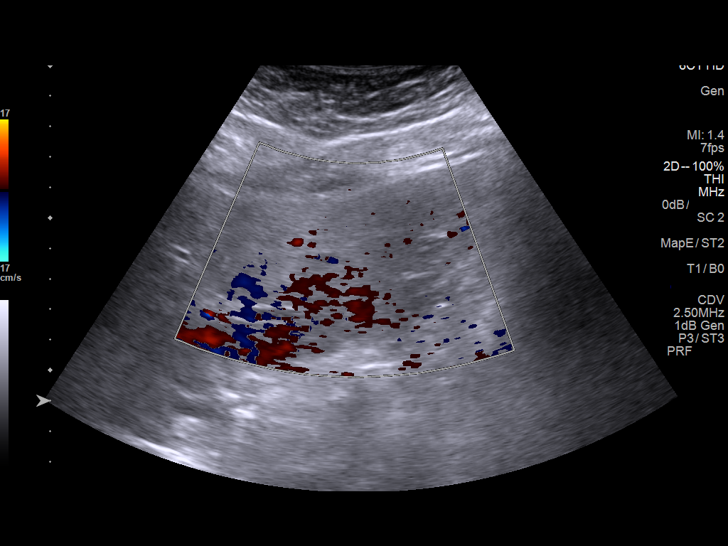
[im 17/26]
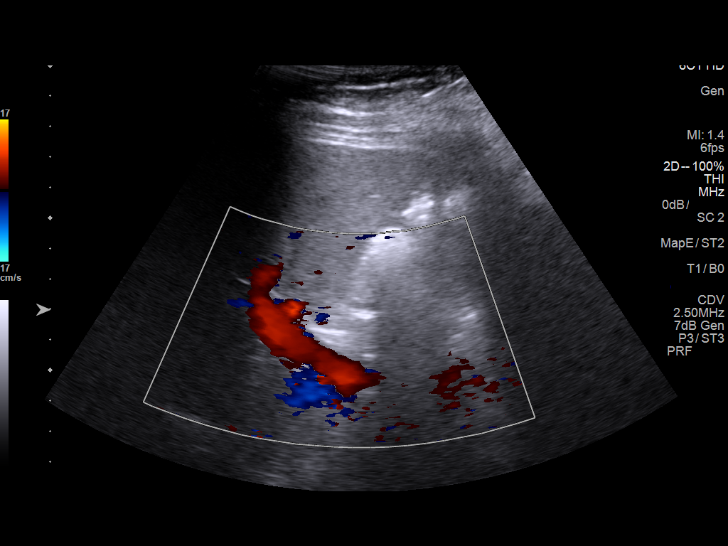
[im 19/26]
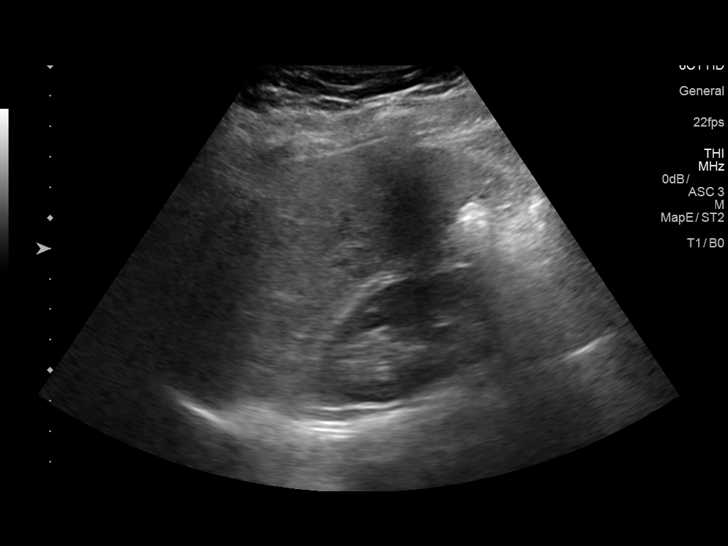
[im 21/26]
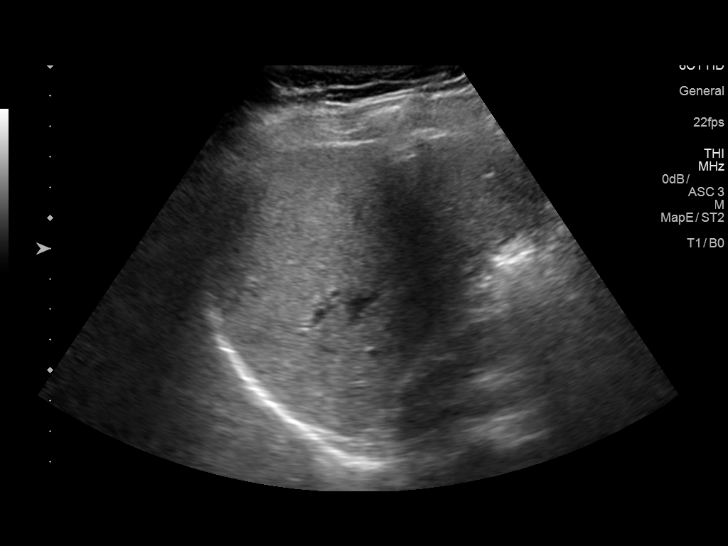
[im 23/26]
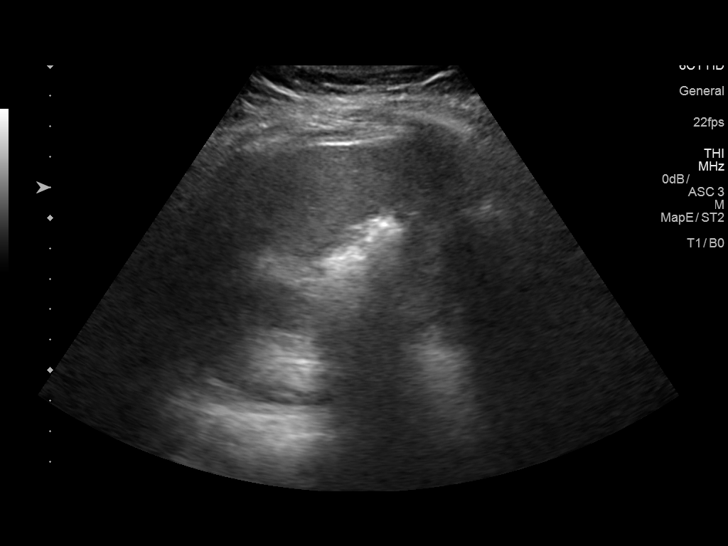
[im 26/26]
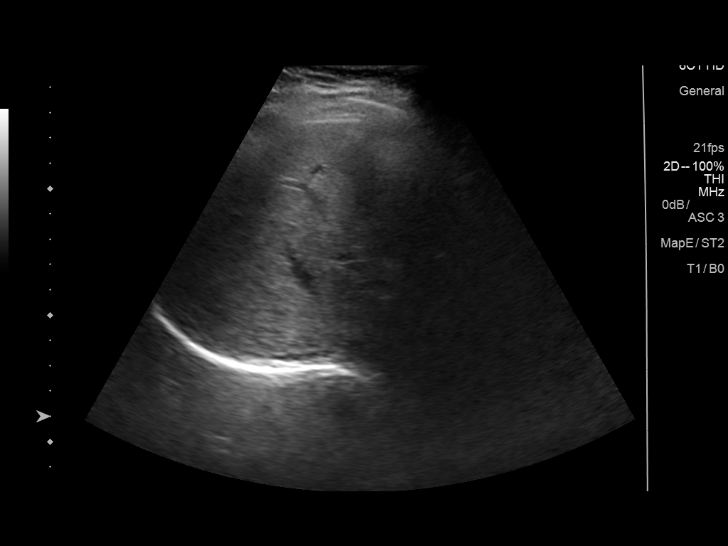

[14 of 25 positions shown; findings below may reference images not displayed]

FINDINGS: Gallbladder:

Surgically absent

Common bile duct:

Diameter: 3 mm

Liver:

Subcentimeter tiny hypoechoic cyst in the left hepatic lobe measures
5 mm. No other significant hepatic abnormality or biliary
dilatation. Mild increased hepatic echogenicity may represent early
hepatic steatosis. Portal vein is patent on color Doppler imaging
with normal direction of blood flow towards the liver.
IMPRESSION: Remote cholecystectomy.

No acute or significant finding by ultrasound.

Query mild hepatic steatosis

## 2019-02-10 ENCOUNTER — Ambulatory Visit: Payer: Managed Care, Other (non HMO) | Admitting: Internal Medicine

## 2019-02-10 ENCOUNTER — Encounter: Payer: Self-pay | Admitting: Internal Medicine

## 2019-02-10 VITALS — BP 118/70 | HR 86 | Temp 98.2°F | Resp 16 | Wt 170.0 lb

## 2019-02-10 DIAGNOSIS — R6889 Other general symptoms and signs: Secondary | ICD-10-CM | POA: Diagnosis not present

## 2019-02-10 MED ORDER — HYDROCODONE-HOMATROPINE 5-1.5 MG/5ML PO SYRP: 5.0000 mL | ORAL_SOLUTION | Freq: Three times a day (TID) | ORAL | 0 refills | Status: DC | PRN

## 2019-02-10 NOTE — Progress Notes (Signed)
HPI  Pt presents to the clinic today with c/o runny nose, nasal congestion, ear pain, sore throat and cough. She reports this started 5 days ago. She is not blowing anything out of her nose. She denies ear drainage or decreased hearing. She denies difficulty swallowing. The cough is non productive. She has not run fevers but has had chills and body aches. She has tried Dayquil with minimal relief. She has no history of allergies. She has not had sick contacts. She did not get her flu shot.  Review of Systems      Past Medical History:  Diagnosis Date  . Elevated antinuclear antibody (ANA) level    Negative workup for Lupus.  . Fracture of left patella 04/30/2018  . Hyperlipidemia   . Hypertension    Controlled  . Osteoarthritis   . Sciatica   . Vitamin D deficiency     History reviewed. No pertinent family history.  Social History   Socioeconomic History  . Marital status: Married    Spouse name: Not on file  . Number of children: Not on file  . Years of education: Not on file  . Highest education level: Not on file  Occupational History  . Not on file  Social Needs  . Financial resource strain: Not on file  . Food insecurity:    Worry: Not on file    Inability: Not on file  . Transportation needs:    Medical: Not on file    Non-medical: Not on file  Tobacco Use  . Smoking status: Never Smoker  . Smokeless tobacco: Never Used  Substance and Sexual Activity  . Alcohol use: Yes    Alcohol/week: 0.0 standard drinks    Comment: sociall  . Drug use: No  . Sexual activity: Not on file  Lifestyle  . Physical activity:    Days per week: Not on file    Minutes per session: Not on file  . Stress: Not on file  Relationships  . Social connections:    Talks on phone: Not on file    Gets together: Not on file    Attends religious service: Not on file    Active member of club or organization: Not on file    Attends meetings of clubs or organizations: Not on file     Relationship status: Not on file  . Intimate partner violence:    Fear of current or ex partner: Not on file    Emotionally abused: Not on file    Physically abused: Not on file    Forced sexual activity: Not on file  Other Topics Concern  . Not on file  Social History Narrative   Married.   Highest level of education Bachelors.    Works as a Education officer, museum.    Allergies  Allergen Reactions  . Cefazolin Swelling and Anaphylaxis    Difficulty breathing  . Sulfa Antibiotics Rash     Constitutional: Positive body aches, fatigue and chills. Denies fever or abrupt weight changes.  HEENT:  Positive nasal congestion, ear pain, sore throat. Denies eye redness, eye pain, pressure behind the eyes, facial pain, ringing in the ears, wax buildup, runny nose or bloody nose. Respiratory: Positive cough. Denies difficulty breathing or shortness of breath.  Cardiovascular: Denies chest pain, chest tightness, palpitations or swelling in the hands or feet.   No other specific complaints in a complete review of systems (except as listed in HPI above).  Objective:   BP 118/70   Pulse  86   Temp 98.2 F (36.8 C) (Oral)   Resp 16   Wt 170 lb (77.1 kg)   SpO2 100%   BMI 28.29 kg/m  Wt Readings from Last 3 Encounters:  02/10/19 170 lb (77.1 kg)  12/27/18 172 lb 8 oz (78.2 kg)  06/10/18 175 lb 4 oz (79.5 kg)     General: Appears her stated age,  in NAD. HEENT: Head: normal shape and size, no sinus tenderness noted;  Ears: Tm's gray and intact, normal light reflex; Nose: mucosa boggy and moist, turbinates swollen; Throat/Mouth: + PND. Teeth present, mucosa erythematous and moist, no exudate noted, no lesions or ulcerations noted.  Neck: No cervical lymphadenopathy.  Cardiovascular: Normal rate and rhythm. S1,S2 noted.  No murmur, rubs or gallops noted.  Pulmonary/Chest: Normal effort and positive vesicular breath sounds. No respiratory distress. No wheezes, rales or ronchi noted.        Assessment & Plan:   Flu Like Symptoms:  Discussed why we would not test at the flu at this point Get some rest and drink plenty of water Ibuprofen as needed for fever and body aches Flonase as needed for nasal congestion eRx for Hycodan cough syrup  RTC as needed or if symptoms persist.   Webb Silversmith, NP `+

## 2019-02-10 NOTE — Patient Instructions (Signed)
Viral Illness, Adult °Viruses are tiny germs that can get into a person's body and cause illness. There are many different types of viruses, and they cause many types of illness. Viral illnesses can range from mild to severe. They can affect various parts of the body. °Common illnesses that are caused by a virus include colds and the flu. Viral illnesses also include serious conditions such as HIV/AIDS (human immunodeficiency virus/acquired immunodeficiency syndrome). A few viruses have been linked to certain cancers. °What are the causes? °Many types of viruses can cause illness. Viruses invade cells in your body, multiply, and cause the infected cells to malfunction or die. When the cell dies, it releases more of the virus. When this happens, you develop symptoms of the illness, and the virus continues to spread to other cells. If the virus takes over the function of the cell, it can cause the cell to divide and grow out of control, as is the case when a virus causes cancer. °Different viruses get into the body in different ways. You can get a virus by: °· Swallowing food or water that is contaminated with the virus. °· Breathing in droplets that have been coughed or sneezed into the air by an infected person. °· Touching a surface that has been contaminated with the virus and then touching your eyes, nose, or mouth. °· Being bitten by an insect or animal that carries the virus. °· Having sexual contact with a person who is infected with the virus. °· Being exposed to blood or fluids that contain the virus, either through an open cut or during a transfusion. °If a virus enters your body, your body's defense system (immune system) will try to fight the virus. You may be at higher risk for a viral illness if your immune system is weak. °What are the signs or symptoms? °Symptoms vary depending on the type of virus and the location of the cells that it invades. Common symptoms of the main types of viral illnesses  include: °Cold and flu viruses °· Fever. °· Headache. °· Sore throat. °· Muscle aches. °· Nasal congestion. °· Cough. °Digestive system (gastrointestinal) viruses °· Fever. °· Abdominal pain. °· Nausea. °· Diarrhea. °Liver viruses (hepatitis) °· Loss of appetite. °· Tiredness. °· Yellowing of the skin (jaundice). °Brain and spinal cord viruses °· Fever. °· Headache. °· Stiff neck. °· Nausea and vomiting. °· Confusion or sleepiness. °Skin viruses °· Warts. °· Itching. °· Rash. °Sexually transmitted viruses °· Discharge. °· Swelling. °· Redness. °· Rash. °How is this treated? °Viruses can be difficult to treat because they live within cells. Antibiotic medicines do not treat viruses because these drugs do not get inside cells. Treatment for a viral illness may include: °· Resting and drinking plenty of fluids. °· Medicines to relieve symptoms. These can include over-the-counter medicine for pain and fever, medicines for cough or congestion, and medicines to relieve diarrhea. °· Antiviral medicines. These drugs are available only for certain types of viruses. They may help reduce flu symptoms if taken early. There are also many antiviral medicines for hepatitis and HIV/AIDS. °Some viral illnesses can be prevented with vaccinations. A common example is the flu shot. °Follow these instructions at home: °Medicines ° °· Take over-the-counter and prescription medicines only as told by your health care provider. °· If you were prescribed an antiviral medicine, take it as told by your health care provider. Do not stop taking the medicine even if you start to feel better. °· Be aware of when   antibiotics are needed and when they are not needed. Antibiotics do not treat viruses. If your health care provider thinks that you may have a bacterial infection as well as a viral infection, you may get an antibiotic. °? Do not ask for an antibiotic prescription if you have been diagnosed with a viral illness. That will not make your  illness go away faster. °? Frequently taking antibiotics when they are not needed can lead to antibiotic resistance. When this develops, the medicine no longer works against the bacteria that it normally fights. °General instructions °· Drink enough fluids to keep your urine clear or pale yellow. °· Rest as much as possible. °· Return to your normal activities as told by your health care provider. Ask your health care provider what activities are safe for you. °· Keep all follow-up visits as told by your health care provider. This is important. °How is this prevented? °Take these actions to reduce your risk of viral infection: °· Eat a healthy diet and get enough rest. °· Wash your hands often with soap and water. This is especially important when you are in public places. If soap and water are not available, use hand sanitizer. °· Avoid close contact with friends and family who have a viral illness. °· If you travel to areas where viral gastrointestinal infection is common, avoid drinking water or eating raw food. °· Keep your immunizations up to date. Get a flu shot every year as told by your health care provider. °· Do not share toothbrushes, nail clippers, razors, or needles with other people. °· Always practice safe sex. ° °Contact a health care provider if: °· You have symptoms of a viral illness that do not go away. °· Your symptoms come back after going away. °· Your symptoms get worse. °Get help right away if: °· You have trouble breathing. °· You have a severe headache or a stiff neck. °· You have severe vomiting or abdominal pain. °This information is not intended to replace advice given to you by your health care provider. Make sure you discuss any questions you have with your health care provider. °Document Released: 04/14/2016 Document Revised: 05/17/2016 Document Reviewed: 04/14/2016 °Elsevier Interactive Patient Education © 2019 [employer] ° °

## 2019-02-11 ENCOUNTER — Other Ambulatory Visit: Payer: Self-pay | Admitting: Primary Care

## 2019-02-11 DIAGNOSIS — I10 Essential (primary) hypertension: Secondary | ICD-10-CM

## 2019-02-12 ENCOUNTER — Telehealth: Payer: Self-pay | Admitting: Primary Care

## 2019-02-12 NOTE — Telephone Encounter (Signed)
Noted. Samantha Hall is aware.

## 2019-02-12 NOTE — Telephone Encounter (Signed)
Pt calling and wanting to know where at in [place] can she view her mammogram results. Please advise

## 2019-02-13 ENCOUNTER — Ambulatory Visit: Payer: Managed Care, Other (non HMO) | Admitting: Primary Care

## 2019-02-13 ENCOUNTER — Encounter: Payer: Self-pay | Admitting: Primary Care

## 2019-02-13 ENCOUNTER — Ambulatory Visit (INDEPENDENT_AMBULATORY_CARE_PROVIDER_SITE_OTHER)
Admission: RE | Admit: 2019-02-13 | Discharge: 2019-02-13 | Disposition: A | Discharge status: Home / Self Care | Payer: Managed Care, Other (non HMO) | Source: Ambulatory Visit | Attending: Primary Care | Admitting: Primary Care

## 2019-02-13 VITALS — BP 114/70 | HR 70 | Temp 98.0°F | Ht 60.0 in | Wt 166.8 lb

## 2019-02-13 DIAGNOSIS — R1010 Upper abdominal pain, unspecified: Secondary | ICD-10-CM

## 2019-02-13 DIAGNOSIS — B9789 Other viral agents as the cause of diseases classified elsewhere: Secondary | ICD-10-CM

## 2019-02-13 DIAGNOSIS — J069 Acute upper respiratory infection, unspecified: Secondary | ICD-10-CM

## 2019-02-13 NOTE — Assessment & Plan Note (Signed)
Persists and is now more lower rib and right lateral side pain. Unclear etiology, abdominal work-up including labs and imaging negative. Suspect muscular involvement but will check chest x-ray today to rule out any other potential cause. Recommended heat, stretching.

## 2019-02-13 NOTE — Progress Notes (Signed)
Subjective:    Patient ID: Samantha Hall, female    DOB: 1961-05-31, 58 y.o.   MRN: [id number]  HPI  Samantha Hall is a 58 year old female who presents today with a chief complaint of cough and right sided rib and side pain.  She was evaluated by Samantha Hall on 02/10/19 with a five day history of cough, rhinorrhea, nasal congestion, otalgia, sore throat. She had tried Dayqil without improvement. Her exam was consistent for viral URI so she was treated with Flonase, Ibuprofen, Hycodan cough suppressant.   Since her last visit she's noticed some improvement in cough and fatigue. Her cough is non productive. She's been out of work during this entire week. She is taking the Hycodan and Flonase with some improvement.   Chronic right anterior lower rib/upper abdominal pain for the last several months.  Work-up completed in early January 2020 and was unremarkable.  She does report right side pain pain as well that began 1 week ago.  Currently with viral URI and has been coughing.  She underwent mammogram in mid February 2020 which was negative.  Review of Systems  Constitutional: Positive for fatigue. Negative for fever.  HENT: Positive for congestion, postnasal drip and rhinorrhea. Negative for sinus pressure and sore throat.   Respiratory: Positive for cough.   Gastrointestinal: Negative for abdominal pain and nausea.  Musculoskeletal: Positive for myalgias.       Past Medical History:  Diagnosis Date  . Elevated antinuclear antibody (ANA) level    Negative workup for Lupus.  . Fracture of left patella 04/30/2018  . Hyperlipidemia   . Hypertension    Controlled  . Osteoarthritis   . Sciatica   . Vitamin D deficiency      Social History   Socioeconomic History  . Marital status: Married    Spouse name: Not on file  . Number of children: Not on file  . Years of education: Not on file  . Highest education level: Not on file  Occupational History  . Not on file  Social Needs    . Financial resource strain: Not on file  . Food insecurity:    Worry: Not on file    Inability: Not on file  . Transportation needs:    Medical: Not on file    Non-medical: Not on file  Tobacco Use  . Smoking status: Never Smoker  . Smokeless tobacco: Never Used  Substance and Sexual Activity  . Alcohol use: Yes    Alcohol/week: 0.0 standard drinks    Comment: sociall  . Drug use: No  . Sexual activity: Not on file  Lifestyle  . Physical activity:    Days per week: Not on file    Minutes per session: Not on file  . Stress: Not on file  Relationships  . Social connections:    Talks on phone: Not on file    Gets together: Not on file    Attends religious service: Not on file    Active member of club or organization: Not on file    Attends meetings of clubs or organizations: Not on file    Relationship status: Not on file  . Intimate partner violence:    Fear of current or ex partner: Not on file    Emotionally abused: Not on file    Physically abused: Not on file    Forced sexual activity: Not on file  Other Topics Concern  . Not on file  Social History Narrative  Married.   Highest level of education Bachelors.    Works as a Education officer, museum.    Past Surgical History:  Procedure Laterality Date  . ABDOMINAL HYSTERECTOMY  2002  . CHOLECYSTECTOMY  2015  . TUBAL LIGATION  1984    No family history on file.  Allergies  Allergen Reactions  . Cefazolin Swelling and Anaphylaxis    Difficulty breathing  . Sulfa Antibiotics Rash    Current Outpatient Medications on File Prior to Visit  Medication Sig Dispense Refill  . amLODipine (NORVASC) 5 MG tablet TAKE 1 TABLET BY MOUTH EVERY DAY 90 tablet 1  . aspirin EC 81 MG tablet Take 81 mg by mouth daily.    Marland Kitchen b complex vitamins tablet Take 1 tablet by mouth daily.    . Cholecalciferol (VITAMIN D) 2000 units CAPS Take by mouth.    . diclofenac (VOLTAREN) 75 MG EC tablet Take 1 tablet (75 mg total) by mouth 2 (two)  times daily as needed. For inflammation/pain. 60 tablet 0  . HYDROcodone-homatropine (HYCODAN) 5-1.5 MG/5ML syrup Take 5 mLs by mouth every 8 (eight) hours as needed for cough. 120 mL 0  . hydrOXYzine (ATARAX/VISTARIL) 10 MG tablet TAKE 1 CAPSULE BY MOUTH UP TO TWICE DAILY AS NEEDED FOR ANXIETY. 30 tablet 0  . ibuprofen (ADVIL,MOTRIN) 200 MG tablet Take 400 mg every 6 (six) hours as needed by mouth for moderate pain.    . Multiple Vitamin (MULTIVITAMIN) tablet Take 1 tablet by mouth daily.    . TURMERIC PO Take 4 capsules by mouth daily.    . cyclobenzaprine (FLEXERIL) 10 MG tablet Take 1 tablet (10 mg total) by mouth 3 (three) times daily as needed for muscle spasms. (Patient not taking: Reported on 02/13/2019) 30 tablet 0   No current facility-administered medications on file prior to visit.     BP 114/70   Pulse 70   Temp 98 F (36.7 C) (Oral)   Ht 5' (1.524 m)   Wt 166 lb 12 oz (75.6 kg)   SpO2 98%   BMI 32.57 kg/m    Objective:   Physical Exam  Constitutional: She appears well-nourished. She does not appear ill.  HENT:  Right Ear: Tympanic membrane and ear canal normal.  Left Ear: Tympanic membrane and ear canal normal.  Nose: Mucosal edema present. Right sinus exhibits no maxillary sinus tenderness and no frontal sinus tenderness. Left sinus exhibits no maxillary sinus tenderness and no frontal sinus tenderness.  Mouth/Throat: Oropharynx is clear and moist.  Neck: Neck supple.  Cardiovascular: Normal rate and regular rhythm.  Respiratory: Effort normal and breath sounds normal. She has no wheezes.  GI: Soft. Normal appearance. There is no abdominal tenderness.    Skin: Skin is warm and dry.           Assessment & Plan:  Viral URI:  Cough, nasal congestion, fatigue, rhinorrhea x8 days. Overall improving with residual effects. Do suspect continued viral etiology and will continue with conservative treatment. Continue Flonase, Hycodan as needed.  Recommended  antihistamine daily. Exam overall unremarkable, clear lungs. Follow-up as needed.  Work note provided.  Pleas Koch, NP

## 2019-02-13 NOTE — Patient Instructions (Signed)
Complete xray(s) prior to leaving today. I will notify you of your results once received.  Continue Flonase and Hycodan as needed. Start an antihistamine such as Claritin, Allegra, Zyrtec.  It was a pleasure to see you today!

## 2019-02-14 ENCOUNTER — Encounter: Payer: Self-pay | Admitting: Primary Care

## 2019-03-11 ENCOUNTER — Ambulatory Visit: Payer: Managed Care, Other (non HMO) | Admitting: Primary Care

## 2019-03-11 ENCOUNTER — Other Ambulatory Visit: Payer: Self-pay | Admitting: Primary Care

## 2019-03-11 ENCOUNTER — Other Ambulatory Visit: Payer: Self-pay

## 2019-03-11 ENCOUNTER — Encounter: Payer: Self-pay | Admitting: Primary Care

## 2019-03-11 VITALS — BP 114/70 | HR 63 | Temp 98.2°F | Ht 65.0 in | Wt 167.5 lb

## 2019-03-11 DIAGNOSIS — R202 Paresthesia of skin: Secondary | ICD-10-CM | POA: Insufficient documentation

## 2019-03-11 DIAGNOSIS — R7303 Prediabetes: Secondary | ICD-10-CM

## 2019-03-11 DIAGNOSIS — R200 Anesthesia of skin: Secondary | ICD-10-CM

## 2019-03-11 LAB — BASIC METABOLIC PANEL
BUN: 18 mg/dL (ref 6–23)
CO2: 32 mEq/L (ref 19–32)
Calcium: 9.6 mg/dL (ref 8.4–10.5)
Chloride: 101 mEq/L (ref 96–112)
Creatinine, Ser: 0.98 mg/dL (ref 0.40–1.20)
GFR: 70.34 mL/min (ref >60.00)
Glucose, Bld: 96 mg/dL (ref 70–99)
Potassium: 3.6 mEq/L (ref 3.5–5.1)
Sodium: 139 mEq/L (ref 135–145)

## 2019-03-11 LAB — MAGNESIUM: Magnesium: 2 mg/dL (ref 1.5–2.5)

## 2019-03-11 LAB — POC URINALSYSI DIPSTICK (AUTOMATED)
Bilirubin, UA: NEGATIVE
Blood, UA: NEGATIVE
Glucose, UA: NEGATIVE
Ketones, UA: NEGATIVE
Leukocytes, UA: NEGATIVE
Nitrite, UA: NEGATIVE
Protein, UA: NEGATIVE
Spec Grav, UA: 1.025 (ref 1.010–1.025)
Urobilinogen, UA: 0.2 E.U./dL
pH, UA: 6 (ref 5.0–8.0)

## 2019-03-11 LAB — HEMOGLOBIN A1C: Hgb A1c MFr Bld: 6.4 % (ref 4.6–6.5)

## 2019-03-11 LAB — VITAMIN B12: Vitamin B-12: 631 pg/mL (ref 211–911)

## 2019-03-11 LAB — VITAMIN D 25 HYDROXY (VIT D DEFICIENCY, FRACTURES): VITD: 33.35 ng/mL (ref 30.00–100.00)

## 2019-03-11 NOTE — Assessment & Plan Note (Signed)
Some elevated glucose readings, encouraged her to work on diet, start with regular exercise. Repeat A1C pending today.

## 2019-03-11 NOTE — Progress Notes (Signed)
Subjective:    Patient ID: Samantha Hall, female    DOB: 01-Apr-1961, 58 y.o.   MRN: [id number]  HPI  Ms. Deahl is a 58 year old female with a history of hypertension, arthritis, elevated LFT's who presents today with multiple complaints.  1) Yellow Urine: Over the last several days she's noticed yellow/golden yellow urine. She's been drinking alkaline water for the last four days. She endorses drinking 2-3 bottles of water in general during most days. She thinks she's noticed some dysuria. Denies hematuria, frequency, pelvic pressure, vaginal symptoms.   2) Prediabetes: A1C of 6.0 in June 2019. She's been checking her glucose over the last four days and endorses some high readings. Generally tries to eat a healthy diet, limits sweets, eats vegetables/salads/leab protein/occasional fried food.   AM fasting: 138, 100, 108 Before lunch: 96 1-2 hours after lunch: 96, 108 Before dinner: 66 1-2 hours after dinner: 140, 110 Bedtime: 99  3) Tingling/Burning/Numbness: Also with cold sensation. Located to the fingers, hand and dorsal toes, intermittent for the last 1-2 months. She will notice her symptoms with movement and with rest. Yesterday she noticed it when getting up from working, lasted for a few hours. She does notice cramping to her lower extremities, does take calcium and magnesium, mostly noticeable at night. Also taking ome vitamin D. Denies calf pain and swelling.   Review of Systems  Constitutional: Negative for fever.  Respiratory: Negative for shortness of breath.   Cardiovascular: Negative for chest pain.  Gastrointestinal: Negative for abdominal pain.  Endocrine: Negative for polyuria.  Genitourinary: Positive for dysuria. Negative for flank pain, hematuria and vaginal discharge.  Neurological: Positive for numbness. Negative for dizziness and weakness.       Past Medical History:  Diagnosis Date  . Elevated antinuclear antibody (ANA) level    Negative workup for  Lupus.  . Fracture of left patella 04/30/2018  . Hyperlipidemia   . Hypertension    Controlled  . Osteoarthritis   . Sciatica   . Vitamin D deficiency      Social History   Socioeconomic History  . Marital status: Married    Spouse name: Not on file  . Number of children: Not on file  . Years of education: Not on file  . Highest education level: Not on file  Occupational History  . Not on file  Social Needs  . Financial resource strain: Not on file  . Food insecurity:    Worry: Not on file    Inability: Not on file  . Transportation needs:    Medical: Not on file    Non-medical: Not on file  Tobacco Use  . Smoking status: Never Smoker  . Smokeless tobacco: Never Used  Substance and Sexual Activity  . Alcohol use: Yes    Alcohol/week: 0.0 standard drinks    Comment: sociall  . Drug use: No  . Sexual activity: Not on file  Lifestyle  . Physical activity:    Days per week: Not on file    Minutes per session: Not on file  . Stress: Not on file  Relationships  . Social connections:    Talks on phone: Not on file    Gets together: Not on file    Attends religious service: Not on file    Active member of club or organization: Not on file    Attends meetings of clubs or organizations: Not on file    Relationship status: Not on file  . Intimate partner violence:  Fear of current or ex partner: Not on file    Emotionally abused: Not on file    Physically abused: Not on file    Forced sexual activity: Not on file  Other Topics Concern  . Not on file  Social History Narrative   Married.   Highest level of education Bachelors.    Works as a Education officer, [employer].    Past Surgical History:  Procedure Laterality Date  . ABDOMINAL HYSTERECTOMY  2002  . CHOLECYSTECTOMY  2015  . TUBAL LIGATION  1984    No family history on file.  Allergies  Allergen Reactions  . Cefazolin Swelling and Anaphylaxis    Difficulty breathing  . Sulfa Antibiotics Rash    Current  Outpatient Medications on File Prior to Visit  Medication Sig Dispense Refill  . amLODipine (NORVASC) 5 MG tablet TAKE 1 TABLET BY MOUTH EVERY DAY 90 tablet 1  . aspirin EC 81 MG tablet Take 81 mg by mouth daily.    Marland Kitchen b complex vitamins tablet Take 1 tablet by mouth daily.    . Cholecalciferol (VITAMIN D) 2000 units CAPS Take by mouth.    . diclofenac (VOLTAREN) 75 MG EC tablet Take 1 tablet (75 mg total) by mouth 2 (two) times daily as needed. For inflammation/pain. 60 tablet 0  . hydrOXYzine (ATARAX/VISTARIL) 10 MG tablet TAKE 1 CAPSULE BY MOUTH UP TO TWICE DAILY AS NEEDED FOR ANXIETY. 30 tablet 0  . ibuprofen (ADVIL,MOTRIN) 200 MG tablet Take 400 mg every 6 (six) hours as needed by mouth for moderate pain.    . Multiple Vitamin (MULTIVITAMIN) tablet Take 1 tablet by mouth daily.    . TURMERIC PO Take 4 capsules by mouth daily.    . cyclobenzaprine (FLEXERIL) 10 MG tablet Take 1 tablet (10 mg total) by mouth 3 (three) times daily as needed for muscle spasms. (Patient not taking: Reported on 03/11/2019) 30 tablet 0   No current facility-administered medications on file prior to visit.     BP 114/70   Pulse 63   Temp 98.2 F (36.8 C) (Oral)   Ht 5\' 5"  (1.651 m)   Wt 167 lb 8 oz (76 kg)   SpO2 97%   BMI 27.87 kg/m    Objective:   Physical Exam  Constitutional: She appears well-nourished.  Neck: Neck supple.  Cardiovascular: Normal rate and regular rhythm.  Pulses:      Dorsalis pedis pulses are 2+ on the right side and 2+ on the left side.       Posterior tibial pulses are 2+ on the right side and 2+ on the left side.  Respiratory: Effort normal and breath sounds normal.  Neurological:  No decreased sensation with monofilament exam bilaterally.   Skin: Skin is warm and dry.  Psychiatric: She has a normal mood and affect.           Assessment & Plan:  Dark Urine:  Present for the last several days, no other classic symptoms. UA today negative for infection, blood.  Medium yellow color. Encouraged to increase water to 64 ounces daily. Continue to monitor. BMP pending.  Pleas Koch, NP

## 2019-03-11 NOTE — Patient Instructions (Addendum)
Stop by the lab prior to leaving today. I will notify you of your results once received.   Ensure you are consuming 64 ounces of water daily.  Try stretching your legs every evening before bed and every morning when waking.   It was a pleasure to see you today!

## 2019-03-11 NOTE — Assessment & Plan Note (Signed)
Acute for the last 1-2 months. Exam today unremarkable. Check labs today including B12, A1C, Magnesium, Vitamin D. CBC from January 2020 unremarkable.

## 2019-03-31 ENCOUNTER — Ambulatory Visit (INDEPENDENT_AMBULATORY_CARE_PROVIDER_SITE_OTHER): Payer: Managed Care, Other (non HMO) | Admitting: Primary Care

## 2019-03-31 ENCOUNTER — Encounter: Payer: Self-pay | Admitting: Primary Care

## 2019-03-31 DIAGNOSIS — R21 Rash and other nonspecific skin eruption: Secondary | ICD-10-CM | POA: Diagnosis not present

## 2019-03-31 MED ORDER — TRIAMCINOLONE ACETONIDE 0.5 % EX CREA: 1.0000 "application " | TOPICAL_CREAM | Freq: Two times a day (BID) | CUTANEOUS | 0 refills | Status: DC

## 2019-03-31 NOTE — Patient Instructions (Signed)
Start triamcinolone 0.5% cream. Apply this twice daily to the spots for one week. Do not apply this to your face.  Also consider taking a daily antihistamine such as Zyrtec, Claritin, Allegra as this may help to reduce the size/amount of bumps.  Update me in one week if no improvement.  It was a pleasure to see you today! Allie Bossier, NP-C

## 2019-03-31 NOTE — Progress Notes (Signed)
Subjective:    Patient ID: Samantha Hall, female    DOB: 1961-08-18, 58 y.o.   MRN: [id number]  HPI  Virtual Visit via Video Note  I connected with Samantha Hall on 03/31/19 at 12:00 PM EDT by a video enabled telemedicine application and verified that I am speaking with the correct person using two identifiers.   I discussed the limitations of evaluation and management by telemedicine and the availability of in person appointments. The patient expressed understanding and agreed to proceed. She is at home, I am in the office.  History of Present Illness:  [name] is a 58 year old female who presents today via video with a chief complaint of skin bumps.  The bumps are located to the bilateral lower extremities proximal to and at ankles which began one week ago. She denies itching, swelling, pain. She also denies changes in food/medication/soaps. She's not applied anything OTC for the bumps. She's not been out in the woods. The bumps do not bother her at all.   Observations/Objective:  Several red, raised, intact bumps to bilateral lower extremities to mid calf down to ankles. No drainage or surrounding erythema.  Appear to be insect bites at first glance, but spots do not bother patient.  Assessment and Plan:  Unclear etiology. Does not appear infectious, fungal, allergy induced. Will trial topical steroid treatment first, if no improvement then consider oral steroids. Also now consider antihistamine.  She will update in one week.  Follow Up Instructions:  Start triamcinolone 0.5% cream. Apply this twice daily to the spots for one week. Do not apply this to your face.  Also consider taking a daily antihistamine such as Zyrtec, Claritin, Allegra as this may help to reduce the size/amount of bumps.  Update me in one week if no improvement.  It was a pleasure to see you today! Allie Bossier, NP-C    I discussed the assessment and treatment plan with the patient. The  patient was provided an opportunity to ask questions and all were answered. The patient agreed with the plan and demonstrated an understanding of the instructions.   The patient was advised to call back or seek an in-person evaluation if the symptoms worsen or if the condition fails to improve as anticipated.     Pleas Koch, NP   Review of Systems  Constitutional: Negative for fever.  Skin: Positive for color change. Negative for wound.       Skin bumps       Past Medical History:  Diagnosis Date  . Elevated antinuclear antibody (ANA) level    Negative workup for Lupus.  . Fracture of left patella 04/30/2018  . Hyperlipidemia   . Hypertension    Controlled  . Osteoarthritis   . Sciatica   . Vitamin D deficiency      Social History   Socioeconomic History  . Marital status: Married    Spouse name: Not on file  . Number of children: Not on file  . Years of education: Not on file  . Highest education level: Not on file  Occupational History  . Not on file  Social Needs  . Financial resource strain: Not on file  . Food insecurity:    Worry: Not on file    Inability: Not on file  . Transportation needs:    Medical: Not on file    Non-medical: Not on file  Tobacco Use  . Smoking status: Never Smoker  . Smokeless tobacco: Never Used  Substance and Sexual Activity  . Alcohol use: Yes    Alcohol/week: 0.0 standard drinks    Comment: sociall  . Drug use: No  . Sexual activity: Not on file  Lifestyle  . Physical activity:    Days per week: Not on file    Minutes per session: Not on file  . Stress: Not on file  Relationships  . Social connections:    Talks on phone: Not on file    Gets together: Not on file    Attends religious service: Not on file    Active member of club or organization: Not on file    Attends meetings of clubs or organizations: Not on file    Relationship status: Not on file  . Intimate partner violence:    Fear of current or ex  partner: Not on file    Emotionally abused: Not on file    Physically abused: Not on file    Forced sexual activity: Not on file  Other Topics Concern  . Not on file  Social History Narrative   Married.   Highest level of education Bachelors.    Works as a Education officer, museum.    Past Surgical History:  Procedure Laterality Date  . ABDOMINAL HYSTERECTOMY  2002  . CHOLECYSTECTOMY  2015  . TUBAL LIGATION  1984    No family history on file.  Allergies  Allergen Reactions  . Cefazolin Swelling and Anaphylaxis    Difficulty breathing  . Sulfa Antibiotics Rash    Current Outpatient Medications on File Prior to Visit  Medication Sig Dispense Refill  . amLODipine (NORVASC) 5 MG tablet TAKE 1 TABLET BY MOUTH EVERY DAY 90 tablet 1  . aspirin EC 81 MG tablet Take 81 mg by mouth daily.    Marland Kitchen b complex vitamins tablet Take 1 tablet by mouth daily.    . Cholecalciferol (VITAMIN D) 2000 units CAPS Take by mouth.    . diclofenac (VOLTAREN) 75 MG EC tablet Take 1 tablet (75 mg total) by mouth 2 (two) times daily as needed. For inflammation/pain. 60 tablet 0  . ibuprofen (ADVIL,MOTRIN) 200 MG tablet Take 400 mg every 6 (six) hours as needed by mouth for moderate pain.    . Multiple Vitamin (MULTIVITAMIN) tablet Take 1 tablet by mouth daily.    . TURMERIC PO Take 4 capsules by mouth daily.    . cyclobenzaprine (FLEXERIL) 10 MG tablet Take 1 tablet (10 mg total) by mouth 3 (three) times daily as needed for muscle spasms. (Patient not taking: Reported on 03/31/2019) 30 tablet 0  . hydrOXYzine (ATARAX/VISTARIL) 10 MG tablet TAKE 1 CAPSULE BY MOUTH UP TO TWICE DAILY AS NEEDED FOR ANXIETY. (Patient not taking: Reported on 03/31/2019) 30 tablet 0   No current facility-administered medications on file prior to visit.     There were no vitals taken for this visit.   Objective:   Physical Exam  Respiratory: Effort normal.  Skin: Skin is dry.  Several red, raised, intact bumps to bilateral lower  extremities to mid calf down to ankles. No drainage or surrounding erythema.   Psychiatric: She has a normal mood and affect.           Assessment & Plan:

## 2019-03-31 NOTE — Assessment & Plan Note (Signed)
Unclear etiology. Does not appear infectious, fungal, allergy induced. Will trial topical steroid treatment first, if no improvement then consider oral steroids. Also now consider antihistamine.  She will update in one week.

## 2019-04-25 ENCOUNTER — Ambulatory Visit (INDEPENDENT_AMBULATORY_CARE_PROVIDER_SITE_OTHER): Payer: Managed Care, Other (non HMO) | Admitting: Primary Care

## 2019-04-25 ENCOUNTER — Other Ambulatory Visit: Payer: Self-pay

## 2019-04-25 DIAGNOSIS — R21 Rash and other nonspecific skin eruption: Secondary | ICD-10-CM | POA: Diagnosis not present

## 2019-04-25 MED ORDER — PREDNISONE 20 MG PO TABS: ORAL_TABLET | ORAL | 0 refills | Status: DC

## 2019-04-25 NOTE — Progress Notes (Signed)
Subjective:    Patient ID: Samantha Hall, female    DOB: [birth year], 58 y.o.   MRN: [id number]  HPI  Virtual Visit via Video Note  I connected with Samantha Hall on 04/25/19 at  3:40 PM EDT by a video enabled telemedicine application and verified that I am speaking with the correct person using two identifiers.  Location: Patient: home Provider: office   I discussed the limitations of evaluation and management by telemedicine and the availability of in person appointments. The patient expressed understanding and agreed to proceed.  History of Present Illness:  Samantha Hall is a 58 year old female who presents today with a chief complaint of rash.  The rash is located to the entire face which began 2-3 days ago. She also reports wide spread itching to her face and numbness to the tip of her nose and bilateral cheeks.   She denies changes in medications/food/make up/soaps. She's not been out in the woods. She denies unilateral numbness/tingling, weakness, changes in speech, shortness breath, throat tightness.   She's tried applying Cetaphil lotion, hydrocortisone cream, olive oil, and vasaline without improvement. She's not taking her Allegra.     Observations/Objective:  Wide spread flesh colored/hyperpigmented facial rash bilaterally. No lesions. Small flesh colored bumps. No erythema.  Assessment and Plan:  Unclear etiology, does represent allergic dermatitis. Low suspicion for Shingles given pattern and location.  Will start by having her resume Allegra daily. We discussed different options for treatment including antihistamine with H2 blocker and PRN hydroxyzine vs prednisone. She opts for prednisone.  Rx for prednisone course sent to pharmacy.  She will update.   Follow Up Instructions:  Start prednisone for the rash.  Take 2 tablets for three days then 1 tablet for three days.  Resume your Allegra daily as discussed.  It was a pleasure to see you today!  Allie Bossier, NP-C    I discussed the assessment and treatment plan with the patient. The patient was provided an opportunity to ask questions and all were answered. The patient agreed with the plan and demonstrated an understanding of the instructions.   The patient was advised to call back or seek an in-person evaluation if the symptoms worsen or if the condition fails to improve as anticipated.     Pleas Koch, NP    Review of Systems  HENT: Negative for trouble swallowing and voice change.   Respiratory: Negative for shortness of breath.   Skin: Positive for rash.       Past Medical History:  Diagnosis Date  . Elevated antinuclear antibody (ANA) level    Negative workup for Lupus.  . Fracture of left patella 04/30/2018  . Hyperlipidemia   . Hypertension    Controlled  . Osteoarthritis   . Sciatica   . Vitamin D deficiency      Social History   Socioeconomic History  . Marital status: Married    Spouse name: Not on file  . Number of children: Not on file  . Years of education: Not on file  . Highest education level: Not on file  Occupational History  . Not on file  Social Needs  . Financial resource strain: Not on file  . Food insecurity:    Worry: Not on file    Inability: Not on file  . Transportation needs:    Medical: Not on file    Non-medical: Not on file  Tobacco Use  . Smoking status: Never Smoker  . Smokeless tobacco: Never  Used  Substance and Sexual Activity  . Alcohol use: Yes    Alcohol/week: 0.0 standard drinks    Comment: sociall  . Drug use: No  . Sexual activity: Not on file  Lifestyle  . Physical activity:    Days per week: Not on file    Minutes per session: Not on file  . Stress: Not on file  Relationships  . Social connections:    Talks on phone: Not on file    Gets together: Not on file    Attends religious service: Not on file    Active member of club or organization: Not on file    Attends meetings of clubs or  organizations: Not on file    Relationship status: Not on file  . Intimate partner violence:    Fear of current or ex partner: Not on file    Emotionally abused: Not on file    Physically abused: Not on file    Forced sexual activity: Not on file  Other Topics Concern  . Not on file  Social History Narrative   Married.   Highest level of education Bachelors.    Works as a Education officer, museum.    Past Surgical History:  Procedure Laterality Date  . ABDOMINAL HYSTERECTOMY  2002  . CHOLECYSTECTOMY  2015  . TUBAL LIGATION  1984    No family history on file.  Allergies  Allergen Reactions  . Cefazolin Swelling and Anaphylaxis    Difficulty breathing  . Sulfa Antibiotics Rash    Current Outpatient Medications on File Prior to Visit  Medication Sig Dispense Refill  . amLODipine (NORVASC) 5 MG tablet TAKE 1 TABLET BY MOUTH EVERY DAY 90 tablet 1  . aspirin EC 81 MG tablet Take 81 mg by mouth daily.    Marland Kitchen b complex vitamins tablet Take 1 tablet by mouth daily.    . Cholecalciferol (VITAMIN D) 2000 units CAPS Take by mouth.    . cyclobenzaprine (FLEXERIL) 10 MG tablet Take 1 tablet (10 mg total) by mouth 3 (three) times daily as needed for muscle spasms. (Patient not taking: Reported on 03/31/2019) 30 tablet 0  . diclofenac (VOLTAREN) 75 MG EC tablet Take 1 tablet (75 mg total) by mouth 2 (two) times daily as needed. For inflammation/pain. 60 tablet 0  . hydrOXYzine (ATARAX/VISTARIL) 10 MG tablet TAKE 1 CAPSULE BY MOUTH UP TO TWICE DAILY AS NEEDED FOR ANXIETY. (Patient not taking: Reported on 03/31/2019) 30 tablet 0  . ibuprofen (ADVIL,MOTRIN) 200 MG tablet Take 400 mg every 6 (six) hours as needed by mouth for moderate pain.    . Multiple Vitamin (MULTIVITAMIN) tablet Take 1 tablet by mouth daily.    Marland Kitchen triamcinolone cream (KENALOG) 0.5 % Apply 1 application topically 2 (two) times daily. 30 g 0  . TURMERIC PO Take 4 capsules by mouth daily.     No current facility-administered medications  on file prior to visit.     There were no vitals taken for this visit.   Objective:   Physical Exam  Constitutional: She appears well-nourished.  Respiratory: Effort normal. No respiratory distress.  Skin: Skin is dry.  Wide spread flesh colored/hyperpigmented facial rash bilaterally. No lesions. Small flesh colored bumps. No erythema.   Psychiatric: She has a normal mood and affect.           Assessment & Plan:

## 2019-04-25 NOTE — Patient Instructions (Signed)
Start prednisone for the rash.  Take 2 tablets for three days then 1 tablet for three days.  Resume your Allegra daily as discussed.  It was a pleasure to see you today! Allie Bossier, NP-C

## 2019-04-25 NOTE — Telephone Encounter (Signed)
Patient left a voicemail stating that she sent a mychart message today about her face. Patient stated that she is having some stinging and numbness and requested advice from Allie Bossier NP,

## 2019-06-24 ENCOUNTER — Other Ambulatory Visit: Payer: Self-pay | Admitting: *Deleted

## 2019-06-24 DIAGNOSIS — Z20822 Contact with and (suspected) exposure to covid-19: Secondary | ICD-10-CM

## 2019-06-30 LAB — NOVEL CORONAVIRUS, NAA: SARS-CoV-2, NAA: NOT DETECTED

## 2019-07-23 ENCOUNTER — Other Ambulatory Visit: Payer: Self-pay

## 2019-07-23 ENCOUNTER — Other Ambulatory Visit: Payer: Self-pay | Admitting: Primary Care

## 2019-07-23 DIAGNOSIS — G8929 Other chronic pain: Secondary | ICD-10-CM

## 2019-07-23 DIAGNOSIS — M5441 Lumbago with sciatica, right side: Secondary | ICD-10-CM

## 2019-07-23 MED ORDER — DICLOFENAC SODIUM 75 MG PO TBEC: 75.0000 mg | DELAYED_RELEASE_TABLET | Freq: Two times a day (BID) | ORAL | 0 refills | Status: DC | PRN

## 2019-07-23 NOTE — Telephone Encounter (Signed)
There is a refill request already for this.

## 2019-07-23 NOTE — Telephone Encounter (Signed)
Last prescribed on 12/27/2018. Last seen on 04/25/2019. No future appointment

## 2019-08-06 ENCOUNTER — Other Ambulatory Visit: Payer: Self-pay | Admitting: Primary Care

## 2019-08-06 DIAGNOSIS — R21 Rash and other nonspecific skin eruption: Secondary | ICD-10-CM

## 2019-08-15 ENCOUNTER — Other Ambulatory Visit: Payer: Self-pay | Admitting: Primary Care

## 2019-08-15 DIAGNOSIS — M5441 Lumbago with sciatica, right side: Secondary | ICD-10-CM

## 2019-08-15 DIAGNOSIS — G8929 Other chronic pain: Secondary | ICD-10-CM

## 2019-09-18 ENCOUNTER — Telehealth: Payer: Self-pay | Admitting: Primary Care

## 2019-09-18 NOTE — Telephone Encounter (Signed)
Patient is requesting a call back. She has some information that she would like to discuss following the conversation you all had earlier today    C/B # [phone]

## 2019-09-22 NOTE — Telephone Encounter (Signed)
See my chart message

## 2019-10-11 ENCOUNTER — Other Ambulatory Visit: Payer: Self-pay | Admitting: Primary Care

## 2019-10-11 DIAGNOSIS — I10 Essential (primary) hypertension: Secondary | ICD-10-CM

## 2019-10-24 ENCOUNTER — Other Ambulatory Visit: Payer: Self-pay

## 2019-10-24 DIAGNOSIS — Z20822 Contact with and (suspected) exposure to covid-19: Secondary | ICD-10-CM

## 2019-10-26 LAB — NOVEL CORONAVIRUS, NAA: SARS-CoV-2, NAA: NOT DETECTED

## 2019-10-31 ENCOUNTER — Emergency Department: Payer: Managed Care, Other (non HMO)

## 2019-10-31 ENCOUNTER — Ambulatory Visit: Payer: Self-pay | Admitting: *Deleted

## 2019-10-31 ENCOUNTER — Other Ambulatory Visit: Payer: Self-pay

## 2019-10-31 ENCOUNTER — Emergency Department
Admission: EM | Admit: 2019-10-31 | Discharge: 2019-10-31 | Disposition: A | Discharge status: Home / Self Care | Payer: Managed Care, Other (non HMO) | Attending: Emergency Medicine | Admitting: Emergency Medicine

## 2019-10-31 DIAGNOSIS — K5900 Constipation, unspecified: Secondary | ICD-10-CM | POA: Diagnosis not present

## 2019-10-31 DIAGNOSIS — Z79899 Other long term (current) drug therapy: Secondary | ICD-10-CM | POA: Insufficient documentation

## 2019-10-31 DIAGNOSIS — I10 Essential (primary) hypertension: Secondary | ICD-10-CM | POA: Insufficient documentation

## 2019-10-31 DIAGNOSIS — Z7982 Long term (current) use of aspirin: Secondary | ICD-10-CM | POA: Diagnosis not present

## 2019-10-31 DIAGNOSIS — R1010 Upper abdominal pain, unspecified: Secondary | ICD-10-CM | POA: Insufficient documentation

## 2019-10-31 LAB — COMPREHENSIVE METABOLIC PANEL
ALT: 39 U/L (ref 0–44)
AST: 25 U/L (ref 15–41)
Albumin: 4.1 g/dL (ref 3.5–5.0)
Alkaline Phosphatase: 90 U/L (ref 38–126)
Anion gap: 10 (ref 5–15)
BUN: 18 mg/dL (ref 6–20)
CO2: 27 mmol/L (ref 22–32)
Calcium: 9.3 mg/dL (ref 8.9–10.3)
Chloride: 100 mmol/L (ref 98–111)
Creatinine, Ser: 0.89 mg/dL (ref 0.44–1.00)
GFR calc Af Amer: >60 mL/min (ref >60)
GFR calc non Af Amer: >60 mL/min (ref >60)
Glucose, Bld: 101 mg/dL — ABNORMAL HIGH (ref 70–99)
Potassium: 3.5 mmol/L (ref 3.5–5.1)
Sodium: 137 mmol/L (ref 135–145)
Total Bilirubin: 1.1 mg/dL (ref 0.3–1.2)
Total Protein: 8 g/dL (ref 6.5–8.1)

## 2019-10-31 LAB — LIPASE, BLOOD: Lipase: 33 U/L (ref 11–51)

## 2019-10-31 LAB — URINALYSIS, COMPLETE (UACMP) WITH MICROSCOPIC
Bacteria, UA: NONE SEEN
Bilirubin Urine: NEGATIVE
Glucose, UA: NEGATIVE mg/dL
Hgb urine dipstick: NEGATIVE
Ketones, ur: NEGATIVE mg/dL
Leukocytes,Ua: NEGATIVE
Nitrite: NEGATIVE
Protein, ur: NEGATIVE mg/dL
Specific Gravity, Urine: 1.016 (ref 1.005–1.030)
pH: 6 (ref 5.0–8.0)

## 2019-10-31 LAB — CBC
HCT: 40.1 % (ref 36.0–46.0)
Hemoglobin: 13.8 g/dL (ref 12.0–15.0)
MCH: 29.8 pg (ref 26.0–34.0)
MCHC: 34.4 g/dL (ref 30.0–36.0)
MCV: 86.6 fL (ref 80.0–100.0)
Platelets: 294 10*3/uL (ref 150–400)
RBC: 4.63 MIL/uL (ref 3.87–5.11)
RDW: 12.8 % (ref 11.5–15.5)
WBC: 5.1 10*3/uL (ref 4.0–10.5)
nRBC: 0 % (ref 0.0–0.2)

## 2019-10-31 MED ORDER — IOHEXOL 9 MG/ML PO SOLN
500.0000 mL | ORAL | Status: AC
  Administered 2019-10-31: 500 mL via ORAL
  Filled 2019-10-31 (×2): qty 500

## 2019-10-31 MED ORDER — ALUM & MAG HYDROXIDE-SIMETH 200-200-20 MG/5ML PO SUSP
15.0000 mL | Freq: Once | ORAL | Status: AC
  Administered 2019-10-31: 15 mL via ORAL
  Filled 2019-10-31: qty 30

## 2019-10-31 MED ORDER — IOHEXOL 300 MG/ML  SOLN
100.0000 mL | Freq: Once | INTRAMUSCULAR | Status: AC | PRN
  Administered 2019-10-31: 100 mL via INTRAVENOUS
  Filled 2019-10-31: qty 100

## 2019-10-31 MED ORDER — FAMOTIDINE 20 MG PO TABS
20.0000 mg | ORAL_TABLET | Freq: Once | ORAL | Status: AC
  Administered 2019-10-31: 20 mg via ORAL
  Filled 2019-10-31: qty 1

## 2019-10-31 MED ORDER — ACETAMINOPHEN 500 MG PO TABS
1000.0000 mg | ORAL_TABLET | ORAL | Status: DC
  Filled 2019-10-31: qty 2

## 2019-10-31 NOTE — Telephone Encounter (Signed)
Noted, patient currently checked in at [facility] ED.

## 2019-10-31 NOTE — ED Provider Notes (Signed)
[facility] Emergency Department Provider Note   ____________________________________________   First MD Initiated Contact with Patient 10/31/19 1409     (approximate)  I have reviewed the triage vital signs and the nursing notes.   HISTORY  Chief Complaint Abdominal Pain    HPI Samantha Hall is a 58 y.o. female previous history of hypertension, cholecystectomy  Patient reports about a week ago she had a sharp pain in her upper mid abdomen that lasted for part of a day and went away.  Then last night she was sitting at home, reading when she started to be experiencing pain again in the area.  Pain is located in the upper part of the middle to slightly right upper abdomen.  It was worse last night, but seems relieved somewhat but still having an ongoing discomfort and pain.  12 in the past it could related to a hiatal hernia, also had her gallbladder removed.  No fevers or chills.  Very slight feeling of nausea but no vomiting.  No diarrhea.   No Covid exposure.  No cough cold chest pain or trouble breathing.  Pain is moderate in the mid to right upper abdomen and she also reports the area felt a bit swollen the last week but no overlying redness or rash  Past Medical History:  Diagnosis Date   Elevated antinuclear antibody (ANA) level    Negative workup for Lupus.   Fracture of left patella 04/30/2018   Hyperlipidemia    Hypertension    Controlled   Osteoarthritis    Sciatica    Vitamin D deficiency     Patient Active Problem List   Diagnosis Date Noted   Rash and nonspecific skin eruption 03/31/2019   Numbness and tingling 03/11/2019   Pain of upper abdomen 12/27/2018   Bilateral hand pain 12/27/2018   Chronic shoulder pain 06/10/2018   Elevated LFTs 06/10/2018   Prediabetes 06/10/2018   Knee pain, bilateral 04/29/2018   Situational anxiety 12/05/2017   Chronic low back pain 10/31/2017   Osteoarthritis 02/02/2017    Preventative health care 02/02/2017   Hyperlipidemia 02/02/2017   Essential hypertension 11/22/2015    Past Surgical History:  Procedure Laterality Date   ABDOMINAL HYSTERECTOMY  2002   CHOLECYSTECTOMY  2015   TUBAL LIGATION  1984    Prior to Admission medications   Medication Sig Start Date End Date Taking? Authorizing Provider  amLODipine (NORVASC) 5 MG tablet TAKE 1 TABLET BY MOUTH EVERY DAY 10/13/19   Pleas Koch, NP  aspirin EC 81 MG tablet Take 81 mg by mouth daily.    [provider]  b complex vitamins tablet Take 1 tablet by mouth daily.    [provider]  Cholecalciferol (VITAMIN D) 2000 units CAPS Take by mouth.    [provider]  cyclobenzaprine (FLEXERIL) 10 MG tablet Take 1 tablet (10 mg total) by mouth 3 (three) times daily as needed for muscle spasms. Patient not taking: Reported on 03/31/2019 11/21/17   Pleas Koch, NP  diclofenac (VOLTAREN) 75 MG EC tablet Take 1 tablet (75 mg total) by mouth 2 (two) times daily as needed. For inflammation/pain. 07/23/19   Pleas Koch, NP  hydrOXYzine (ATARAX/VISTARIL) 10 MG tablet TAKE 1 CAPSULE BY MOUTH UP TO TWICE DAILY AS NEEDED FOR ANXIETY. Patient not taking: Reported on 03/31/2019 12/04/18   Pleas Koch, NP  ibuprofen (ADVIL,MOTRIN) 200 MG tablet Take 400 mg every 6 (six) hours as needed by mouth for  moderate pain.    [provider]  Multiple Vitamin (MULTIVITAMIN) tablet Take 1 tablet by mouth daily.    [provider]  predniSONE (DELTASONE) 20 MG tablet Take 2 tablets for three days, then 1 tablet for three days. 04/25/19   Pleas Koch, NP  triamcinolone cream (KENALOG) 0.5 % APPLY TO AFFECTED AREA TWICE A DAY 08/06/19   Pleas Koch, NP  TURMERIC PO Take 4 capsules by mouth daily.    [provider]    Allergies Cefazolin and Sulfa antibiotics  No family history on file.  Social History Social History   Tobacco Use    Smoking status: Never Smoker   Smokeless tobacco: Never Used  Substance Use Topics   Alcohol use: Yes    Alcohol/week: 0.0 standard drinks    Comment: sociall   Drug use: No    Review of Systems Constitutional: No fever/chills Cardiovascular: Denies chest pain. Respiratory: Denies shortness of breath. Gastrointestinal: See HPI  genitourinary: Negative for dysuria. Musculoskeletal: Negative for back pain. Skin: Negative for rash. Neurological: Negative for headaches, areas of focal weakness or numbness.    ____________________________________________   PHYSICAL EXAM:  VITAL SIGNS: ED Triage Vitals  Enc Vitals Group     BP 10/31/19 1150 (!) 117/97     Pulse Rate 10/31/19 1150 63     Resp 10/31/19 1150 18     Temp 10/31/19 1150 98.6 F (37 C)     Temp Source 10/31/19 1150 Oral     SpO2 10/31/19 1150 100 %     Weight 10/31/19 1151 168 lb (76.2 kg)     Height 10/31/19 1151 5\' 5"  (1.651 m)     Head Circumference --      Peak Flow --      Pain Score 10/31/19 1150 6     Pain Loc --      Pain Edu? --      Excl. in [place]? --     Constitutional: Alert and oriented. Well appearing and in no acute distress. Eyes: Conjunctivae are normal. Head: Atraumatic. Nose: No congestion/rhinnorhea. Mouth/Throat: Mucous membranes are moist. Neck: No stridor.  Cardiovascular: Normal rate, regular rhythm. Grossly normal heart sounds.  Good peripheral circulation. Respiratory: Normal respiratory effort.  No retractions. Lungs CTAB. Gastrointestinal: Soft and nontender except for some mild to moderate tenderness reproducible in the epigastrium and slight right upper quadrant.  Negative Murphy.  No notable masses or overlying lesions.. No distention.  Lower abdomen nontender nondistended bilateral.  No rebound guarding or peritonitis Musculoskeletal: No lower extremity tenderness nor edema. Neurologic:  Normal speech and language. No gross focal neurologic deficits are appreciated.  Skin:   Skin is warm, dry and intact. No rash noted. Psychiatric: Mood and affect are normal. Speech and behavior are normal.  ____________________________________________   LABS (all labs ordered are listed, but only abnormal results are displayed)  Labs Reviewed  COMPREHENSIVE METABOLIC PANEL - Abnormal; Notable for the following components:      Result Value   Glucose, Bld 101 (*)    All other components within normal limits  URINALYSIS, COMPLETE (UACMP) WITH MICROSCOPIC - Abnormal; Notable for the following components:   Color, Urine YELLOW (*)    APPearance CLEAR (*)    All other components within normal limits  LIPASE, BLOOD  CBC   ____________________________________________  EKG  ED ECG REPORT I, Delman Kitten, the attending physician, personally viewed and interpreted this ECG.  Date: 10/31/2019 EKG Time: 1155 Rate: 60 Rhythm:  normal sinus rhythm QRS Axis: normal Intervals: normal ST/T Wave abnormalities: normal Narrative Interpretation: no evidence of acute ischemia, single likely normal physiologic inversion V2  ____________________________________________  RADIOLOGY  Ct Abdomen Pelvis W Contrast  Result Date: 10/31/2019 CLINICAL DATA:  Abdominal pain, epigastric pain. EXAM: CT ABDOMEN AND PELVIS WITH CONTRAST TECHNIQUE: Multidetector CT imaging of the abdomen and pelvis was performed using the standard protocol following bolus administration of intravenous contrast. CONTRAST:  118mL OMNIPAQUE IOHEXOL 300 MG/ML  SOLN COMPARISON:  None. FINDINGS: Lower chest: Lung bases are clear. Heart is at the upper limits of normal in size to mildly enlarged. No pericardial or pleural effusion. Distal esophagus is unremarkable. Hepatobiliary: Subcentimeter low-attenuation lesion in the left hepatic lobe is too small to characterize. In the absence of known malignancy, a cyst is likely. Cholecystectomy. No biliary ductal dilatation. Pancreas: Negative. Spleen: Negative. Adrenals/Urinary  Tract: Adrenal glands are unremarkable. Subcentimeter low-attenuation lesion in the upper pole right kidney is too small to characterize but statistically, a cyst is likely. Ureters are decompressed. Bladder is unremarkable. Stomach/Bowel: Stomach, small bowel and appendix are unremarkable. Stool is seen throughout the colon. Vascular/Lymphatic: Vascular structures are unremarkable. No pathologically enlarged lymph nodes. Reproductive: Hysterectomy.  No adnexal mass. Other: No free fluid.  Mesenteries and peritoneum are unremarkable. Musculoskeletal: Mild facet hypertrophy. IMPRESSION: 1. No acute findings to explain the patient's pain. 2. Stool throughout the colon is indicative of constipation. Electronically Signed   By: Lorin Picket M.D.   On: 10/31/2019 16:59     Imaging studies reviewed negative for acute.  Possible constipation that ____________________________________________   PROCEDURES  Procedure(s) performed: None  Procedures  Critical Care performed: No  ____________________________________________   INITIAL IMPRESSION / ASSESSMENT AND PLAN / ED COURSE  Pertinent labs & imaging results that were available during my care of the patient were reviewed by me and considered in my medical decision making (see chart for details).   Differential diagnosis includes but is not limited to, abdominal perforation, aortic dissection, cholecystitis, appendicitis, diverticulitis, colitis, esophagitis/gastritis, kidney stone, pyelonephritis, urinary tract infection, aortic aneurysm. All are considered in decision and treatment plan. Based upon the patient's presentation and risk factors, and given the location and her previous cholecystectomy I am not quite certain as to cause.  Overall reassuring exam nontoxic, no peritonitis.  Discussed with the patient treatment options and imaging options, as this is it is somewhat new issue fairly bothersome to her and were not quite certain of the cause  we will proceed with CT scan to further evaluate.  Denies any acute pulmonary vascular or cardiac symptoms.  Will trial oral medications to see if this might provide some relief in the event this is gastritis, reflux etc.     ----------------------------------------- 5:40 PM on 10/31/2019 -----------------------------------------  Patient resting.  Reassured by her CT findings.  She reports she is suspicious about constipation follow-up drinking magnesium citrate this morning, decided that she would like to try doing that at home.  Very reassuring clinical exam, resting comfortably without distress.  Appears appropriate for ongoing outpatient care  Return precautions and treatment recommendations and follow-up discussed with the patient who is agreeable with the plan.   ____________________________________________   FINAL CLINICAL IMPRESSION(S) / ED DIAGNOSES  Final diagnoses:  Upper abdominal pain  Constipation, unspecified constipation type        Note:  This document was prepared using Dragon voice recognition software and may include unintentional dictation errors       Delman Kitten, MD  10/31/19 1740 ° °

## 2019-10-31 NOTE — ED Notes (Signed)
Called CT to notify of IV access. Pt had already finished contrast.

## 2019-10-31 NOTE — ED Triage Notes (Signed)
Reports epigastric pain that started last night at rest. Continues throughout today and is constant, worse with movement. Denies NVD. Denies CP or SOB. Pt alert and oriented X4, cooperative, RR even and unlabored, color WNL. Pt in NAD.

## 2019-10-31 NOTE — Telephone Encounter (Signed)
Contacted by Jerene Pitch at [facility] for nurse triage; the pt called with complaints of upper abdominal pain below her breast; the pain started around [place] 10/31/2019; her pain is constant, and rated 7-8 out of 10 now, but earlier it was 9-10 out of 10; she denies fever; "the area is sore if you press in it"; recommendations made per nurse triage protocol; she verbalized understanding; the pt says tht she will drive herself the ED because her husband recently had surgery; offered to call another driver,or EMS for pt but she declined; the pt sees Alma Friendly, [place]; will route to office for notification.  Reason for Disposition . [1] SEVERE pain (e.g., excruciating) AND [2] present > 1 hour  Answer Assessment - Initial Assessment Questions 1. LOCATION: "Where does it hurt?"      Upper abdomen below breast 2. RADIATION: "Does the pain shoot anywhere else?" (e.g., chest, back)    no 3. ONSET: "When did the pain begin?" (e.g., minutes, hours or days ago)      Around 0030 10/31/2019 4. SUDDEN: "Gradual or sudden onset?"    suddenly 5. PATTERN "Does the pain come and go, or is it constant?"    - If constant: "Is it getting better, staying the same, or worsening?"      (Note: Constant means the pain never goes away completely; most serious pain is constant and it progresses)     - If intermittent: "How long does it last?" "Do you have pain now?"     (Note: Intermittent means the pain goes away completely between bouts)   constant 6. SEVERITY: "How bad is the pain?"  (e.g., Scale 1-10; mild, moderate, or severe)   - MILD (1-3): doesn't interfere with normal activities, abdomen soft and not tender to touch    - MODERATE (4-7): interferes with normal activities or awakens from sleep, tender to touch    - SEVERE (8-10): excruciating pain, doubled over, unable to do any normal activities      Moderate to severe 7. RECURRENT SYMPTOM: "Have you ever had this type of abdominal pain before?"  If so, ask: "When was the last time?" and "What happened that time?"     Yes; pt had abdominal pain last week but she is not sure what day 8. CAUSE: "What do you think is causing the abdominal pain?"    Not sure 9. RELIEVING/AGGRAVATING FACTORS: "What makes it better or worse?" (e.g., movement, antacids, bowel movement)     Has not tried anything 10. OTHER SYMPTOMS: "Has there been any vomiting, diarrhea, constipation, or urine problems?"       no 11. PREGNANCY: "Is there any chance you are pregnant?" "When was your last menstrual period?"     No hysterectomy  Protocols used: ABDOMINAL PAIN - [facility]

## 2019-10-31 NOTE — Discharge Instructions (Signed)
? ?  Please return to the emergency room right away if you are to develop a fever, severe nausea, your pain becomes severe or worsens, you are unable to keep food down, begin vomiting any dark or bloody fluid, you develop any dark or bloody stools, feel dehydrated, or other new concerns or symptoms arise. ? ?

## 2019-10-31 NOTE — ED Notes (Signed)
Pt states upper abdominal pain started last night- pt denies taking any medication to help ease the pain- pt also stated that it is tender- pt denies n/v

## 2019-10-31 NOTE — ED Notes (Signed)
Pt given warm blanket.

## 2019-11-07 ENCOUNTER — Ambulatory Visit: Payer: Managed Care, Other (non HMO) | Admitting: Primary Care

## 2019-11-17 ENCOUNTER — Ambulatory Visit: Payer: Managed Care, Other (non HMO) | Admitting: Primary Care

## 2019-11-17 ENCOUNTER — Other Ambulatory Visit: Payer: Self-pay

## 2019-11-17 ENCOUNTER — Encounter: Payer: Self-pay | Admitting: Primary Care

## 2019-11-17 VITALS — BP 120/70 | HR 81 | Temp 97.4°F | Ht 65.0 in | Wt 171.8 lb

## 2019-11-17 DIAGNOSIS — N289 Disorder of kidney and ureter, unspecified: Secondary | ICD-10-CM

## 2019-11-17 DIAGNOSIS — N644 Mastodynia: Secondary | ICD-10-CM

## 2019-11-17 DIAGNOSIS — K5900 Constipation, unspecified: Secondary | ICD-10-CM

## 2019-11-17 DIAGNOSIS — R7989 Other specified abnormal findings of blood chemistry: Secondary | ICD-10-CM

## 2019-11-17 DIAGNOSIS — K769 Liver disease, unspecified: Secondary | ICD-10-CM

## 2019-11-17 NOTE — Progress Notes (Signed)
Subjective:    Patient ID: Samantha Hall, female    DOB: 1961/08/03, 58 y.o.   MRN: [id number]  HPI  Ms. Cloney is a 58 year old female with a history of hypertension, cholecystectomy, osteoarthritis, hyperlipidemia, chronic back pain, chronic shoulder pain, prediabetes who presents today for emergency department follow up and a chief complaint of breast pain.  She presented to [facility] ED on 10/31/19 with a chief complaint of abdominal pain. She endorsed sharp, upper, mid abdominal pain that occurred one week prior with resolve until 10/30/19. Some nausea, denied vomiting and diarrhea.   During her stay in the ED she underwent CT abdomen/pelvis which was unremarkable for symptoms, did note small likely liver and renal cyst. Exam was without obvious cause for symptoms, reassuring exam. Labs were grossly unremarkable. She was discharged home later that day.  Since her ED visit she continues to have intermittent mid upper abdominal pain. She's been using colon cleanses and detox tea which has helped somewhat, feels that bowels are moving slightly. Also started probiotics.   She's been checking her BP at home and recent readings have been 120-130's/60's-70's, higher readings of 150's-160's/90's with increased stress at home. She is compliant to her HCTZ 12.5 mg and Amlodipine 5 mg daily.  She's having left sided shooting breast pain from the top or her breast down to the bottom breast. Today she's feeling tender, denies lumps, changes in skin texture. Symptoms began a few weeks ago. Her last mammogram was in February 2020.  BP Readings from Last 3 Encounters:  11/17/19 120/70  10/31/19 (!) 117/97  03/11/19 114/70     Review of Systems  Gastrointestinal: Positive for abdominal pain.       See HPI  Genitourinary:       Breast pain  Skin: Negative for color change and wound.       Past Medical History:  Diagnosis Date  . Elevated antinuclear antibody (ANA) level    Negative workup  for Lupus.  . Fracture of left patella 04/30/2018  . Hyperlipidemia   . Hypertension    Controlled  . Osteoarthritis   . Sciatica   . Vitamin D deficiency      Social History   Socioeconomic History  . Marital status: Married    Spouse name: Not on file  . Number of children: Not on file  . Years of education: Not on file  . Highest education level: Not on file  Occupational History  . Not on file  Social Needs  . Financial resource strain: Not on file  . Food insecurity    Worry: Not on file    Inability: Not on file  . Transportation needs    Medical: Not on file    Non-medical: Not on file  Tobacco Use  . Smoking status: Never Smoker  . Smokeless tobacco: Never Used  Substance and Sexual Activity  . Alcohol use: Yes    Alcohol/week: 0.0 standard drinks    Comment: sociall  . Drug use: No  . Sexual activity: Not on file  Lifestyle  . Physical activity    Days per week: Not on file    Minutes per session: Not on file  . Stress: Not on file  Relationships  . Social Herbalist on phone: Not on file    Gets together: Not on file    Attends religious service: Not on file    Active member of club or organization: Not on file  Attends meetings of clubs or organizations: Not on file    Relationship status: Not on file  . Intimate partner violence    Fear of current or ex partner: Not on file    Emotionally abused: Not on file    Physically abused: Not on file    Forced sexual activity: Not on file  Other Topics Concern  . Not on file  Social History Narrative   Married.   Highest level of education Bachelors.    Works as a Education officer, museum.    Past Surgical History:  Procedure Laterality Date  . ABDOMINAL HYSTERECTOMY  2002  . CHOLECYSTECTOMY  2015  . TUBAL LIGATION  1984    No family history on file.  Allergies  Allergen Reactions  . Cefazolin Swelling and Anaphylaxis    Difficulty breathing  . Sulfa Antibiotics Rash    Current  Outpatient Medications on File Prior to Visit  Medication Sig Dispense Refill  . amLODipine (NORVASC) 5 MG tablet TAKE 1 TABLET BY MOUTH EVERY DAY 90 tablet 1  . aspirin EC 81 MG tablet Take 81 mg by mouth daily.    Marland Kitchen b complex vitamins tablet Take 1 tablet by mouth daily.    . Cholecalciferol (VITAMIN D) 2000 units CAPS Take by mouth.    . cyclobenzaprine (FLEXERIL) 10 MG tablet Take 1 tablet (10 mg total) by mouth 3 (three) times daily as needed for muscle spasms. 30 tablet 0  . diclofenac (VOLTAREN) 75 MG EC tablet Take 1 tablet (75 mg total) by mouth 2 (two) times daily as needed. For inflammation/pain. 60 tablet 0  . hydrochlorothiazide (MICROZIDE) 12.5 MG capsule Take 12.5 mg by mouth daily.    . Multiple Vitamin (MULTIVITAMIN) tablet Take 1 tablet by mouth daily.    Marland Kitchen triamcinolone cream (KENALOG) 0.5 % APPLY TO AFFECTED AREA TWICE A DAY 30 g 0  . TURMERIC PO Take 4 capsules by mouth daily.    . hydrOXYzine (ATARAX/VISTARIL) 10 MG tablet TAKE 1 CAPSULE BY MOUTH UP TO TWICE DAILY AS NEEDED FOR ANXIETY. (Patient not taking: Reported on 03/31/2019) 30 tablet 0   No current facility-administered medications on file prior to visit.     BP 120/70   Pulse 81   Temp (!) 97.4 F (36.3 C) (Temporal)   Ht 5\' 5"  (1.651 m)   Wt 171 lb 12 oz (77.9 kg)   SpO2 98%   BMI 28.58 kg/m    Objective:   Physical Exam  Constitutional: She appears well-nourished.  Cardiovascular: Normal rate and regular rhythm.  Respiratory: Effort normal.    Non tender to bilateral breasts upon palpation.  No lumps/masses. No skin texture abnormalities.   Skin: Skin is warm and dry. No rash noted. No erythema.           Assessment & Plan:

## 2019-11-17 NOTE — Assessment & Plan Note (Signed)
Incidental finding on CT scan from November 2020. Too small to characterize. Consider re-imaging in 6 months. LFT's WNL.

## 2019-11-17 NOTE — Patient Instructions (Signed)
You will be contacted regarding your mammogram and ultrasounds.  Please let us know if you have not been contacted within a few days.  Continue the probiotics. Ensure you are consuming 64 ounces of water daily.  Make sure to eat plenty of fiber in your diet for constipation.  It was a pleasure to see you today!      High-Fiber Diet Fiber, also called dietary fiber, is a type of carbohydrate that is found in fruits, vegetables, whole grains, and beans. A high-fiber diet can have many health benefits. Your health care provider may recommend a high-fiber diet to help:  Prevent constipation. Fiber can make your bowel movements more regular.  Lower your cholesterol.  Relieve the following conditions: ? Swelling of veins in the anus (hemorrhoids). ? Swelling and irritation (inflammation) of specific areas of the digestive tract (uncomplicated diverticulosis). ? A problem of the large intestine (colon) that sometimes causes pain and diarrhea (irritable bowel syndrome, IBS).  Prevent overeating as part of a weight-loss plan.  Prevent heart disease, type 2 diabetes, and certain cancers. What is my plan? The recommended daily fiber intake in grams (g) includes:  38 g for men age 60 or younger.  30 g for men over age 67.  87 g for women age 3 or younger.  21 g for women over age 85. You can get the recommended daily intake of dietary fiber by:  Eating a variety of fruits, vegetables, grains, and beans.  Taking a fiber supplement, if it is not possible to get enough fiber through your diet. What do I need to know about a high-fiber diet?  It is better to get fiber through food sources rather than from fiber supplements. There is not a lot of research about how effective supplements are.  Always check the fiber content on the nutrition facts label of any prepackaged food. Look for foods that contain 5 g of fiber or more per serving.  Talk with a diet and nutrition specialist  (dietitian) if you have questions about specific foods that are recommended or not recommended for your medical condition, especially if those foods are not listed below.  Gradually increase how much fiber you consume. If you increase your intake of dietary fiber too quickly, you may have bloating, cramping, or gas.  Drink plenty of water. Water helps you to digest fiber. What are tips for following this plan?  Eat a wide variety of high-fiber foods.  Make sure that half of the grains that you eat each day are whole grains.  Eat breads and cereals that are made with whole-grain flour instead of refined flour or white flour.  Eat brown rice, bulgur wheat, or millet instead of white rice.  Start the day with a breakfast that is high in fiber, such as a cereal that contains 5 g of fiber or more per serving.  Use beans in place of meat in soups, salads, and pasta dishes.  Eat high-fiber snacks, such as berries, raw vegetables, nuts, and popcorn.  Choose whole fruits and vegetables instead of processed forms like juice or sauce. What foods can I eat?  Fruits Berries. Pears. Apples. Oranges. Avocado. Prunes and raisins. Dried figs. Vegetables Sweet potatoes. Spinach. Kale. Artichokes. Cabbage. Broccoli. Cauliflower. Green peas. Carrots. Squash. Grains Whole-grain breads. Multigrain cereal. Oats and oatmeal. Brown rice. Barley. Bulgur wheat. [place]. Quinoa. Bran muffins. Popcorn. Rye wafer crackers. Meats and other proteins Navy, kidney, and pinto beans. Soybeans. Split peas. Lentils. Nuts and seeds. Dairy Fiber-fortified  yogurt. Beverages Fiber-fortified soy milk. Fiber-fortified orange juice. Other foods Fiber bars. The items listed above may not be a complete list of recommended foods and beverages. Contact a dietitian for more options. What foods are not recommended? Fruits Fruit juice. Cooked, strained fruit. Vegetables Fried potatoes. Canned vegetables. Well-cooked  vegetables. Grains White bread. Pasta made with refined flour. White rice. Meats and other proteins Fatty cuts of meat. Fried chicken or fried fish. Dairy Milk. Yogurt. Cream cheese. Sour cream. Fats and oils Butters. Beverages Soft drinks. Other foods Cakes and pastries. The items listed above may not be a complete list of foods and beverages to avoid. Contact a dietitian for more information. Summary  Fiber is a type of carbohydrate. It is found in fruits, vegetables, whole grains, and beans.  There are many health benefits of eating a high-fiber diet, such as preventing constipation, lowering blood cholesterol, helping with weight loss, and reducing your risk of heart disease, diabetes, and certain cancers.  Gradually increase your intake of fiber. Increasing too fast can result in cramping, bloating, and gas. Drink plenty of water while you increase your fiber.  The best sources of fiber include whole fruits and vegetables, whole grains, nuts, seeds, and beans. This information is not intended to replace advice given to you by your health care provider. Make sure you discuss any questions you have with your health care provider. Document Released: 12/04/2005 Document Revised: 10/08/2017 Document Reviewed: 10/08/2017 Elsevier Patient Education  2020 [employer].

## 2019-11-17 NOTE — Assessment & Plan Note (Signed)
Resolved. Normal liver enzyme counts on labs from November 2020. Continue to monitor.

## 2019-11-17 NOTE — Assessment & Plan Note (Signed)
Exam today benign. Diagnostic mammogram with bilateral ultrasounds pending for December 2020.

## 2019-11-17 NOTE — Assessment & Plan Note (Signed)
Incidental finding on CT scan from November 2020. Consider ultrasound in 6 months for evaluation. Renal function unremarkable.

## 2019-11-17 NOTE — Assessment & Plan Note (Signed)
Noted on CT scan during ED visit in November 2020. Exam today stable. Encouraged probiotics, water intake, exercise. Continue to monitor.   ED notes, labs, imaging reviewed.

## 2019-11-27 ENCOUNTER — Ambulatory Visit
Admission: RE | Admit: 2019-11-27 | Discharge: 2019-11-27 | Disposition: A | Discharge status: Home / Self Care | Payer: Managed Care, Other (non HMO) | Source: Ambulatory Visit | Attending: Primary Care | Admitting: Primary Care

## 2019-11-27 ENCOUNTER — Other Ambulatory Visit: Payer: Self-pay

## 2019-11-27 ENCOUNTER — Ambulatory Visit: Payer: Managed Care, Other (non HMO)

## 2019-11-27 ENCOUNTER — Other Ambulatory Visit: Payer: Self-pay | Admitting: Primary Care

## 2019-11-27 DIAGNOSIS — N644 Mastodynia: Secondary | ICD-10-CM

## 2019-12-03 ENCOUNTER — Ambulatory Visit: Payer: Managed Care, Other (non HMO) | Admitting: Primary Care

## 2019-12-04 ENCOUNTER — Other Ambulatory Visit: Payer: Self-pay

## 2019-12-04 ENCOUNTER — Ambulatory Visit: Payer: Managed Care, Other (non HMO) | Admitting: Primary Care

## 2019-12-04 ENCOUNTER — Ambulatory Visit: Payer: Managed Care, Other (non HMO) | Attending: Internal Medicine

## 2019-12-04 DIAGNOSIS — Z20822 Contact with and (suspected) exposure to covid-19: Secondary | ICD-10-CM

## 2019-12-05 ENCOUNTER — Ambulatory Visit: Payer: Managed Care, Other (non HMO) | Admitting: Primary Care

## 2019-12-05 LAB — NOVEL CORONAVIRUS, NAA: SARS-CoV-2, NAA: NOT DETECTED

## 2019-12-15 ENCOUNTER — Other Ambulatory Visit: Payer: Self-pay

## 2019-12-15 ENCOUNTER — Ambulatory Visit: Payer: Managed Care, Other (non HMO) | Admitting: Internal Medicine

## 2019-12-15 ENCOUNTER — Encounter: Payer: Self-pay | Admitting: Internal Medicine

## 2019-12-15 VITALS — BP 122/78 | HR 66 | Temp 97.8°F | Wt 174.0 lb

## 2019-12-15 DIAGNOSIS — N949 Unspecified condition associated with female genital organs and menstrual cycle: Secondary | ICD-10-CM | POA: Diagnosis not present

## 2019-12-15 DIAGNOSIS — N898 Other specified noninflammatory disorders of vagina: Secondary | ICD-10-CM

## 2019-12-15 NOTE — Progress Notes (Signed)
Subjective:    Patient ID: Samantha Hall, female    DOB: 10-09-1961, 58 y.o.   MRN: [id number]  HPI  Pt presents to the clinic today with c/o vaginal burning and itching. This started 3-4 weeks ago. She has noticed some white discharge. She denies vaginal swelling, abnormal bleeding or odor. She denies pelvic pain, low back pain, nausea, vomiting, fever or chills. She denies urinary frequency, urgency, dysuria or blood in her urine. She has tried Glass blower/designer with minimal relief. She is sexually active, but has no concerns about STD.  Review of Systems  Past Medical History:  Diagnosis Date  . Elevated antinuclear antibody (ANA) level    Negative workup for Lupus.  . Fracture of left patella 04/30/2018  . Hyperlipidemia   . Hypertension    Controlled  . Osteoarthritis   . Sciatica   . Vitamin D deficiency     Current Outpatient Medications  Medication Sig Dispense Refill  . amLODipine (NORVASC) 5 MG tablet TAKE 1 TABLET BY MOUTH EVERY DAY 90 tablet 1  . aspirin EC 81 MG tablet Take 81 mg by mouth daily.    Marland Kitchen b complex vitamins tablet Take 1 tablet by mouth daily.    . Cholecalciferol (VITAMIN D) 2000 units CAPS Take by mouth.    . cyclobenzaprine (FLEXERIL) 10 MG tablet Take 1 tablet (10 mg total) by mouth 3 (three) times daily as needed for muscle spasms. 30 tablet 0  . diclofenac (VOLTAREN) 75 MG EC tablet Take 1 tablet (75 mg total) by mouth 2 (two) times daily as needed. For inflammation/pain. 60 tablet 0  . hydrochlorothiazide (MICROZIDE) 12.5 MG capsule Take 12.5 mg by mouth daily.    . hydrOXYzine (ATARAX/VISTARIL) 10 MG tablet TAKE 1 CAPSULE BY MOUTH UP TO TWICE DAILY AS NEEDED FOR ANXIETY. (Patient not taking: Reported on 03/31/2019) 30 tablet 0  . Multiple Vitamin (MULTIVITAMIN) tablet Take 1 tablet by mouth daily.    Marland Kitchen triamcinolone cream (KENALOG) 0.5 % APPLY TO AFFECTED AREA TWICE A DAY 30 g 0  . TURMERIC PO Take 4 capsules by mouth daily.     No current  facility-administered medications for this visit.    Allergies  Allergen Reactions  . Cefazolin Swelling and Anaphylaxis    Difficulty breathing  . Sulfa Antibiotics Rash    No family history on file.  Social History   Socioeconomic History  . Marital status: Married    Spouse name: Not on file  . Number of children: Not on file  . Years of education: Not on file  . Highest education level: Not on file  Occupational History  . Not on file  Tobacco Use  . Smoking status: Never Smoker  . Smokeless tobacco: Never Used  Substance and Sexual Activity  . Alcohol use: Yes    Alcohol/week: 0.0 standard drinks    Comment: sociall  . Drug use: No  . Sexual activity: Not on file  Other Topics Concern  . Not on file  Social History Narrative   Married.   Highest level of education Bachelors.    Works as a Education officer, museum.   Social Determinants of Health   Financial Resource Strain:   . Difficulty of Paying Living Expenses: Not on file  Food Insecurity:   . Worried About Charity fundraiser in the Last Year: Not on file  . Ran Out of Food in the Last Year: Not on file  Transportation Needs:   . Lack of  Transportation (Medical): Not on file  . Lack of Transportation (Non-Medical): Not on file  Physical Activity:   . Days of Exercise per Week: Not on file  . Minutes of Exercise per Session: Not on file  Stress:   . Feeling of Stress : Not on file  Social Connections:   . Frequency of Communication with Friends and Family: Not on file  . Frequency of Social Gatherings with Friends and Family: Not on file  . Attends Religious Services: Not on file  . Active Member of Clubs or Organizations: Not on file  . Attends Archivist Meetings: Not on file  . Marital Status: Not on file  Intimate Partner Violence:   . Fear of Current or Ex-Partner: Not on file  . Emotionally Abused: Not on file  . Physically Abused: Not on file  . Sexually Abused: Not on file      Constitutional: Denies fever, malaise, fatigue, headache or abrupt weight changes.  Respiratory: Denies difficulty breathing, shortness of breath, cough or sputum production.   Cardiovascular: Denies chest pain, chest tightness, palpitations or swelling in the hands or feet.  Gastrointestinal: Denies abdominal pain, bloating, constipation, diarrhea or blood in the stool.  GU: Pt reports vaginal itching, discharge and burning. Denies urgency, frequency, pain with urination, burning sensation, blood in urine, odor. Skin: Denies redness, rashes, lesions or ulcercations.   No other specific complaints in a complete review of systems (except as listed in HPI above).     Objective:   Physical Exam    Wt Readings from Last 3 Encounters:  11/17/19 171 lb 12 oz (77.9 kg)  10/31/19 168 lb (76.2 kg)  03/11/19 167 lb 8 oz (76 kg)    General: Appears her stated age, well developed, well nourished in NAD. Cardiovascular: Normal rate and rhythm.  Pulmonary/Chest: Normal effort and positive vesicular breath sounds. No respiratory distress. No wheezes, rales or ronchi noted.  Abdomen: Soft and nontender. Normal bowel sounds. No distention or masses noted.  Pelvic: Self swabbed. Neurological: Alert and oriented.   BMET    Component Value Date/Time   NA 137 10/31/2019 1152   K 3.5 10/31/2019 1152   CL 100 10/31/2019 1152   CO2 27 10/31/2019 1152   GLUCOSE 101 (H) 10/31/2019 1152   BUN 18 10/31/2019 1152   CREATININE 0.89 10/31/2019 1152   CALCIUM 9.3 10/31/2019 1152   GFRNONAA >60 10/31/2019 1152   GFRAA >60 10/31/2019 1152    Lipid Panel     Component Value Date/Time   CHOL 222 (H) 01/31/2017 0925   TRIG 48.0 01/31/2017 0925   HDL 81.10 01/31/2017 0925   CHOLHDL 3 01/31/2017 0925   VLDL 9.6 01/31/2017 0925   LDLCALC 131 (H) 01/31/2017 0925    CBC    Component Value Date/Time   WBC 5.1 10/31/2019 1152   RBC 4.63 10/31/2019 1152   HGB 13.8 10/31/2019 1152   HCT 40.1  10/31/2019 1152   PLT 294 10/31/2019 1152   MCV 86.6 10/31/2019 1152   MCH 29.8 10/31/2019 1152   MCHC 34.4 10/31/2019 1152   RDW 12.8 10/31/2019 1152   LYMPHSABS 2.4 08/16/2017 1221   MONOABS 0.5 08/16/2017 1221   EOSABS 0.1 08/16/2017 1221   BASOSABS 0.0 08/16/2017 1221    Hgb A1C Lab Results  Component Value Date   HGBA1C 6.4 03/11/2019          Assessment & Plan:   Vaginal Discharge, Vaginal Burning, Vaginal Itching:  Will obtain wet  prep to check for BV, yeast and trich She declines STD screening at this time  Will follow up after labs, return precautions discussed Webb Silversmith, NP This visit occurred during the SARS-CoV-2 public health emergency.  Safety protocols were in place, including screening questions prior to the visit, additional usage of staff PPE, and extensive cleaning of exam room while observing appropriate contact time as indicated for disinfecting solutions.

## 2019-12-15 NOTE — Patient Instructions (Signed)
Vaginitis  Vaginitis is irritation and swelling (inflammation) of the vagina. It happens when normal bacteria and yeast in the vagina grow too much. There are many types of this condition. Treatment will depend on the type you have. Follow these instructions at home: Lifestyle  Keep your vagina area clean and dry. ? Avoid using soap. ? Rinse the area with water.  Do not do the following until your doctor says it is okay: ? Wash and clean out the vagina (douche). ? Use tampons. ? Have sex.  Wipe from front to back after going to the bathroom.  Let air reach your vagina. ? Wear cotton underwear. ? Do not wear: ? Underwear while you sleep. ? Tight pants. ? Thong underwear. ? Underwear or nylons without a cotton panel. ? Take off any wet clothing, such as bathing suits, as soon as possible.  Use gentle, non-scented products. Do not use things that can irritate the vagina, such as fabric softeners. Avoid the following products if they are scented: ? Feminine sprays. ? Detergents. ? Tampons. ? Feminine hygiene products. ? Soaps or bubble baths.  Practice safe sex and use condoms. General instructions  Take over-the-counter and prescription medicines only as told by your doctor.  If you were prescribed an antibiotic medicine, take or use it as told by your doctor. Do not stop taking or using the antibiotic even if you start to feel better.  Keep all follow-up visits as told by your doctor. This is important. Contact a doctor if:  You have pain in your belly.  You have a fever.  Your symptoms last for more than 2-3 days. Get help right away if:  You have a fever and your symptoms get worse all of a sudden. Summary  Vaginitis is irritation and swelling of the vagina. It can happen when the normal bacteria and yeast in the vagina grow too much. There are many types.  Treatment will depend on the type you have.  Do not douche, use tampons , or have sex until your health  care provider approves. When you can return to sex, practice safe sex and use condoms. This information is not intended to replace advice given to you by your health care provider. Make sure you discuss any questions you have with your health care provider. Document Released: 03/02/2009 Document Revised: 11/16/2017 Document Reviewed: 12/26/2016 Elsevier Patient Education  2020 [employer].

## 2019-12-15 NOTE — Addendum Note (Signed)
Addended by: Lurlean Nanny on: 12/15/2019 04:42 PM   Modules accepted: Orders

## 2019-12-16 ENCOUNTER — Other Ambulatory Visit (INDEPENDENT_AMBULATORY_CARE_PROVIDER_SITE_OTHER): Payer: Managed Care, Other (non HMO)

## 2019-12-16 ENCOUNTER — Encounter: Payer: Self-pay | Admitting: Internal Medicine

## 2019-12-16 DIAGNOSIS — Z113 Encounter for screening for infections with a predominantly sexual mode of transmission: Secondary | ICD-10-CM

## 2019-12-16 LAB — WET PREP BY MOLECULAR PROBE
Candida species: NOT DETECTED
Gardnerella vaginalis: NOT DETECTED
MICRO NUMBER:: 1232882
SPECIMEN QUALITY:: ADEQUATE
Trichomonas vaginosis: NOT DETECTED

## 2019-12-16 NOTE — Addendum Note (Signed)
Addended by: Cloyd Stagers on: 12/16/2019 12:49 PM   Modules accepted: Orders

## 2019-12-17 LAB — HSV(HERPES SIMPLEX VRS) I + II AB-IGG
HAV 1 IGG,TYPE SPECIFIC AB: 25.8 index — ABNORMAL HIGH
HSV 2 IGG,TYPE SPECIFIC AB: >23 index — ABNORMAL HIGH

## 2019-12-18 NOTE — Telephone Encounter (Signed)
This was routed to me, are you working on something with her?

## 2019-12-19 LAB — HSV 1/2 AB (IGM), IFA W/RFLX TITER
HSV 1 IgM Screen: NEGATIVE
HSV 2 IgM Screen: NEGATIVE

## 2019-12-19 LAB — C. TRACHOMATIS/N. GONORRHOEAE RNA
C. trachomatis RNA, TMA: NOT DETECTED
N. gonorrhoeae RNA, TMA: NOT DETECTED

## 2019-12-19 LAB — HEPATITIS C ANTIBODY
Hepatitis C Ab: NONREACTIVE
SIGNAL TO CUT-OFF: 0.01 (ref <1.00)

## 2019-12-19 LAB — HIV ANTIBODY (ROUTINE TESTING W REFLEX): HIV 1&2 Ab, 4th Generation: NONREACTIVE

## 2019-12-19 LAB — RPR: RPR Ser Ql: NONREACTIVE

## 2019-12-22 ENCOUNTER — Ambulatory Visit: Payer: Managed Care, Other (non HMO) | Admitting: Primary Care

## 2019-12-22 ENCOUNTER — Other Ambulatory Visit: Payer: Self-pay

## 2019-12-22 ENCOUNTER — Encounter: Payer: Self-pay | Admitting: Primary Care

## 2019-12-22 VITALS — BP 120/76 | HR 66 | Temp 97.2°F | Ht 65.0 in | Wt 171.5 lb

## 2019-12-22 DIAGNOSIS — N898 Other specified noninflammatory disorders of vagina: Secondary | ICD-10-CM | POA: Insufficient documentation

## 2019-12-22 NOTE — Patient Instructions (Signed)
You will be contacted regarding your referral to GYN.  Please let us know if you have not been contacted within two weeks.   It was a pleasure to see you today!

## 2019-12-22 NOTE — Assessment & Plan Note (Signed)
Also with vaginal irritation. Work up grossly unremarkable on 12/16/19, no significant improvement with Valtrex. Based off of labs she wasn't having an acute outbreak of HSV.  She could be experiencing symptoms secondary to vaginal atrophy, discussed this today. Also consider lichen sclerosis given that triamcinolone has helped.  Referral placed to GYN for evaluation given ongoing symptoms.

## 2019-12-22 NOTE — Progress Notes (Signed)
Subjective:    Patient ID: Samantha Hall, female    DOB: 16-Oct-1961, 59 y.o.   MRN: [id number]  HPI   This visit occurred during the SARS-CoV-2 public health emergency.  Safety protocols were in place, including screening questions prior to the visit, additional usage of staff PPE, and extensive cleaning of exam room while observing appropriate contact time as indicated for disinfecting solutions.   [name] is a 59 year old female with a history of hypertension, osteoarthritis, chronic low back pain, liver lesion, situational anxiety who presents today   She was evaluated by Webb Silversmith on 12/16/19 for complaints of vaginal burning and itching that began 3-4 weeks prior. Work up included [facility] which was negative. She also completed STD testing the following day which was negative except for evidence of HSV I and II.   She is here to discuss lab results from her visit on 12/16/19 and ongoing vaginal discharge/irritation. She would like clarification of the HSV labs as she has no recollection of this diagnosis.   She was evaluated one year ago at her occupation's clinic for symptoms of vaginal discharge and itching, was prescribed Valtrex at the time but was not sure if this was really helpful. She's had these bouts of symptoms intermittently since then, has not used Valtrex until recently.  She's been taking Valtrex for the last three days (refill of her older prescription) with some improvement except for vaginal discharge. She was using Vagisil prior which she thinks caused vaginal irritation so she then started using triamcinolone with improvement.   She underwent hysterectomy at the age of 47 due to fibroids causing significant anemia. She's not sure if her cervix remains, thinks she still has her ovaries.   Review of Systems  Constitutional: Negative for fever.  Genitourinary: Positive for vaginal discharge.       See HPI       Past Medical History:  Diagnosis Date  .  Elevated antinuclear antibody (ANA) level    Negative workup for Lupus.  . Fibroids   . Fracture of left patella 04/30/2018  . Hyperlipidemia   . Hypertension    Controlled  . Osteoarthritis   . Sciatica   . Vitamin D deficiency      Social History   Socioeconomic History  . Marital status: Married    Spouse name: Not on file  . Number of children: Not on file  . Years of education: Not on file  . Highest education level: Not on file  Occupational History  . Not on file  Tobacco Use  . Smoking status: Never Smoker  . Smokeless tobacco: Never Used  Substance and Sexual Activity  . Alcohol use: Yes    Alcohol/week: 0.0 standard drinks    Comment: sociall  . Drug use: No  . Sexual activity: Not on file  Other Topics Concern  . Not on file  Social History Narrative   Married.   Highest level of education Bachelors.    Works as a Education officer, museum.   Social Determinants of Health   Financial Resource Strain:   . Difficulty of Paying Living Expenses: Not on file  Food Insecurity:   . Worried About Charity fundraiser in the Last Year: Not on file  . Ran Out of Food in the Last Year: Not on file  Transportation Needs:   . Lack of Transportation (Medical): Not on file  . Lack of Transportation (Non-Medical): Not on file  Physical Activity:   .  Days of Exercise per Week: Not on file  . Minutes of Exercise per Session: Not on file  Stress:   . Feeling of Stress : Not on file  Social Connections:   . Frequency of Communication with Friends and Family: Not on file  . Frequency of Social Gatherings with Friends and Family: Not on file  . Attends Religious Services: Not on file  . Active Member of Clubs or Organizations: Not on file  . Attends Archivist Meetings: Not on file  . Marital Status: Not on file  Intimate Partner Violence:   . Fear of Current or Ex-Partner: Not on file  . Emotionally Abused: Not on file  . Physically Abused: Not on file  . Sexually  Abused: Not on file    Past Surgical History:  Procedure Laterality Date  . ABDOMINAL HYSTERECTOMY  2002  . CHOLECYSTECTOMY  2015  . TUBAL LIGATION  1984    No family history on file.  Allergies  Allergen Reactions  . Cefazolin Swelling and Anaphylaxis    Difficulty breathing  . Sulfa Antibiotics Rash    Current Outpatient Medications on File Prior to Visit  Medication Sig Dispense Refill  . amLODipine (NORVASC) 5 MG tablet TAKE 1 TABLET BY MOUTH EVERY DAY 90 tablet 1  . aspirin EC 81 MG tablet Take 81 mg by mouth daily.    Marland Kitchen b complex vitamins tablet Take 1 tablet by mouth daily.    . Cholecalciferol (VITAMIN D) 2000 units CAPS Take by mouth.    . cyclobenzaprine (FLEXERIL) 10 MG tablet Take 1 tablet (10 mg total) by mouth 3 (three) times daily as needed for muscle spasms. 30 tablet 0  . diclofenac (VOLTAREN) 75 MG EC tablet Take 1 tablet (75 mg total) by mouth 2 (two) times daily as needed. For inflammation/pain. 60 tablet 0  . hydrochlorothiazide (MICROZIDE) 12.5 MG capsule Take 12.5 mg by mouth daily.    . hydrOXYzine (ATARAX/VISTARIL) 10 MG tablet TAKE 1 CAPSULE BY MOUTH UP TO TWICE DAILY AS NEEDED FOR ANXIETY. 30 tablet 0  . Multiple Vitamin (MULTIVITAMIN) tablet Take 1 tablet by mouth daily.    Marland Kitchen triamcinolone cream (KENALOG) 0.5 % APPLY TO AFFECTED AREA TWICE A DAY 30 g 0  . TURMERIC PO Take 4 capsules by mouth daily.     No current facility-administered medications on file prior to visit.    BP 120/76   Pulse 66   Temp (!) 97.2 F (36.2 C) (Temporal)   Ht 5\' 5"  (1.651 m)   Wt 171 lb 8 oz (77.8 kg)   SpO2 97%   BMI 28.54 kg/m    Objective:   Physical Exam  Constitutional: She is oriented to person, place, and time. She appears well-nourished.  Respiratory: Effort normal.  Neurological: She is alert and oriented to person, place, and time.  Psychiatric: She has a normal mood and affect.           Assessment & Plan:

## 2019-12-23 ENCOUNTER — Telehealth: Payer: Self-pay | Admitting: Obstetrics and Gynecology

## 2019-12-23 DIAGNOSIS — K769 Liver disease, unspecified: Secondary | ICD-10-CM

## 2019-12-23 NOTE — Telephone Encounter (Signed)
[facility] referring for chronic recurrent vaginal discharge causing vaginal irritation/burning/itching. Testing negative during PCP visit. Please set up with Alicia Copland.

## 2019-12-27 ENCOUNTER — Other Ambulatory Visit: Payer: Self-pay | Admitting: Primary Care

## 2019-12-27 DIAGNOSIS — M5441 Lumbago with sciatica, right side: Secondary | ICD-10-CM

## 2019-12-27 DIAGNOSIS — G8929 Other chronic pain: Secondary | ICD-10-CM

## 2019-12-27 DIAGNOSIS — R21 Rash and other nonspecific skin eruption: Secondary | ICD-10-CM

## 2019-12-29 NOTE — Telephone Encounter (Signed)
Last prescribed on 08/06/2019 . Last appointment on 12/22/2019. No future appointment

## 2019-12-30 ENCOUNTER — Encounter: Payer: Self-pay | Admitting: Obstetrics and Gynecology

## 2019-12-30 ENCOUNTER — Other Ambulatory Visit: Payer: Self-pay

## 2019-12-30 ENCOUNTER — Ambulatory Visit (INDEPENDENT_AMBULATORY_CARE_PROVIDER_SITE_OTHER): Payer: Managed Care, Other (non HMO) | Admitting: Obstetrics and Gynecology

## 2019-12-30 VITALS — BP 110/60 | Ht 65.0 in | Wt 168.0 lb

## 2019-12-30 DIAGNOSIS — N898 Other specified noninflammatory disorders of vagina: Secondary | ICD-10-CM

## 2019-12-30 LAB — POCT WET PREP WITH KOH
Clue Cells Wet Prep HPF POC: NEGATIVE
KOH Prep POC: NEGATIVE
Trichomonas, UA: NEGATIVE
Yeast Wet Prep HPF POC: NEGATIVE

## 2019-12-30 NOTE — Telephone Encounter (Signed)
What's she using this for? I initially prescribed it in early 2020 for an acute visit.

## 2019-12-30 NOTE — Patient Instructions (Signed)
I value your feedback and entrusting us with your care. If you get a [facility] patient survey, I would appreciate you taking the time to let us know about your experience today. Thank you!  As of November 27, 2019, your lab results will be released to your MyChart immediately, before I even have a chance to see them. Please give me time to review them and contact you if there are any abnormalities. Thank you for your patience.  

## 2019-12-30 NOTE — Progress Notes (Signed)
Pleas Koch, NP   Chief Complaint  Patient presents with  . Vaginal Discharge    itchiness and discharges irritates skin, no odor x 1 month    HPI:      Ms. Kirstan Papka is a 59 y.o. No obstetric history on file. who LMP was No LMP recorded. Patient has had a hysterectomy., presents today for NP eval of vaginal d/c, irritation (no burning), no odor, since 11/20, referred by PCP. 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 No meds to treat since neg eval. 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 Tried treating with valtrex without sx change, so stopped it. Using triamcinolone crm to treat itch with some relief. Usually doesn't have d/c but having to wear cotton underwear/unscented pantyliners due to amt of d/c. Has had same sx multiple times over the yrs that resolves on its own. Pt wants to have dx. Uses oil of olay soap, line dries underwear but uses scented detergent. Hx of sensitive skin in general. S/p TAH due to leio/AUB. No vag bleeding/PMB. No urin sx, LBP, fevers. Gets occas pelvic discomfort. Just started probiotics/eating yogurt.  She is sex active, using lubricants with dryness improvement.    Patient Active Problem List   Diagnosis Date Noted  . Vaginal discharge 12/22/2019  . Liver lesion, left lobe 11/17/2019  . Kidney lesion, native, right 11/17/2019  . Breast pain 11/17/2019  . Constipation 11/17/2019  . Rash and nonspecific skin eruption 03/31/2019  . Numbness and tingling 03/11/2019  . Pain of upper abdomen 12/27/2018  . Bilateral hand pain 12/27/2018  . Chronic shoulder pain 06/10/2018  . Elevated LFTs 06/10/2018  . Prediabetes 06/10/2018  . Knee pain, bilateral 04/29/2018  . Situational anxiety 12/05/2017  . Chronic low back pain 10/31/2017  . Osteoarthritis 02/02/2017  . Preventative health care 02/02/2017  . Hyperlipidemia 02/02/2017  . Essential hypertension 11/22/2015    Past  Surgical History:  Procedure Laterality Date  . ABDOMINAL HYSTERECTOMY  2002  . CHOLECYSTECTOMY  2015  . TUBAL LIGATION  1984    History reviewed. No pertinent family history.  Social History   Socioeconomic History  . Marital status: Married    Spouse name: Not on file  . Number of children: Not on file  . Years of education: Not on file  . Highest education level: Not on file  Occupational History  . Not on file  Tobacco Use  . Smoking status: Never Smoker  . Smokeless tobacco: Never Used  Substance and Sexual Activity  . Alcohol use: Yes    Alcohol/week: 0.0 standard drinks    Comment: sociall  . Drug use: No  . Sexual activity: Yes    Birth control/protection: Surgical    Comment: Hysterectomy  Other Topics Concern  . Not on file  Social History Narrative   Married.   Highest level of education Bachelors.    Works as a Education officer, museum.   Social Determinants of Health   Financial Resource Strain:   . Difficulty of Paying Living Expenses: Not on file  Food Insecurity:   . Worried About Charity fundraiser in the Last Year: Not on file  . Ran Out of Food in the Last Year: Not on file  Transportation Needs:   . Lack of Transportation (Medical): Not on file  . Lack of Transportation (Non-Medical): Not on file  Physical Activity:   . Days of  Exercise per Week: Not on file  . Minutes of Exercise per Session: Not on file  Stress:   . Feeling of Stress : Not on file  Social Connections:   . Frequency of Communication with Friends and Family: Not on file  . Frequency of Social Gatherings with Friends and Family: Not on file  . Attends Religious Services: Not on file  . Active Member of Clubs or Organizations: Not on file  . Attends Archivist Meetings: Not on file  . Marital Status: Not on file  Intimate Partner Violence:   . Fear of Current or Ex-Partner: Not on file  . Emotionally Abused: Not on file  . Physically Abused: Not on file  . Sexually  Abused: Not on file    Outpatient Medications Prior to Visit  Medication Sig Dispense Refill  . amLODipine (NORVASC) 5 MG tablet TAKE 1 TABLET BY MOUTH EVERY DAY 90 tablet 1  . aspirin EC 81 MG tablet Take 81 mg by mouth daily.    Marland Kitchen b complex vitamins tablet Take 1 tablet by mouth daily.    . Cholecalciferol (VITAMIN D) 2000 units CAPS Take by mouth.    . cyclobenzaprine (FLEXERIL) 10 MG tablet Take 1 tablet (10 mg total) by mouth 3 (three) times daily as needed for muscle spasms. 30 tablet 0  . diclofenac (VOLTAREN) 75 MG EC tablet Take 1 tablet (75 mg total) by mouth 2 (two) times daily as needed. For inflammation/pain. 60 tablet 0  . fexofenadine (ALLEGRA) 180 MG tablet Take 180 mg by mouth daily as needed.    . hydrochlorothiazide (MICROZIDE) 12.5 MG capsule Take 12.5 mg by mouth daily.    . hydrOXYzine (ATARAX/VISTARIL) 10 MG tablet TAKE 1 CAPSULE BY MOUTH UP TO TWICE DAILY AS NEEDED FOR ANXIETY. 30 tablet 0  . Multiple Vitamin (MULTIVITAMIN) tablet Take 1 tablet by mouth daily.    . SUMAtriptan (IMITREX) 25 MG tablet PLEASE SEE ATTACHED FOR DETAILED DIRECTIONS    . triamcinolone cream (KENALOG) 0.5 % APPLY TO AFFECTED AREA TWICE A DAY 30 g 0  . TURMERIC PO Take 4 capsules by mouth daily.    . valACYclovir (VALTREX) 500 MG tablet Take 500 mg by mouth 2 (two) times daily.     No facility-administered medications prior to visit.      ROS:  Review of Systems  Constitutional: Negative for fever.  Gastrointestinal: Negative for blood in stool, constipation, diarrhea, nausea and vomiting.  Genitourinary: Positive for vaginal discharge. Negative for dyspareunia, dysuria, flank pain, frequency, hematuria, urgency, vaginal bleeding and vaginal pain.  Musculoskeletal: Negative for back pain.  Skin: Negative for rash.    OBJECTIVE:   Vitals:  BP 110/60   Ht 5\' 5"  (1.651 m)   Wt 168 lb (76.2 kg)   BMI 27.96 kg/m   Physical Exam Vitals reviewed.  Constitutional:       Appearance: She is well-developed.  Pulmonary:     Effort: Pulmonary effort is normal.  Genitourinary:    General: Normal vulva.     Pubic Area: No rash.      Labia:        Right: No rash, tenderness or lesion.        Left: No rash, tenderness or lesion.      Vagina: Vaginal discharge present. No erythema or tenderness.     Adnexa: Right adnexa normal and left adnexa normal.       Right: No mass or tenderness.  Left: No mass or tenderness.       Comments: NO EVID OF LS; NO ERYTHEMA, LESIONS  UTERUS/CX SURG ABSENT Musculoskeletal:        General: Normal range of motion.     Cervical back: Normal range of motion.  Skin:    General: Skin is warm and dry.  Neurological:     General: No focal deficit present.     Mental Status: She is alert and oriented to person, place, and time.  Psychiatric:        Mood and Affect: Mood normal.        Behavior: Behavior normal.        Thought Content: Thought content normal.        Judgment: Judgment normal.     Results: Results for orders placed or performed in visit on 12/30/19 (from the past 24 hour(s))  POCT Wet Prep with KOH     Status: Normal   Collection Time: 12/30/19  2:47 PM  Result Value Ref Range   Trichomonas, UA Negative    Clue Cells Wet Prep HPF POC neg    Epithelial Wet Prep HPF POC     Yeast Wet Prep HPF POC neg    Bacteria Wet Prep HPF POC     RBC Wet Prep HPF POC     WBC Wet Prep HPF POC     KOH Prep POC Negative Negative     Assessment/Plan: Vaginal discharge - Plan: Other/Misc lab test, POCT Wet Prep with KOH; Neg wet prep/pos sx. Check AV and BV culture. Will call with results.   Vaginal itching--neg exam/wet prep. Question chemical vs bacterial. Dove sens skin soap, sens skin detergent. Cont triamcinolone crm for now. Will f/u with culture results. May be 2 separate etiologies.     Return if symptoms worsen or fail to improve.  Alyze Lauf B. Skarlet Lyons, PA-C 12/30/2019 2:49 PM

## 2019-12-31 ENCOUNTER — Ambulatory Visit
Admission: RE | Admit: 2019-12-31 | Discharge: 2019-12-31 | Disposition: A | Discharge status: Home / Self Care | Payer: Managed Care, Other (non HMO) | Source: Ambulatory Visit | Attending: Primary Care | Admitting: Primary Care

## 2019-12-31 DIAGNOSIS — K769 Liver disease, unspecified: Secondary | ICD-10-CM

## 2019-12-31 MED ORDER — DICLOFENAC SODIUM 75 MG PO TBEC: 75.0000 mg | DELAYED_RELEASE_TABLET | Freq: Two times a day (BID) | ORAL | 0 refills | Status: DC | PRN

## 2019-12-31 NOTE — Telephone Encounter (Signed)
Per DPR, left detail message of [name]'s comments for patient to call back 

## 2019-12-31 NOTE — Telephone Encounter (Signed)
Refill sent to pharmacy.   

## 2019-12-31 NOTE — Telephone Encounter (Signed)
Spoken to patient and she stated that she does not need a refill.  However, patient is asking a refill of diclofenac (VOLTAREN) 75 MG EC tablet  Last prescribed on 07/23/2019. Last seen on 12/22/2019

## 2020-01-08 ENCOUNTER — Telehealth: Payer: Self-pay | Admitting: Obstetrics and Gynecology

## 2020-01-08 NOTE — Telephone Encounter (Signed)
One Swab culture done for AV and BV due to recurrent increased vag d/c. Both were neg. Pt already had neg gon/chlam/yeast culture with PCP. Pt changed to unscented soap and states d/c and vag itch have improved. Reassurance re: normal d/c. F/u prn.

## 2020-01-16 ENCOUNTER — Other Ambulatory Visit: Payer: Self-pay | Admitting: Family Medicine

## 2020-01-24 ENCOUNTER — Other Ambulatory Visit: Payer: Self-pay | Admitting: Primary Care

## 2020-01-24 DIAGNOSIS — G8929 Other chronic pain: Secondary | ICD-10-CM

## 2020-02-22 DIAGNOSIS — K760 Fatty (change of) liver, not elsewhere classified: Secondary | ICD-10-CM

## 2020-02-22 DIAGNOSIS — R7989 Other specified abnormal findings of blood chemistry: Secondary | ICD-10-CM

## 2020-02-22 DIAGNOSIS — K769 Liver disease, unspecified: Secondary | ICD-10-CM

## 2020-02-27 NOTE — Telephone Encounter (Signed)
Discussed concerns regarding the Covid-19 vaccine, she will be getting this soon through her employer.

## 2020-03-01 ENCOUNTER — Telehealth: Payer: Self-pay | Admitting: Primary Care

## 2020-03-01 NOTE — Telephone Encounter (Signed)
Rosaria Ferries, I'll proceed with the Peer to Peer and will try to get this done tomorrow during lunch. I'll update you once I speak with the insurance. Hold off on notifying the patient for now.

## 2020-03-01 NOTE — Telephone Encounter (Signed)
Received Denial letter from [employer]. You can call Health Services Dept  and do a Peer to Peer by calling [phone]. Let me know what you want to do so I can call your patient to let her know. She will also receive the same letter.

## 2020-03-03 NOTE — Telephone Encounter (Signed)
updated patient's chart of the vaccine

## 2020-03-04 ENCOUNTER — Ambulatory Visit: Payer: Managed Care, Other (non HMO)

## 2020-03-29 ENCOUNTER — Encounter: Payer: Self-pay | Admitting: Emergency Medicine

## 2020-03-29 ENCOUNTER — Ambulatory Visit (INDEPENDENT_AMBULATORY_CARE_PROVIDER_SITE_OTHER): Payer: Managed Care, Other (non HMO)

## 2020-03-29 ENCOUNTER — Other Ambulatory Visit: Payer: Self-pay

## 2020-03-29 ENCOUNTER — Ambulatory Visit
Admission: EM | Admit: 2020-03-29 | Discharge: 2020-03-29 | Disposition: A | Discharge status: Home / Self Care | Payer: Managed Care, Other (non HMO) | Attending: Urgent Care | Admitting: Urgent Care

## 2020-03-29 DIAGNOSIS — G8929 Other chronic pain: Secondary | ICD-10-CM | POA: Diagnosis present

## 2020-03-29 DIAGNOSIS — M5441 Lumbago with sciatica, right side: Secondary | ICD-10-CM | POA: Diagnosis present

## 2020-03-29 DIAGNOSIS — M546 Pain in thoracic spine: Secondary | ICD-10-CM | POA: Diagnosis not present

## 2020-03-29 DIAGNOSIS — R001 Bradycardia, unspecified: Secondary | ICD-10-CM | POA: Insufficient documentation

## 2020-03-29 DIAGNOSIS — R0789 Other chest pain: Secondary | ICD-10-CM

## 2020-03-29 DIAGNOSIS — M79672 Pain in left foot: Secondary | ICD-10-CM | POA: Diagnosis present

## 2020-03-29 MED ORDER — DICLOFENAC SODIUM 75 MG PO TBEC: 75.0000 mg | DELAYED_RELEASE_TABLET | Freq: Two times a day (BID) | ORAL | 0 refills | Status: DC | PRN

## 2020-03-29 NOTE — ED Triage Notes (Signed)
Pt c/o left foot pain. Started this morning. She states that when she feels the pain it is sharp and radiates up her leg and felt like weakness in her leg. She states she also she had pain in the back near her shoulder blades and chest.

## 2020-03-29 NOTE — Discharge Instructions (Addendum)
Recommend try Diclofenac 75mg  twice a day as needed for pain. Continue to monitor symptoms. Recommend contact your Cardiologist tomorrow for follow-up. Also call your Podiatrist (Foot Doctor) tomorrow to schedule appointment if foot pain continues. If pain gets worse or any significant pain breathing, dizziness, sweating or vision changes occur, go to the ER ASAP. Otherwise, follow-up with your health care providers as recommended.

## 2020-03-29 NOTE — ED Provider Notes (Signed)
[facility]    CSN: [id number] Arrival date & time: 03/29/20  1738      History   Chief Complaint Chief Complaint  Patient presents with  . Foot Pain    left  . Chest Pain    HPI Samantha Hall is a 59 y.o. female.   59 year old female presents with left foot pain that started today. Also having left sided upper back pain that occasionally radiates to her left shoulder and upper chest. Pain in her foot started this morning when she was walking but not consistently present with movement. She can be sitting and randomly experiencing sharp, shooting pains at the top of her foot near her ankle that will radiate up her lower leg. Denies any numbness but does have difficulty walking when pain is occurring. No distinct trauma to her foot. Did have bunion repair of the left foot in the past. Also has been having occasional pain, mainly on the left side of her back near her shoulder blade, for the past few weeks. Today the pain has become more severe and occasionally radiates to her left arm. She denies any fever, congestion, cough, shortness of breath, palpitations, dizziness, sweating, nausea, vomiting or diarrhea. She does have slight pain in her chest with inspiration. She has not taken anything for pain. She does have a history of HTN, seasonal allergies, migraine headaches and prior injury to her back in which she has various back, elbow, knee and shoulder pain. She is currently on Norvasc and HCTZ daily and Voltaren, Flexeril, Allegra, and Imitrex prn. She has not taken Voltaren for over a week. She is concerned about heart trouble since her friend's only symptom of a heart attack was dorsal foot pain. She also indicates that her pulse usually runs in the 70's to 80's and is concerned that it is low today.    The history is provided by the patient.    Past Medical History:  Diagnosis Date  . Elevated antinuclear antibody (ANA) level    Negative workup for Lupus.  . Fibroids    . Fracture of left patella 04/30/2018  . Hyperlipidemia   . Hypertension    Controlled  . Osteoarthritis   . Sciatica   . Vitamin D deficiency     Patient Active Problem List   Diagnosis Date Noted  . Vaginal discharge 12/22/2019  . Liver lesion, left lobe 11/17/2019  . Kidney lesion, native, right 11/17/2019  . Breast pain 11/17/2019  . Constipation 11/17/2019  . Rash and nonspecific skin eruption 03/31/2019  . Numbness and tingling 03/11/2019  . Pain of upper abdomen 12/27/2018  . Bilateral hand pain 12/27/2018  . Chronic shoulder pain 06/10/2018  . Elevated LFTs 06/10/2018  . Prediabetes 06/10/2018  . Knee pain, bilateral 04/29/2018  . Situational anxiety 12/05/2017  . Chronic low back pain 10/31/2017  . Osteoarthritis 02/02/2017  . Preventative health care 02/02/2017  . Hyperlipidemia 02/02/2017  . Essential hypertension 11/22/2015    Past Surgical History:  Procedure Laterality Date  . ABDOMINAL HYSTERECTOMY  2002  . CHOLECYSTECTOMY  2015  . TUBAL LIGATION  1984    OB History    Gravida  1   Para  1   Term  0   Preterm  1   AB  0   Living  1     SAB  0   TAB  0   Ectopic  0   Multiple  0   Live Births  1  Home Medications    Prior to Admission medications   Medication Sig Start Date End Date Taking? Authorizing Provider  amLODipine (NORVASC) 5 MG tablet TAKE 1 TABLET BY MOUTH EVERY DAY 10/13/19  Yes Pleas Koch, NP  b complex vitamins tablet Take 1 tablet by mouth daily.   Yes [provider]  Cholecalciferol (VITAMIN D) 2000 units CAPS Take by mouth.   Yes [provider]  cyclobenzaprine (FLEXERIL) 10 MG tablet Take 1 tablet (10 mg total) by mouth 3 (three) times daily as needed for muscle spasms. 11/21/17  Yes Pleas Koch, NP  fexofenadine (ALLEGRA) 180 MG tablet Take 180 mg by mouth daily as needed. 11/15/19  Yes [provider]  hydrochlorothiazide (MICROZIDE) 12.5 MG capsule  TAKE 1 CAPSULE BY MOUTH EVERY MORNING 01/16/20  Yes Pleas Koch, NP  hydrOXYzine (ATARAX/VISTARIL) 10 MG tablet TAKE 1 CAPSULE BY MOUTH UP TO TWICE DAILY AS NEEDED FOR ANXIETY. 12/04/18  Yes Pleas Koch, NP  SUMAtriptan (IMITREX) 25 MG tablet PLEASE SEE ATTACHED FOR DETAILED DIRECTIONS 12/18/19  Yes [provider]  TURMERIC PO Take 4 capsules by mouth daily.   Yes [provider]  diclofenac (VOLTAREN) 75 MG EC tablet Take 1 tablet (75 mg total) by mouth 2 (two) times daily as needed for moderate pain. For inflammation/pain. 03/29/20   Katy Apo, NP  triamcinolone cream (KENALOG) 0.5 % APPLY TO AFFECTED AREA TWICE A DAY 08/06/19   Pleas Koch, NP    Family History Family History  Problem Relation Age of Onset  . Diabetes Mother   . Heart disease Mother   . Heart disease Father     Social History Social History   Tobacco Use  . Smoking status: Never Smoker  . Smokeless tobacco: Never Used  Substance Use Topics  . Alcohol use: Yes    Alcohol/week: 0.0 standard drinks    Comment: sociall  . Drug use: No     Allergies   Cefazolin and Sulfa antibiotics   Review of Systems Review of Systems  Constitutional: Negative for activity change, appetite change, chills, diaphoresis, fatigue and fever.  HENT: Negative for congestion, facial swelling, sore throat and trouble swallowing.   Eyes: Negative for photophobia and visual disturbance.  Respiratory: Negative for cough, chest tightness, shortness of breath, wheezing and stridor.   Cardiovascular: Positive for chest pain. Negative for palpitations and leg swelling.  Gastrointestinal: Negative for abdominal pain, nausea and vomiting.  Genitourinary: Negative for difficulty urinating and flank pain.  Musculoskeletal: Positive for arthralgias, back pain and gait problem. Negative for joint swelling, neck pain and neck stiffness.  Skin: Negative for color change, rash and wound.   Allergic/Immunologic: Positive for environmental allergies. Negative for food allergies and immunocompromised state.  Neurological: Positive for headaches. Negative for dizziness, tremors, seizures, syncope, facial asymmetry, speech difficulty, weakness, light-headedness and numbness.  Hematological: Negative for adenopathy. Does not bruise/bleed easily.     Physical Exam Triage Vital Signs ED Triage Vitals  Enc Vitals Group     BP 03/29/20 1801 127/73     Pulse Rate 03/29/20 1801 (!) 58     Resp 03/29/20 1801 18     Temp 03/29/20 1801 97.7 F (36.5 C)     Temp Source 03/29/20 1801 Oral     SpO2 03/29/20 1801 100 %     Weight 03/29/20 1755 165 lb (74.8 kg)     Height 03/29/20 1755 5\' 5"  (1.651 m)     Head  Circumference --      Peak Flow --      Pain Score 03/29/20 1755 9     Pain Loc --      Pain Edu? --      Excl. in [place]? --    No data found.  Updated Vital Signs BP 127/73 (BP Location: Right Arm)   Pulse (!) 58   Temp 97.7 F (36.5 C) (Oral)   Resp 18   Ht 5\' 5"  (1.651 m)   Wt 165 lb (74.8 kg)   SpO2 100%   BMI 27.46 kg/m   Visual Acuity Right Eye Distance:   Left Eye Distance:   Bilateral Distance:    Right Eye Near:   Left Eye Near:    Bilateral Near:     Physical Exam Vitals and nursing note reviewed.  Constitutional:      General: She is awake. She is not in acute distress.    Appearance: She is well-developed and well-groomed. She is not ill-appearing.     Comments: Patient sitting comfortably on exam table in no acute distress but occasionally appears to be in pain and is massaging her left foot.   HENT:     Head: Normocephalic and atraumatic.     Right Ear: Hearing and external ear normal.     Left Ear: Hearing and external ear normal.  Eyes:     Extraocular Movements: Extraocular movements intact.     Conjunctiva/sclera: Conjunctivae normal.  Cardiovascular:     Rate and Rhythm: Regular rhythm. Bradycardia present.  No extrasystoles are  present.    Pulses: Normal pulses.     Heart sounds: Normal heart sounds. No murmur.  Pulmonary:     Effort: Pulmonary effort is normal. No accessory muscle usage or respiratory distress.     Breath sounds: Normal breath sounds and air entry. No stridor or decreased air movement. No decreased breath sounds, wheezing, rhonchi or rales.  Musculoskeletal:        General: Tenderness present. Normal range of motion.     Cervical back: Normal range of motion and neck supple. No rigidity.     Right foot: Normal.     Left foot: Normal range of motion and normal capillary refill. Bunion (repaired) and tenderness (tenderness only when pain is occuring) present. No swelling, deformity, foot drop, prominent metatarsal heads, bony tenderness or crepitus. Normal pulse.       Feet:     Comments: Full range of motion of right and left foot and ankle. No distinct swelling. No redness or firmness. No numbness or decreased sensation. Only tender on proximal dorsal aspect of left foot when active pain is occurring. Otherwise non-tender. Good distal pulses and capillary refill. No swelling, redness or pain in lower legs.   Lymphadenopathy:     Cervical: No cervical adenopathy.  Skin:    General: Skin is warm and dry.     Capillary Refill: Capillary refill takes less than 2 seconds.     Findings: No bruising, erythema or rash.  Neurological:     General: No focal deficit present.     Mental Status: She is alert and oriented to person, place, and time.     Sensory: Sensation is intact.     Motor: Motor function is intact.  Psychiatric:        Mood and Affect: Mood normal.        Behavior: Behavior normal. Behavior is cooperative.        Thought Content: Thought  content normal.        Judgment: Judgment normal.      UC Treatments / Results  Labs (all labs ordered are listed, but only abnormal results are displayed) Labs Reviewed - No data to display  EKG   Radiology DG Chest 2 View  Result Date:  03/29/2020 CLINICAL DATA:  Left-sided chest pain EXAM: CHEST - 2 VIEW COMPARISON:  02/13/2019 FINDINGS: The heart size and mediastinal contours are within normal limits. Both lungs are clear. The visualized skeletal structures are unremarkable. IMPRESSION: No active cardiopulmonary disease. Electronically Signed   By: Donavan Foil M.D.   On: 03/29/2020 20:11    Procedures ED EKG  Date/Time: 03/29/2020 7:07 PM Performed by: Katy Apo, NP Authorized by: Karen Kitchens, NP   ECG reviewed by ED Physician in the absence of a cardiologist: no   Interpretation:    Interpretation: non-specific   Rate:    ECG rate:  55   ECG rate assessment: bradycardic   Rhythm:    Rhythm: sinus bradycardia   Ectopy:    Ectopy: none   QRS:    QRS axis:  Normal Conduction:    Conduction: normal   ST segments:    ST segments:  Normal T waves:    T waves: normal   Comments:     No distinct abnormality detected except sinus bradycardia.    (including critical care time)  Medications Ordered in UC Medications - No data to display  Initial Impression / Assessment and Plan / UC Course  I have reviewed the triage vital signs and the nursing notes.  Pertinent labs & imaging results that were available during my care of the patient were reviewed by me and considered in my medical decision making (see chart for details).    Performed chest x-ray due to chest pain- especially with inspiration. No enlarged heart or pulmonary disease present.  Performed ECG which confirmed bradycardia but otherwise sinus rhythm and no distinct changes/abnormalities.  Did not perform imaging of left foot- since no trauma and no distinct injury, do not feel that x-ray will be beneficial at this time.  Discussed findings with patient- unable to determine cause of foot pain and upper back/chest pain. Can not completely rule out MI but lower probability due to normal ECG and minimal other symptoms. Patient is stable with no  emergent symptoms tonight- will have patient call her Cardiologist tomorrow to schedule appointment for follow-up. Discussed that pain in foot and in back may be musculoskeletal- needs further evaluation by her PCP and possibly Podiatrist/Orthopedic.Do not see evidence of a blood clot in her foot or leg.  Recommend trial Diclofenac 75mg  twice a day as needed for pain since that has helped with other pain syndromes in the past. Continue to monitor symptoms. If chest pain worsens, any dizziness, difficulty breathing, vision changes, nausea or vomiting occur, call 911 and go to the ER ASAP. Otherwise, follow-up with your PCP, Cardiologist and other specialists as planned.  Final Clinical Impressions(s) / UC Diagnoses   Final diagnoses:  Atypical chest pain  Acute left-sided thoracic back pain  Acute pain of left foot  Bradycardia     Discharge Instructions     Recommend try Diclofenac 75mg  twice a day as needed for pain. Continue to monitor symptoms. Recommend contact your Cardiologist tomorrow for follow-up. Also call your Podiatrist (Foot Doctor) tomorrow to schedule appointment if foot pain continues. If pain gets worse or any significant pain breathing, dizziness, sweating or vision  changes occur, go to the ER ASAP. Otherwise, follow-up with your health care providers as recommended.     ED Prescriptions    Medication Sig Dispense Auth. Provider   diclofenac (VOLTAREN) 75 MG EC tablet Take 1 tablet (75 mg total) by mouth 2 (two) times daily as needed for moderate pain. For inflammation/pain. 60 tablet Quincie Haroon, Nicholes Stairs, NP     PDMP not reviewed this encounter.   Katy Apo, NP 03/30/20 1450

## 2020-03-31 ENCOUNTER — Ambulatory Visit: Payer: Managed Care, Other (non HMO) | Admitting: Primary Care

## 2020-04-06 ENCOUNTER — Other Ambulatory Visit: Payer: Self-pay | Admitting: Primary Care

## 2020-04-06 DIAGNOSIS — I10 Essential (primary) hypertension: Secondary | ICD-10-CM

## 2020-04-19 ENCOUNTER — Other Ambulatory Visit
Admission: RE | Admit: 2020-04-19 | Discharge: 2020-04-19 | Disposition: A | Discharge status: Home / Self Care | Payer: Managed Care, Other (non HMO) | Source: Ambulatory Visit | Attending: Internal Medicine | Admitting: Internal Medicine

## 2020-04-19 DIAGNOSIS — Z79899 Other long term (current) drug therapy: Secondary | ICD-10-CM | POA: Insufficient documentation

## 2020-04-19 LAB — BRAIN NATRIURETIC PEPTIDE: B Natriuretic Peptide: 28 pg/mL (ref 0.0–100.0)

## 2020-04-26 ENCOUNTER — Other Ambulatory Visit: Payer: Self-pay | Admitting: Family Medicine

## 2020-04-26 DIAGNOSIS — R7989 Other specified abnormal findings of blood chemistry: Secondary | ICD-10-CM

## 2020-04-26 DIAGNOSIS — K760 Fatty (change of) liver, not elsewhere classified: Secondary | ICD-10-CM

## 2020-04-26 DIAGNOSIS — I10 Essential (primary) hypertension: Secondary | ICD-10-CM

## 2020-04-26 DIAGNOSIS — R7303 Prediabetes: Secondary | ICD-10-CM

## 2020-04-26 NOTE — Progress Notes (Signed)
Annual labs

## 2020-04-27 ENCOUNTER — Other Ambulatory Visit (INDEPENDENT_AMBULATORY_CARE_PROVIDER_SITE_OTHER): Payer: Managed Care, Other (non HMO)

## 2020-04-27 ENCOUNTER — Other Ambulatory Visit: Payer: Self-pay

## 2020-04-27 ENCOUNTER — Other Ambulatory Visit: Payer: Managed Care, Other (non HMO)

## 2020-04-27 DIAGNOSIS — K760 Fatty (change of) liver, not elsewhere classified: Secondary | ICD-10-CM | POA: Diagnosis not present

## 2020-04-27 DIAGNOSIS — I10 Essential (primary) hypertension: Secondary | ICD-10-CM | POA: Diagnosis not present

## 2020-04-27 DIAGNOSIS — R7303 Prediabetes: Secondary | ICD-10-CM

## 2020-04-27 DIAGNOSIS — R7989 Other specified abnormal findings of blood chemistry: Secondary | ICD-10-CM

## 2020-04-28 LAB — COMPREHENSIVE METABOLIC PANEL
ALT: 25 U/L (ref 0–35)
AST: 17 U/L (ref 0–37)
Albumin: 4.2 g/dL (ref 3.5–5.2)
Alkaline Phosphatase: 76 U/L (ref 39–117)
BUN: 20 mg/dL (ref 6–23)
CO2: 28 mEq/L (ref 19–32)
Calcium: 8.9 mg/dL (ref 8.4–10.5)
Chloride: 103 mEq/L (ref 96–112)
Creatinine, Ser: 0.93 mg/dL (ref 0.40–1.20)
GFR: 74.44 mL/min (ref >60.00)
Glucose, Bld: 81 mg/dL (ref 70–99)
Potassium: 3.7 mEq/L (ref 3.5–5.1)
Sodium: 137 mEq/L (ref 135–145)
Total Bilirubin: 0.4 mg/dL (ref 0.2–1.2)
Total Protein: 7.1 g/dL (ref 6.0–8.3)

## 2020-04-28 LAB — LIPID PANEL
Cholesterol: 238 mg/dL — ABNORMAL HIGH (ref 0–200)
HDL: 76.9 mg/dL (ref >39.00)
LDL Cholesterol: 144 mg/dL — ABNORMAL HIGH (ref 0–99)
NonHDL: 160.9
Total CHOL/HDL Ratio: 3
Triglycerides: 83 mg/dL (ref 0.0–149.0)
VLDL: 16.6 mg/dL (ref 0.0–40.0)

## 2020-04-28 LAB — HEMOGLOBIN A1C: Hgb A1c MFr Bld: 6 % (ref 4.6–6.5)

## 2020-04-30 ENCOUNTER — Other Ambulatory Visit: Payer: Self-pay

## 2020-04-30 ENCOUNTER — Ambulatory Visit (INDEPENDENT_AMBULATORY_CARE_PROVIDER_SITE_OTHER): Payer: Managed Care, Other (non HMO) | Admitting: Primary Care

## 2020-04-30 ENCOUNTER — Encounter: Payer: Self-pay | Admitting: Primary Care

## 2020-04-30 VITALS — BP 122/80 | HR 82 | Temp 95.8°F | Ht 65.0 in | Wt 170.8 lb

## 2020-04-30 DIAGNOSIS — K769 Liver disease, unspecified: Secondary | ICD-10-CM

## 2020-04-30 DIAGNOSIS — F418 Other specified anxiety disorders: Secondary | ICD-10-CM

## 2020-04-30 DIAGNOSIS — Z0000 Encounter for general adult medical examination without abnormal findings: Secondary | ICD-10-CM

## 2020-04-30 DIAGNOSIS — Z23 Encounter for immunization: Secondary | ICD-10-CM

## 2020-04-30 DIAGNOSIS — R7989 Other specified abnormal findings of blood chemistry: Secondary | ICD-10-CM

## 2020-04-30 DIAGNOSIS — M1991 Primary osteoarthritis, unspecified site: Secondary | ICD-10-CM | POA: Diagnosis not present

## 2020-04-30 DIAGNOSIS — E785 Hyperlipidemia, unspecified: Secondary | ICD-10-CM

## 2020-04-30 DIAGNOSIS — I10 Essential (primary) hypertension: Secondary | ICD-10-CM | POA: Diagnosis not present

## 2020-04-30 DIAGNOSIS — K7689 Other specified diseases of liver: Secondary | ICD-10-CM

## 2020-04-30 DIAGNOSIS — G8929 Other chronic pain: Secondary | ICD-10-CM

## 2020-04-30 DIAGNOSIS — N289 Disorder of kidney and ureter, unspecified: Secondary | ICD-10-CM

## 2020-04-30 DIAGNOSIS — E559 Vitamin D deficiency, unspecified: Secondary | ICD-10-CM

## 2020-04-30 DIAGNOSIS — M5441 Lumbago with sciatica, right side: Secondary | ICD-10-CM

## 2020-04-30 DIAGNOSIS — N898 Other specified noninflammatory disorders of vagina: Secondary | ICD-10-CM

## 2020-04-30 DIAGNOSIS — R21 Rash and other nonspecific skin eruption: Secondary | ICD-10-CM

## 2020-04-30 DIAGNOSIS — K5900 Constipation, unspecified: Secondary | ICD-10-CM

## 2020-04-30 DIAGNOSIS — R7303 Prediabetes: Secondary | ICD-10-CM

## 2020-04-30 MED ORDER — CYCLOBENZAPRINE HCL 10 MG PO TABS: 10.0000 mg | ORAL_TABLET | Freq: Two times a day (BID) | ORAL | 0 refills | Status: DC | PRN

## 2020-04-30 NOTE — Assessment & Plan Note (Signed)
Recent LDL of 144 with ASCVD score of 5%. Encouraged a healthy diet and exercise to prevent heart disease/stroke.   Repeat lipid panel in 6 months.

## 2020-04-30 NOTE — Assessment & Plan Note (Signed)
Well controlled on Amlodipine 5 mg alone. Continue off of HCTZ.  CMP reviewed.

## 2020-04-30 NOTE — Patient Instructions (Addendum)
Scheduled your colonoscopy as discussed.   Schedule a lab appointment for 6 months to repeat the diabetes, cholesterol, and vitamin D tests.  Start exercising. You should be getting 150 minutes of moderate intensity exercise weekly.  It's important to improve your diet by reducing consumption of fast food, fried food, processed snack foods, sugary drinks. Increase consumption of fresh vegetables and fruits, whole grains, water.  Ensure you are drinking 64 ounces of water daily.  It was a pleasure to see you today!   Preventive Care 33-56 Years Old, Female Preventive care refers to visits with your health care provider and lifestyle choices that can promote health and wellness. This includes:  A yearly physical exam. This may also be called an annual well check.  Regular dental visits and eye exams.  Immunizations.  Screening for certain conditions.  Healthy lifestyle choices, such as eating a healthy diet, getting regular exercise, not using drugs or products that contain nicotine and tobacco, and limiting alcohol use. What can I expect for my preventive care visit? Physical exam Your health care provider will check your:  Height and weight. This may be used to calculate body mass index (BMI), which tells if you are at a healthy weight.  Heart rate and blood pressure.  Skin for abnormal spots. Counseling Your health care provider may ask you questions about your:  Alcohol, tobacco, and drug use.  Emotional well-being.  Home and relationship well-being.  Sexual activity.  Eating habits.  Work and work Statistician.  Method of birth control.  Menstrual cycle.  Pregnancy history. What immunizations do I need?  Influenza (flu) vaccine  This is recommended every year. Tetanus, diphtheria, and pertussis (Tdap) vaccine  You may need a Td booster every 10 years. Varicella (chickenpox) vaccine  You may need this if you have not been vaccinated. Zoster (shingles)  vaccine  You may need this after age 10. Measles, mumps, and rubella (MMR) vaccine  You may need at least one dose of MMR if you were born in 1957 or later. You may also need a second dose. Pneumococcal conjugate (PCV13) vaccine  You may need this if you have certain conditions and were not previously vaccinated. Pneumococcal polysaccharide (PPSV23) vaccine  You may need one or two doses if you smoke cigarettes or if you have certain conditions. Meningococcal conjugate (MenACWY) vaccine  You may need this if you have certain conditions. Hepatitis A vaccine  You may need this if you have certain conditions or if you travel or work in places where you may be exposed to hepatitis A. Hepatitis B vaccine  You may need this if you have certain conditions or if you travel or work in places where you may be exposed to hepatitis B. Haemophilus influenzae type b (Hib) vaccine  You may need this if you have certain conditions. Human papillomavirus (HPV) vaccine  If recommended by your health care provider, you may need three doses over 6 months. You may receive vaccines as individual doses or as more than one vaccine together in one shot (combination vaccines). Talk with your health care provider about the risks and benefits of combination vaccines. What tests do I need? Blood tests  Lipid and cholesterol levels. These may be checked every 5 years, or more frequently if you are over 59 years old.  Hepatitis C test.  Hepatitis B test. Screening  Lung cancer screening. You may have this screening every year starting at age 51 if you have a 30-pack-year history of smoking  and currently smoke or have quit within the past 15 years.  Colorectal cancer screening. All adults should have this screening starting at age 53 and continuing until age 24. Your health care provider may recommend screening at age 40 if you are at increased risk. You will have tests every 1-10 years, depending on your  results and the type of screening test.  Diabetes screening. This is done by checking your blood sugar (glucose) after you have not eaten for a while (fasting). You may have this done every 1-3 years.  Mammogram. This may be done every 1-2 years. Talk with your health care provider about when you should start having regular mammograms. This may depend on whether you have a family history of breast cancer.  BRCA-related cancer screening. This may be done if you have a family history of breast, ovarian, tubal, or peritoneal cancers.  Pelvic exam and Pap test. This may be done every 3 years starting at age 79. Starting at age 40, this may be done every 5 years if you have a Pap test in combination with an HPV test. Other tests  Sexually transmitted disease (STD) testing.  Bone density scan. This is done to screen for osteoporosis. You may have this scan if you are at high risk for osteoporosis. Follow these instructions at home: Eating and drinking  Eat a diet that includes fresh fruits and vegetables, whole grains, lean protein, and low-fat dairy.  Take vitamin and mineral supplements as recommended by your health care provider.  Do not drink alcohol if: ? Your health care provider tells you not to drink. ? You are pregnant, may be pregnant, or are planning to become pregnant.  If you drink alcohol: ? Limit how much you have to 0-1 drink a day. ? Be aware of how much alcohol is in your drink. In the [place] one drink equals one 12 oz bottle of beer (355 mL), one 5 oz glass of wine (148 mL), or one 1 oz glass of hard liquor (44 mL). Lifestyle  Take daily care of your teeth and gums.  Stay active. Exercise for at least 30 minutes on 5 or more days each week.  Do not use any products that contain nicotine or tobacco, such as cigarettes, e-cigarettes, and chewing tobacco. If you need help quitting, ask your health care provider.  If you are sexually active, practice safe sex. Use a  condom or other form of birth control (contraception) in order to prevent pregnancy and STIs (sexually transmitted infections).  If told by your health care provider, take low-dose aspirin daily starting at age 63. What's next?  Visit your health care provider once a year for a well check visit.  Ask your health care provider how often you should have your eyes and teeth checked.  Stay up to date on all vaccines. This information is not intended to replace advice given to you by your health care provider. Make sure you discuss any questions you have with your health care provider. Document Revised: 08/15/2018 Document Reviewed: 08/15/2018 Elsevier Patient Education  2020 [employer].

## 2020-04-30 NOTE — Assessment & Plan Note (Signed)
Chronic, overall doing well controlled on diclofenac and PRN cyclobenzaprine. Continue same.

## 2020-04-30 NOTE — Progress Notes (Signed)
Subjective:    Patient ID: Samantha Hall, female    DOB: [birth year], 60 y.o.   MRN: [id number]  HPI  This visit occurred during the SARS-CoV-2 public health emergency.  Safety protocols were in place, including screening questions prior to the visit, additional usage of staff PPE, and extensive cleaning of exam room while observing appropriate contact time as indicated for disinfecting solutions.   Samantha Hall is a 59 year old female who presents today for complete physical.  Immunizations: -Tetanus: Unsure, believes it's been over 10 years.  -Influenza: Did not complete last season  -Shingles: Never completed  -Covid-19: Completed series  Diet: She endorses a fair diet. Exercise: Some walking, plans on using her exercise bike.  Eye exam: Completed in 2021 Dental exam: Completes semi-annually   Pap Smear: Hysterectomy  Mammogram: Completed in December 2020 Colonoscopy: Due, completed in 2011. Follows with [facility] in [place].  Hep C Screen: Negative  BP Readings from Last 3 Encounters:  04/30/20 122/80  03/29/20 127/73  12/30/19 110/60   The 10-year ASCVD risk score Mikey Bussing DC Jr., et al., 2013) is: 5.2%   Values used to calculate the score:     Age: 71 years     Sex: Female     Is Non-Hispanic African American: Yes     Diabetic: No     Tobacco smoker: No     Systolic Blood Pressure: 123XX123 mmHg     Is BP treated: Yes     HDL Cholesterol: 76.9 mg/dL     Total Cholesterol: 238 mg/dL   Review of Systems  Constitutional: Negative for unexpected weight change.  HENT: Negative for rhinorrhea.   Respiratory: Negative for cough and shortness of breath.   Cardiovascular: Negative for chest pain.  Gastrointestinal: Negative for constipation and diarrhea.  Genitourinary: Positive for vaginal discharge. Negative for difficulty urinating.  Musculoskeletal: Positive for arthralgias and back pain.  Skin: Negative for rash.  Allergic/Immunologic: Negative for  environmental allergies.  Neurological: Negative for dizziness, numbness and headaches.  Psychiatric/Behavioral: The patient is not nervous/anxious.        Past Medical History:  Diagnosis Date  . Elevated antinuclear antibody (ANA) level    Negative workup for Lupus.  . Fibroids   . Fracture of left patella 04/30/2018  . Hyperlipidemia   . Hypertension    Controlled  . Osteoarthritis   . Sciatica   . Vitamin D deficiency      Social History   Socioeconomic History  . Marital status: Married    Spouse name: Not on file  . Number of children: Not on file  . Years of education: Not on file  . Highest education level: Not on file  Occupational History  . Not on file  Tobacco Use  . Smoking status: Never Smoker  . Smokeless tobacco: Never Used  Substance and Sexual Activity  . Alcohol use: Yes    Alcohol/week: 0.0 standard drinks    Comment: sociall  . Drug use: No  . Sexual activity: Yes    Birth control/protection: Surgical    Comment: Hysterectomy  Other Topics Concern  . Not on file  Social History Narrative   Married.   Highest level of education Bachelors.    Works as a Education officer, museum.   Social Determinants of Health   Financial Resource Strain:   . Difficulty of Paying Living Expenses:   Food Insecurity:   . Worried About Charity fundraiser in the Last Year:   .  Ran Out of Food in the Last Year:   Transportation Needs:   . Film/video editor (Medical):   Marland Kitchen Lack of Transportation (Non-Medical):   Physical Activity:   . Days of Exercise per Week:   . Minutes of Exercise per Session:   Stress:   . Feeling of Stress :   Social Connections:   . Frequency of Communication with Friends and Family:   . Frequency of Social Gatherings with Friends and Family:   . Attends Religious Services:   . Active Member of Clubs or Organizations:   . Attends Archivist Meetings:   Marland Kitchen Marital Status:   Intimate Partner Violence:   . Fear of Current or  Ex-Partner:   . Emotionally Abused:   Marland Kitchen Physically Abused:   . Sexually Abused:     Past Surgical History:  Procedure Laterality Date  . ABDOMINAL HYSTERECTOMY  2002  . CHOLECYSTECTOMY  2015  . TUBAL LIGATION  1984    Family History  Problem Relation Age of Onset  . Diabetes Mother   . Heart disease Mother   . Heart disease Father     Allergies  Allergen Reactions  . Cefazolin Swelling and Anaphylaxis    Difficulty breathing  . Sulfa Antibiotics Rash    Current Outpatient Medications on File Prior to Visit  Medication Sig Dispense Refill  . amLODipine (NORVASC) 5 MG tablet Take 1 tablet (5 mg total) by mouth daily. DUE FOR A FOLLOW UP APPOINTMENT 90 tablet 0  . b complex vitamins tablet Take 1 tablet by mouth daily.    . Cholecalciferol (VITAMIN D) 2000 units CAPS Take by mouth.    . diclofenac (VOLTAREN) 75 MG EC tablet Take 1 tablet (75 mg total) by mouth 2 (two) times daily as needed for moderate pain. For inflammation/pain. 60 tablet 0  . fexofenadine (ALLEGRA) 180 MG tablet Take 180 mg by mouth daily as needed.    . hydrOXYzine (ATARAX/VISTARIL) 10 MG tablet TAKE 1 CAPSULE BY MOUTH UP TO TWICE DAILY AS NEEDED FOR ANXIETY. 30 tablet 0  . SUMAtriptan (IMITREX) 25 MG tablet PLEASE SEE ATTACHED FOR DETAILED DIRECTIONS    . triamcinolone cream (KENALOG) 0.5 % APPLY TO AFFECTED AREA TWICE A DAY 30 g 0  . TURMERIC PO Take 4 capsules by mouth daily.     No current facility-administered medications on file prior to visit.    BP 122/80   Pulse 82   Temp (!) 95.8 F (35.4 C) (Temporal)   Ht 5\' 5"  (1.651 m)   Wt 170 lb 12 oz (77.5 kg)   SpO2 98%   BMI 28.41 kg/m    Objective:   Physical Exam  Constitutional: She is oriented to person, place, and time. She appears well-nourished.  HENT:  Right Ear: Tympanic membrane and ear canal normal.  Left Ear: Tympanic membrane and ear canal normal.  Mouth/Throat: Oropharynx is clear and moist.  Eyes: Pupils are equal, round,  and reactive to light. EOM are normal.  Cardiovascular: Normal rate and regular rhythm.  Respiratory: Effort normal and breath sounds normal.  GI: Soft. Bowel sounds are normal. There is no abdominal tenderness.  Musculoskeletal:        General: Normal range of motion.     Cervical back: Neck supple.  Neurological: She is alert and oriented to person, place, and time. No cranial nerve deficit.  Reflex Scores:      Patellar reflexes are 2+ on the right side and 2+ on the  left side. Skin: Skin is warm and dry.  Psychiatric: She has a normal mood and affect.           Assessment & Plan:

## 2020-04-30 NOTE — Assessment & Plan Note (Signed)
Evaluated by GYN. Continued vaginal discharge, she will contact her GYN.

## 2020-04-30 NOTE — Assessment & Plan Note (Signed)
Intermittent, following with chiropractor. Continue diclofenac and cyclobenzaprine PRN.

## 2020-04-30 NOTE — Assessment & Plan Note (Signed)
Normal on recent labs. Continue to monitor.  

## 2020-04-30 NOTE — Assessment & Plan Note (Signed)
Compliant to vitamin D 4000 units daily. Repeat vitamin D pending.

## 2020-04-30 NOTE — Assessment & Plan Note (Signed)
Benign 6 mm liver cyst that has not changed based off of imaging from January 2021. Increased echonigicity suspected to be secondary to fatty liver.   Repeat liver ultrasound in November 2021 along with renal ultrasound.

## 2020-04-30 NOTE — Assessment & Plan Note (Signed)
Recent renal function unremarkable.  Discussed options to repeat imaging for small cyst, we decided to repeat imaging in November 2021.

## 2020-04-30 NOTE — Assessment & Plan Note (Signed)
Intermittent, continue to monitor. Encouraged exercise, fiber.

## 2020-04-30 NOTE — Assessment & Plan Note (Addendum)
Recent A1C of 6.0 which is an improvement from 6.4. I encouraged a healthy diet and regular exercise. Continue to monitor.

## 2020-04-30 NOTE — Assessment & Plan Note (Signed)
Overall stable, infrequent use of hydroxyzine.

## 2020-04-30 NOTE — Assessment & Plan Note (Signed)
6 mm grossly unchanged cyst reported on imaging from January 2021. Repeat imaging in November 2021 along with renal ultrasound.

## 2020-04-30 NOTE — Addendum Note (Signed)
Addended by: Jacqualin Combes on: 04/30/2020 12:42 PM   Modules accepted: Orders

## 2020-04-30 NOTE — Assessment & Plan Note (Signed)
Tetanus due, provided today. Shingrix due, she will think about this.  Mammogram UTD. Colonoscopy due, she will get this done through [facility] in [place]. Discussed to have them send me the report.  Encouraged regular exercise, healthy diet.  Exam today unremarkable. Labs reviewed.

## 2020-04-30 NOTE — Assessment & Plan Note (Signed)
No recent rashes, no use of triamcinolone cream. Continue to monitor.

## 2020-05-03 ENCOUNTER — Encounter: Payer: Self-pay | Admitting: Obstetrics and Gynecology

## 2020-05-10 ENCOUNTER — Telehealth: Payer: Self-pay | Admitting: Primary Care

## 2020-05-10 NOTE — Telephone Encounter (Signed)
Pt called to schedule appt with [facility] for her elbow pain and to get her shingrix vaccine. She states she has already contacted insurance and they state they will cover. Is this okay for her to get when she sees [facility] on 6/2?

## 2020-05-10 NOTE — Telephone Encounter (Signed)
Absolutely.

## 2020-05-13 ENCOUNTER — Ambulatory Visit: Payer: Managed Care, Other (non HMO) | Admitting: Obstetrics

## 2020-05-14 ENCOUNTER — Ambulatory Visit (INDEPENDENT_AMBULATORY_CARE_PROVIDER_SITE_OTHER): Payer: Managed Care, Other (non HMO) | Admitting: Advanced Practice Midwife

## 2020-05-14 ENCOUNTER — Ambulatory Visit: Payer: Managed Care, Other (non HMO) | Admitting: Advanced Practice Midwife

## 2020-05-14 ENCOUNTER — Other Ambulatory Visit: Payer: Self-pay

## 2020-05-14 ENCOUNTER — Encounter: Payer: Self-pay | Admitting: Advanced Practice Midwife

## 2020-05-14 VITALS — BP 124/70 | Ht 65.0 in | Wt 171.0 lb

## 2020-05-14 DIAGNOSIS — N898 Other specified noninflammatory disorders of vagina: Secondary | ICD-10-CM | POA: Diagnosis not present

## 2020-05-14 DIAGNOSIS — N952 Postmenopausal atrophic vaginitis: Secondary | ICD-10-CM | POA: Diagnosis not present

## 2020-05-14 MED ORDER — ESTRADIOL 0.1 MG/GM VA CREA: 1.0000 | TOPICAL_CREAM | VAGINAL | 3 refills | Status: DC

## 2020-05-14 NOTE — Patient Instructions (Signed)
Atrophic Vaginitis  Atrophic vaginitis is a condition in which the tissues that line the vagina become dry and thin. This condition is most common in women who have stopped having regular menstrual periods (are in menopause). This usually starts when a woman is 45-60 years old. That is the time when a woman's estrogen levels begin to drop (decrease). Estrogen is a female hormone. It helps to keep the tissues of the vagina moist. It stimulates the vagina to produce a clear fluid that lubricates the vagina for sexual intercourse. This fluid also protects the vagina from infection. Lack of estrogen can cause the lining of the vagina to get thinner and dryer. The vagina may also shrink in size. It may become less elastic. Atrophic vaginitis tends to get worse over time as a woman's estrogen level drops. What are the causes? This condition is caused by the normal drop in estrogen that happens around the time of menopause. What increases the risk? Certain conditions or situations may lower a woman's estrogen level, leading to a higher risk for atrophic vaginitis. You are more likely to develop this condition if:  You are taking medicines that block estrogen.  You have had your ovaries removed.  You are being treated for cancer with X-ray (radiation) or medicines (chemotherapy).  You have given birth or are breastfeeding.  You are older than age 50.  You smoke. What are the signs or symptoms? Symptoms of this condition include:  Pain, soreness, or bleeding during sexual intercourse (dyspareunia).  Vaginal burning, irritation, or itching.  Pain or bleeding when a speculum is used in a vaginal exam (pelvic exam).  Having burning pain when passing urine.  Vaginal discharge that is brown or yellow. In some cases, there are no symptoms. How is this diagnosed? This condition is diagnosed by taking a medical history and doing a physical exam. This will include a pelvic exam that checks the  vaginal tissues. Though rare, you may also have other tests, including:  A urine test.  A test that checks the acid balance in your vagina (acid balance test). How is this treated? Treatment for this condition depends on how severe your symptoms are. Treatment may include:  Using an over-the-counter vaginal lubricant before sex.  Using a long-acting vaginal moisturizer.  Using low-dose vaginal estrogen for moderate to severe symptoms that do not respond to other treatments. Options include creams, tablets, and inserts (vaginal rings). Before you use a vaginal estrogen, tell your health care provider if you have a history of: ? Breast cancer. ? Endometrial cancer. ? Blood clots. If you are not sexually active and your symptoms are very mild, you may not need treatment. Follow these instructions at home: Medicines  Take over-the-counter and prescription medicines only as told by your health care provider. Do not use herbal or alternative medicines unless your health care provider says that you can.  Use over-the-counter creams, lubricants, or moisturizers for dryness only as directed by your health care provider. General instructions  If your atrophic vaginitis is caused by menopause, discuss all of your menopause symptoms and treatment options with your health care provider.  Do not douche.  Do not use products that can make your vagina dry. These include: ? Scented feminine sprays. ? Scented tampons. ? Scented soaps.  Vaginal intercourse can help to improve blood flow and elasticity of vaginal tissue. If it hurts to have sex, try using a lubricant or moisturizer just before having intercourse. Contact a health care provider if:    Your discharge looks different than normal.  Your vagina has an unusual smell.  You have new symptoms.  Your symptoms do not improve with treatment.  Your symptoms get worse. Summary  Atrophic vaginitis is a condition in which the tissues that  line the vagina become dry and thin. It is most common in women who have stopped having regular menstrual periods (are in menopause).  Treatment options include using vaginal lubricants and low-dose vaginal estrogen.  Contact a health care provider if your vagina has an unusual smell, or if your symptoms get worse or do not improve after treatment. This information is not intended to replace advice given to you by your health care provider. Make sure you discuss any questions you have with your health care provider. Document Revised: 11/16/2017 Document Reviewed: 08/30/2017 Elsevier Patient Education  2020 [employer]  

## 2020-05-16 ENCOUNTER — Encounter: Payer: Self-pay | Admitting: Advanced Practice Midwife

## 2020-05-16 DIAGNOSIS — N952 Postmenopausal atrophic vaginitis: Secondary | ICD-10-CM | POA: Insufficient documentation

## 2020-05-16 NOTE — Progress Notes (Signed)
Date of Service: 05/14/2020   Patient ID: Samantha Hall, female   DOB: 1961/12/16, 59 y.o.   MRN: [id number]  Reason for Consult: Vaginal Discharge (little itchiness/irritation, no odor since Tuesday)    Subjective:  HPI:  Samantha Hall is a 59 y.o. female being seen for recurrent vaginal itching and irritation. She reports symptoms occur every 1-3 months and usually resolve without treatment within 7 days. 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 She used that to try and treat her symptoms recently although her symptoms were not those of HSV outbreak. She reports some clear discharge and no odor. Her last intercourse was on Tuesday last week. She uses KY jelly to avoid dyspareunia. She reports some sensitivities to body care products and denies problem with KY. She had a negative workup for vaginitis about 5 months ago. She has never tried hormone replacement therapy for post menopausal vaginal symptoms.   Wet prep normal today without evidence of yeast or BV. We discussed likely atrophic vaginitis as cause for symptoms. She would like to try vaginal estrogen cream to see if it eases symptoms.   Past Medical History:  Diagnosis Date  . Elevated antinuclear antibody (ANA) level    Negative workup for Lupus.  . Fibroids   . Fracture of left patella 04/30/2018  . Hyperlipidemia   . Hypertension    Controlled  . Osteoarthritis   . Sciatica   . Vitamin D deficiency    Family History  Problem Relation Age of Onset  . Diabetes Mother   . Heart disease Mother   . Heart disease Father    Past Surgical History:  Procedure Laterality Date  . ABDOMINAL HYSTERECTOMY  2002  . CHOLECYSTECTOMY  2015  . TUBAL LIGATION  1984    Short Social History:  Social History   Tobacco Use  . Smoking status: Never Smoker  . Smokeless tobacco: Never Used  Substance Use Topics  . Alcohol use: Yes    Alcohol/week: 0.0 standard drinks    Comment: sociall     Allergies  Allergen Reactions  . Cefazolin Swelling and Anaphylaxis    Difficulty breathing  . Lidocaine Swelling  . Other Rash    Contact rash  . Sulfa Antibiotics Rash    Current Outpatient Medications  Medication Sig Dispense Refill  . amLODipine (NORVASC) 5 MG tablet Take 1 tablet (5 mg total) by mouth daily. DUE FOR A FOLLOW UP APPOINTMENT 90 tablet 0  . aspirin 81 MG EC tablet Take by mouth.    Marland Kitchen b complex vitamins tablet Take 1 tablet by mouth daily.    . Cholecalciferol (VITAMIN D) 2000 units CAPS Take by mouth.    . cyclobenzaprine (FLEXERIL) 10 MG tablet Take 1 tablet (10 mg total) by mouth 2 (two) times daily as needed for muscle spasms. 30 tablet 0  . diclofenac (VOLTAREN) 75 MG EC tablet Take 1 tablet (75 mg total) by mouth 2 (two) times daily as needed for moderate pain. For inflammation/pain. 60 tablet 0  . fexofenadine (ALLEGRA) 180 MG tablet Take 180 mg by mouth daily as needed.    . hydrOXYzine (ATARAX/VISTARIL) 10 MG tablet TAKE 1 CAPSULE BY MOUTH UP TO TWICE DAILY AS NEEDED FOR ANXIETY. 30 tablet 0  . metaxalone (SKELAXIN) 800 MG tablet TAKE 1 TABLET BY MOUTH THREE TIMES A DAY AS NEEDED FOR MUSCLE SPASMS    . SUMAtriptan (IMITREX) 25 MG tablet PLEASE SEE ATTACHED FOR DETAILED DIRECTIONS    .  triamcinolone cream (KENALOG) 0.5 % APPLY TO AFFECTED AREA TWICE A DAY 30 g 0  . TURMERIC PO Take 4 capsules by mouth daily.    Marland Kitchen estradiol (ESTRACE VAGINAL) 0.1 MG/GM vaginal cream Place 1 Applicatorful vaginally 3 (three) times a week. 42.5 g 3   No current facility-administered medications for this visit.    Review of Systems  Constitutional: Negative for chills and fever.  HENT: Negative for congestion, ear discharge, ear pain, hearing loss, sinus pain and sore throat.   Eyes: Negative for blurred vision and double vision.  Respiratory: Negative for cough, shortness of breath and wheezing.   Cardiovascular: Negative for chest pain, palpitations and leg swelling.   Gastrointestinal: Negative for abdominal pain, blood in stool, constipation, diarrhea, heartburn, melena, nausea and vomiting.  Genitourinary: Negative for dysuria, flank pain, frequency, hematuria and urgency.       Positive for vaginal itching and irritation  Musculoskeletal: Negative for back pain, joint pain and myalgias.  Skin: Negative for itching and rash.  Neurological: Negative for dizziness, tingling, tremors, sensory change, speech change, focal weakness, seizures, loss of consciousness, weakness and headaches.  Endo/Heme/Allergies: Negative for environmental allergies. Does not bruise/bleed easily.  Psychiatric/Behavioral: Negative for depression, hallucinations, memory loss, substance abuse and suicidal ideas. The patient is not nervous/anxious and does not have insomnia.         Objective:  Objective   Vitals:   05/14/20 1614  BP: 124/70  Weight: 171 lb (77.6 kg)  Height: 5\' 5"  (1.651 m)   Body mass index is 28.46 kg/m. Vital Signs: BP 124/70   Ht 5\' 5"  (1.651 m)   Wt 171 lb (77.6 kg)   BMI 28.46 kg/m  Constitutional: Well nourished, well developed female in no acute distress.  HEENT: normal Skin: Warm and dry.  Cardiovascular: Regular rate and rhythm.   Extremity: no edema  Respiratory: Clear to auscultation bilateral. Normal respiratory effort Abdomen: soft, nontender, nondistended, no abnormal masses, no epigastric pain Back: no CVAT Neuro: DTRs 2+, Cranial nerves grossly intact Psych: Alert and Oriented x3. No memory deficits. Normal mood and affect.  MS: normal gait, normal bilateral lower extremity ROM/strength/stability.  Pelvic exam:  is not limited by body habitus EGBUS: within normal limits Vagina: within normal limits and with thinning/pale vaginal mucosa, some clear discharge Cervix: not evaluated   Data: Wet Prep: negative for yeast, clue cells, whiff     Assessment/Plan:     59 y.o. post menopausal female with likely atrophic vaginitis   Rx Estrace Follow up as needed   [facility] 05/16/2020, 3:59 PM

## 2020-05-19 ENCOUNTER — Other Ambulatory Visit: Payer: Self-pay

## 2020-05-19 ENCOUNTER — Ambulatory Visit: Payer: Managed Care, Other (non HMO) | Admitting: Family Medicine

## 2020-05-19 ENCOUNTER — Encounter: Payer: Self-pay | Admitting: Family Medicine

## 2020-05-19 VITALS — BP 130/60 | HR 83 | Temp 97.8°F | Ht 65.0 in | Wt 171.5 lb

## 2020-05-19 DIAGNOSIS — M7712 Lateral epicondylitis, left elbow: Secondary | ICD-10-CM

## 2020-05-19 DIAGNOSIS — Z23 Encounter for immunization: Secondary | ICD-10-CM

## 2020-05-19 NOTE — Progress Notes (Signed)
Dwain Huhn T. Victorio Creeden, MD, [facility]  [facility] [place] [facility] [place], [address]  Phone: [phone]  FAX: [phone]  Samantha Hall - 59 y.o. female  MRN [id number]  Date of Birth: 1961/09/19  Date: 05/19/2020  PCP: Pleas Koch, NP  Referral: Pleas Koch, NP  Chief Complaint  Patient presents with  . Elbow Pain    Left    This visit occurred during the SARS-CoV-2 public health emergency.  Safety protocols were in place, including screening questions prior to the visit, additional usage of staff PPE, and extensive cleaning of exam room while observing appropriate contact time as indicated for disinfecting solutions.   Subjective:   Samantha Hall presents with lateral elbow pain.  Length of symptoms: Several weeks Hand effected: Left  Patient describes a dull ache on the lateral elbow. There is some translation in the proximal forearm and in the distal upper arm. It is painful to lift with the hand facing down and to lift with the thumb in an upright position. Supination is painful. Patient points to the lateral epicondyle as the point of maximal tenderness near ECRB.  No trauma.   No prior fractures or operative interventions in the effective hand. Prior PT or HEP: none  Denies numbness or tingling. No significant neck or shoulder pain.  Hand of dominance: Right   Review of Systems is noted in the HPI, as appropriate  Objective:   Blood pressure 130/60, pulse 83, temperature 97.8 F (36.6 C), temperature source Temporal, height 5\' 5"  (1.651 m), weight 171 lb 8 oz (77.8 kg), SpO2 98 %.  GEN: No acute distress; alert,appropriate. PULM: Breathing comfortably in no respiratory distress PSYCH: Normally interactive.   Elbow: L Ecchymosis or edema: neg ROM: full flexion, extension, pronation, supination Shoulder ROM: Full Flexion: 5/5 Extension: 5/5, PAINFUL  Supination: 5/5, PAINFUL Pronation: 5/5 Wrist ext: 5/5 Wrist flexion: 5/5 No gross bony abnormality Varus and Valgus stress: stable ECRB tenderness: YES, TTP Medial epicondyle: NT Lateral epicondyle, resisted wrist extension from wrist full pronation and flexion: PAINFUL grip: 5/5  sensation intact Tinel's, Elbow: negative  Subjective:     ICD-10-CM   1. Lateral epicondylitis of left elbow  M77.12   2. Need for shingles vaccine  Z23 Varicella-zoster vaccine IM (Shingrix)    Level of Medical Decision-Making in this case is MODERATE.  Classic presentation.  Pt. given a formal rehab program from [facility] on elbow rehabiliation.  Start off with isometrics and gentle stretching and ROM progressing to a series of concentric and eccentric exercises should be done starting with no weight, work up to 1 lb, hammer, etc.  Use counterforce strap if working or using hands.  Formal PT would be beneficial if symptoms persist. Emphasized stretching an cross-friction massage Emphasized proper palms up lifting biomechanics to unload ECRB  Follow-up: Return in about 6 weeks (around 06/30/2020) for But ok to not come if you are better.  No orders of the defined types were placed in this encounter.  Orders Placed This Encounter  Procedures  . Varicella-zoster vaccine IM (Shingrix)    Signed,  Rhiannan Kievit T. Jerusalen Mateja, MD   Patient's Medications  New Prescriptions   No medications on file  Previous Medications   AMLODIPINE (NORVASC) 5 MG TABLET    Take 1 tablet (5 mg total) by mouth daily. DUE FOR A FOLLOW UP APPOINTMENT   ASPIRIN 81 MG EC TABLET    Take  by mouth.   B COMPLEX VITAMINS TABLET    Take 1 tablet by mouth daily.   CHOLECALCIFEROL (VITAMIN D) 2000 UNITS CAPS    Take by mouth.   CYCLOBENZAPRINE (FLEXERIL) 10 MG TABLET    Take 1 tablet (10 mg total) by mouth 2 (two) times daily as needed for muscle spasms.   DICLOFENAC (VOLTAREN) 75 MG EC TABLET    Take 1 tablet (75 mg total) by  mouth 2 (two) times daily as needed for moderate pain. For inflammation/pain.   ESTRADIOL (ESTRACE VAGINAL) 0.1 MG/GM VAGINAL CREAM    Place 1 Applicatorful vaginally 3 (three) times a week.   FEXOFENADINE (ALLEGRA) 180 MG TABLET    Take 180 mg by mouth daily as needed.   HYDROXYZINE (ATARAX/VISTARIL) 10 MG TABLET    TAKE 1 CAPSULE BY MOUTH UP TO TWICE DAILY AS NEEDED FOR ANXIETY.   METAXALONE (SKELAXIN) 800 MG TABLET    TAKE 1 TABLET BY MOUTH THREE TIMES A DAY AS NEEDED FOR MUSCLE SPASMS   SUMATRIPTAN (IMITREX) 25 MG TABLET    PLEASE SEE ATTACHED FOR DETAILED DIRECTIONS   TRIAMCINOLONE CREAM (KENALOG) 0.5 %    APPLY TO AFFECTED AREA TWICE A DAY   TURMERIC PO    Take 4 capsules by mouth daily.  Modified Medications   No medications on file  Discontinued Medications   No medications on file

## 2020-05-19 NOTE — Patient Instructions (Signed)
Voltaren 1% gel. Over the counter You can apply up to 4 times a day Minimal is absorbed in the bloodstream Cost is about 9 dollars 

## 2020-05-21 ENCOUNTER — Encounter: Payer: Self-pay | Admitting: Family Medicine

## 2020-06-18 ENCOUNTER — Other Ambulatory Visit: Payer: Self-pay

## 2020-06-18 ENCOUNTER — Ambulatory Visit (INDEPENDENT_AMBULATORY_CARE_PROVIDER_SITE_OTHER): Payer: Managed Care, Other (non HMO) | Admitting: Primary Care

## 2020-06-18 ENCOUNTER — Encounter: Payer: Self-pay | Admitting: Primary Care

## 2020-06-18 DIAGNOSIS — M545 Low back pain, unspecified: Secondary | ICD-10-CM

## 2020-06-18 DIAGNOSIS — G8929 Other chronic pain: Secondary | ICD-10-CM

## 2020-06-18 NOTE — Patient Instructions (Signed)
Have your FMLA forms send to [phone], attention to [facility].  Please check back with me Tuesday afternoon.  It was a pleasure to see you today!

## 2020-06-18 NOTE — Assessment & Plan Note (Addendum)
Following with chiropractor as needed, requesting extension/renewal of FMLA.  Agree to renew FMLA with flares occurring 1-2 times monthly lasting 8 hours. She will have her HR department send paperwork. Fax number provided.   Continue PRN diclofenac and cyclobenzaprine

## 2020-06-18 NOTE — Progress Notes (Signed)
Subjective:    Patient ID: Samantha Hall, female    DOB: May 07, 1961, 59 y.o.   MRN: [id number]  HPI  This visit occurred during the SARS-CoV-2 public health emergency.  Safety protocols were in place, including screening questions prior to the visit, additional usage of staff PPE, and extensive cleaning of exam room while observing appropriate contact time as indicated for disinfecting solutions.   Samantha Hall is a 59 year old female with a history of hypertension, chronic low back pain, prediabetes, bilateral hand pain, constipation who presents today requesting an extension of FMLA.  She is managed on cyclobenzaprine and diclofenac for which she takes as needed. Following with a chiropractor with last visit being last week.   She would like to extend her FMLA in regards to her chronic back pain. She's also now bothered by her lower neck which is causing headaches. She is working to adjust her posture, desk chair, and pillows to prevent flraes. She typically has once monthly lower back flares where she needs her FMLA, sometimes every other month. She is requesting intermittent FMLA for an 8 hour day lasting 1-2 days for flares.  Her most recent flare began on June 22nd and resolved on June 29th. She had to take off from work on June 24th and 25th last week, she did go for a chiropractic adjustment this week and last week and is doing better.   Review of Systems  Musculoskeletal: Positive for arthralgias, back pain and myalgias.  Skin: Negative for color change.  Neurological: Positive for headaches.       Past Medical History:  Diagnosis Date  . Elevated antinuclear antibody (ANA) level    Negative workup for Lupus.  . Fibroids   . Fracture of left patella 04/30/2018  . Hyperlipidemia   . Hypertension    Controlled  . Osteoarthritis   . Sciatica   . Vitamin D deficiency      Social History   Socioeconomic History  . Marital status: Married    Spouse name: Not on file    . Number of children: Not on file  . Years of education: Not on file  . Highest education level: Not on file  Occupational History  . Not on file  Tobacco Use  . Smoking status: Never Smoker  . Smokeless tobacco: Never Used  Vaping Use  . Vaping Use: Never used  Substance and Sexual Activity  . Alcohol use: Yes    Alcohol/week: 0.0 standard drinks    Comment: sociall  . Drug use: No  . Sexual activity: Yes    Birth control/protection: Surgical    Comment: Hysterectomy  Other Topics Concern  . Not on file  Social History Narrative   Married.   Highest level of education Bachelors.    Works as a Education officer, museum.   Social Determinants of Health   Financial Resource Strain:   . Difficulty of Paying Living Expenses:   Food Insecurity:   . Worried About Charity fundraiser in the Last Year:   . Arboriculturist in the Last Year:   Transportation Needs:   . Film/video editor (Medical):   Marland Kitchen Lack of Transportation (Non-Medical):   Physical Activity:   . Days of Exercise per Week:   . Minutes of Exercise per Session:   Stress:   . Feeling of Stress :   Social Connections:   . Frequency of Communication with Friends and Family:   . Frequency of Social Gatherings with  Friends and Family:   . Attends Religious Services:   . Active Member of Clubs or Organizations:   . Attends Archivist Meetings:   Marland Kitchen Marital Status:   Intimate Partner Violence:   . Fear of Current or Ex-Partner:   . Emotionally Abused:   Marland Kitchen Physically Abused:   . Sexually Abused:     Past Surgical History:  Procedure Laterality Date  . ABDOMINAL HYSTERECTOMY  2002  . CHOLECYSTECTOMY  2015  . TUBAL LIGATION  1984    Family History  Problem Relation Age of Onset  . Diabetes Mother   . Heart disease Mother   . Heart disease Father     Allergies  Allergen Reactions  . Cefazolin Swelling and Anaphylaxis    Difficulty breathing  . Lidocaine Swelling  . Other Rash    Contact rash  .  Sulfa Antibiotics Rash    Current Outpatient Medications on File Prior to Visit  Medication Sig Dispense Refill  . amLODipine (NORVASC) 5 MG tablet Take 1 tablet (5 mg total) by mouth daily. DUE FOR A FOLLOW UP APPOINTMENT 90 tablet 0  . aspirin 81 MG EC tablet Take by mouth.    Marland Kitchen b complex vitamins tablet Take 1 tablet by mouth daily.    . Cholecalciferol (VITAMIN D) 2000 units CAPS Take by mouth.    . cyclobenzaprine (FLEXERIL) 10 MG tablet Take 1 tablet (10 mg total) by mouth 2 (two) times daily as needed for muscle spasms. 30 tablet 0  . diclofenac (VOLTAREN) 75 MG EC tablet Take 1 tablet (75 mg total) by mouth 2 (two) times daily as needed for moderate pain. For inflammation/pain. 60 tablet 0  . estradiol (ESTRACE VAGINAL) 0.1 MG/GM vaginal cream Place 1 Applicatorful vaginally 3 (three) times a week. 42.5 g 3  . fexofenadine (ALLEGRA) 180 MG tablet Take 180 mg by mouth daily as needed.    . hydrOXYzine (ATARAX/VISTARIL) 10 MG tablet TAKE 1 CAPSULE BY MOUTH UP TO TWICE DAILY AS NEEDED FOR ANXIETY. 30 tablet 0  . metaxalone (SKELAXIN) 800 MG tablet TAKE 1 TABLET BY MOUTH THREE TIMES A DAY AS NEEDED FOR MUSCLE SPASMS    . SUMAtriptan (IMITREX) 25 MG tablet PLEASE SEE ATTACHED FOR DETAILED DIRECTIONS    . triamcinolone cream (KENALOG) 0.5 % APPLY TO AFFECTED AREA TWICE A DAY 30 g 0  . TURMERIC PO Take 4 capsules by mouth daily.     No current facility-administered medications on file prior to visit.    BP 124/78   Pulse 65   Temp (!) 96.4 F (35.8 C) (Temporal)   Ht 5\' 5"  (1.651 m)   Wt 174 lb (78.9 kg)   SpO2 98%   BMI 28.96 kg/m    Objective:   Physical Exam Cardiovascular:     Rate and Rhythm: Normal rate and regular rhythm.  Pulmonary:     Effort: Pulmonary effort is normal.     Breath sounds: Normal breath sounds.  Musculoskeletal:     Lumbar back: Decreased range of motion.       Back:     Comments: Reduction in lumbar spine range of motion due to pain. Improving  per patient.  Neurological:     Mental Status: She is alert.            Assessment & Plan:

## 2020-06-23 ENCOUNTER — Encounter: Payer: Self-pay | Admitting: Primary Care

## 2020-06-23 LAB — HM COLONOSCOPY

## 2020-06-24 ENCOUNTER — Encounter: Payer: Self-pay | Admitting: Primary Care

## 2020-06-30 ENCOUNTER — Ambulatory Visit: Payer: Managed Care, Other (non HMO) | Admitting: Family Medicine

## 2020-07-10 ENCOUNTER — Other Ambulatory Visit: Payer: Self-pay | Admitting: Primary Care

## 2020-07-21 ENCOUNTER — Ambulatory Visit (INDEPENDENT_AMBULATORY_CARE_PROVIDER_SITE_OTHER): Payer: Managed Care, Other (non HMO)

## 2020-07-21 ENCOUNTER — Other Ambulatory Visit: Payer: Self-pay

## 2020-07-21 DIAGNOSIS — Z23 Encounter for immunization: Secondary | ICD-10-CM | POA: Diagnosis not present

## 2020-08-16 DIAGNOSIS — J3089 Other allergic rhinitis: Secondary | ICD-10-CM

## 2020-08-17 MED ORDER — OLOPATADINE HCL 0.1 % OP SOLN: 1.0000 [drp] | Freq: Two times a day (BID) | OPHTHALMIC | 0 refills | Status: DC | PRN

## 2020-08-26 ENCOUNTER — Telehealth: Payer: Self-pay

## 2020-08-26 NOTE — Telephone Encounter (Signed)
Pt calling stating her BP is ok when getting up in the mornings.  However, it goes up as the day goes on.  She has also c/o HA and feeling cold whem BP goes up.  Yesterday BP was upper 140s-150s/60s-90.  Pt took HCTZ and aspirin.  Before bed BP was 147/90.  This morning, 119/78 when getting up.  By 12:30, BP was 155/90 and at 2:30, 169/60.

## 2020-08-27 NOTE — Telephone Encounter (Signed)
HCTZ no longer on medication list, please verify which dose she has at home. Has she been taking this daily or is she just taking "as needed". Is she taking her amlodipine 5 mg everyday?

## 2020-08-27 NOTE — Telephone Encounter (Signed)
HCTZ 12.5 as needed. She took an aspirin as well.  Sometimes her pressure spikes usually when she is stressed but by the afternoon she will get a headache and tingling in face.  Pressure while on the phone with patient was 152/87.  Advised patient we will be in touch with further direction.  Advised stroke risk and to go to ER if needed.

## 2020-08-27 NOTE — Telephone Encounter (Signed)
Informed patient of Clearence Cheek message.  She understands.

## 2020-08-27 NOTE — Telephone Encounter (Signed)
Please have patient resume her hydrochlorothiazide 12.5 mg daily for now.  Continue amlodipine 5 mg. Blood pressure will fluctuate throughout the day, we need to ensure that it returns to a normal level (less than 130/90).  If she finds that her blood pressure is not returning to normal level then we need to meet.  She will need to go to the hospital if she develops unilateral weakness, tingling, change in speech, blurred vision, dizziness.  Have her monitor her blood pressure over the weekend and to schedule a follow-up visit with me for next week if blood pressure does not stabilize.

## 2020-09-09 ENCOUNTER — Other Ambulatory Visit: Payer: Self-pay | Admitting: Primary Care

## 2020-09-09 DIAGNOSIS — J3089 Other allergic rhinitis: Secondary | ICD-10-CM

## 2020-09-10 NOTE — Telephone Encounter (Signed)
This may be an auto fill, will you find out if she needs it>? Okay to refill if she does.

## 2020-09-10 NOTE — Telephone Encounter (Signed)
Is this something you give refills for or do they need to follow up with eye dr?

## 2020-09-13 NOTE — Telephone Encounter (Signed)
Called pt does not need refilled. I have declined per patient request.

## 2020-09-30 NOTE — Telephone Encounter (Signed)
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 She will pick up later today.

## 2020-10-05 ENCOUNTER — Ambulatory Visit: Payer: Managed Care, Other (non HMO) | Admitting: Primary Care

## 2020-10-18 DIAGNOSIS — N289 Disorder of kidney and ureter, unspecified: Secondary | ICD-10-CM

## 2020-10-18 DIAGNOSIS — K7689 Other specified diseases of liver: Secondary | ICD-10-CM

## 2020-10-20 ENCOUNTER — Telehealth: Payer: Self-pay | Admitting: *Deleted

## 2020-10-20 DIAGNOSIS — N289 Disorder of kidney and ureter, unspecified: Secondary | ICD-10-CM

## 2020-10-20 DIAGNOSIS — K7689 Other specified diseases of liver: Secondary | ICD-10-CM

## 2020-10-20 NOTE — Telephone Encounter (Signed)
Fatina from [facility] called stating that the provider has order two different ultrasounds. Samson Frederic stated that the ultrasound abdominal right upper quadrant would show the liver and gallbladder. The ultrasound renal would show the kidneys and bladder. Samson Frederic wants to know if provider would like to order an abdominal complete which would show all listed on the ultrasounds except the bladder.Samson Frederic stated if provider does not need the bladder than she could just order the abdominal complete. Fatina requested a call back if provider wants both ultrasounds so that it would include the bladder.

## 2020-10-20 NOTE — Telephone Encounter (Signed)
Noted, order changed to abdominal complete as we do not need to see the bladder.

## 2020-10-20 NOTE — Telephone Encounter (Signed)
Orders for US Renal and [place] RUQ cancelled.  See TE 10/20/20

## 2020-10-21 ENCOUNTER — Other Ambulatory Visit: Payer: Self-pay | Admitting: Primary Care

## 2020-10-21 DIAGNOSIS — E559 Vitamin D deficiency, unspecified: Secondary | ICD-10-CM

## 2020-10-21 DIAGNOSIS — E785 Hyperlipidemia, unspecified: Secondary | ICD-10-CM

## 2020-10-21 DIAGNOSIS — R7303 Prediabetes: Secondary | ICD-10-CM

## 2020-10-29 ENCOUNTER — Ambulatory Visit: Payer: Managed Care, Other (non HMO) | Attending: Internal Medicine

## 2020-10-29 DIAGNOSIS — Z23 Encounter for immunization: Secondary | ICD-10-CM

## 2020-10-29 NOTE — Progress Notes (Signed)
   Covid-19 Vaccination Clinic  Name:  Samantha Hall    MRN: [id number] DOB: 03/06/1961  10/29/2020  Samantha Hall was observed post Covid-19 immunization for 15 minutes without incident. She was provided with Vaccine Information Sheet and instruction to access the V-Safe system.   Samantha Hall was instructed to call 911 with any severe reactions post vaccine: Marland Kitchen Difficulty breathing  . Swelling of face and throat  . A fast heartbeat  . A bad rash all over body  . Dizziness and weakness

## 2020-11-01 ENCOUNTER — Other Ambulatory Visit (INDEPENDENT_AMBULATORY_CARE_PROVIDER_SITE_OTHER): Payer: Managed Care, Other (non HMO)

## 2020-11-01 ENCOUNTER — Ambulatory Visit
Admission: RE | Admit: 2020-11-01 | Discharge: 2020-11-01 | Disposition: A | Discharge status: Home / Self Care | Payer: Managed Care, Other (non HMO) | Source: Ambulatory Visit | Attending: Primary Care | Admitting: Primary Care

## 2020-11-01 ENCOUNTER — Other Ambulatory Visit: Payer: Self-pay

## 2020-11-01 DIAGNOSIS — K7689 Other specified diseases of liver: Secondary | ICD-10-CM

## 2020-11-01 DIAGNOSIS — R7303 Prediabetes: Secondary | ICD-10-CM

## 2020-11-01 DIAGNOSIS — N289 Disorder of kidney and ureter, unspecified: Secondary | ICD-10-CM

## 2020-11-01 DIAGNOSIS — E559 Vitamin D deficiency, unspecified: Secondary | ICD-10-CM | POA: Diagnosis not present

## 2020-11-01 DIAGNOSIS — E785 Hyperlipidemia, unspecified: Secondary | ICD-10-CM

## 2020-11-01 LAB — LIPID PANEL
Cholesterol: 244 mg/dL — ABNORMAL HIGH (ref 0–200)
HDL: 86.5 mg/dL (ref >39.00)
LDL Cholesterol: 141 mg/dL — ABNORMAL HIGH (ref 0–99)
NonHDL: 157.36
Total CHOL/HDL Ratio: 3
Triglycerides: 82 mg/dL (ref 0.0–149.0)
VLDL: 16.4 mg/dL (ref 0.0–40.0)

## 2020-11-01 LAB — HEMOGLOBIN A1C: Hgb A1c MFr Bld: 6.2 % (ref 4.6–6.5)

## 2020-11-01 LAB — VITAMIN D 25 HYDROXY (VIT D DEFICIENCY, FRACTURES): VITD: 40.87 ng/mL (ref 30.00–100.00)

## 2020-12-07 ENCOUNTER — Other Ambulatory Visit: Payer: Self-pay | Admitting: Primary Care

## 2020-12-08 DIAGNOSIS — I10 Essential (primary) hypertension: Secondary | ICD-10-CM

## 2020-12-08 MED ORDER — HYDROCHLOROTHIAZIDE 12.5 MG PO TABS: 12.5000 mg | ORAL_TABLET | Freq: Every day | ORAL | 1 refills | Status: DC

## 2020-12-08 NOTE — Telephone Encounter (Signed)
Called patient states she has been taking. Restarted in September has been taking everyday until she ran out recently. States that when on bp were in normal range average of 120/70. Has been out for last few days and bp has been around 156/81. She is having same symptoms of elevated bp as in the past. Tingling in face. Reviewed all red words with her. Denies any chest pain, sob , changes in vision. Aware if stats to have any red words she is to go to ED.   Ok to call in refill to [facility] in [place]? When did you want her to come in for follow up?

## 2020-12-08 NOTE — Telephone Encounter (Signed)
Needs CPE in May 2022.

## 2020-12-30 ENCOUNTER — Other Ambulatory Visit: Payer: Managed Care, Other (non HMO)

## 2021-03-11 ENCOUNTER — Telehealth: Payer: Self-pay | Admitting: Primary Care

## 2021-03-11 NOTE — Telephone Encounter (Signed)
Patient is requesting labs prior to Physical in Mychart message   Test results; I would like to complete blood work before the visit to have results at the annual physical to discuss

## 2021-03-11 NOTE — Telephone Encounter (Signed)
Noted, I do not see a physical scheduled as of yet. As FYI, I am always happy to accommodate this request from patient's, okay to schedule those patients for a lab appointment.

## 2021-04-06 ENCOUNTER — Other Ambulatory Visit: Payer: Self-pay | Admitting: Primary Care

## 2021-04-06 DIAGNOSIS — R7303 Prediabetes: Secondary | ICD-10-CM

## 2021-04-06 DIAGNOSIS — E785 Hyperlipidemia, unspecified: Secondary | ICD-10-CM

## 2021-04-06 DIAGNOSIS — E559 Vitamin D deficiency, unspecified: Secondary | ICD-10-CM

## 2021-04-06 DIAGNOSIS — I10 Essential (primary) hypertension: Secondary | ICD-10-CM

## 2021-04-21 ENCOUNTER — Other Ambulatory Visit: Payer: Managed Care, Other (non HMO)

## 2021-04-25 ENCOUNTER — Other Ambulatory Visit (INDEPENDENT_AMBULATORY_CARE_PROVIDER_SITE_OTHER): Payer: Managed Care, Other (non HMO)

## 2021-04-25 ENCOUNTER — Other Ambulatory Visit: Payer: Self-pay

## 2021-04-25 DIAGNOSIS — E785 Hyperlipidemia, unspecified: Secondary | ICD-10-CM | POA: Diagnosis not present

## 2021-04-25 DIAGNOSIS — I10 Essential (primary) hypertension: Secondary | ICD-10-CM | POA: Diagnosis not present

## 2021-04-25 DIAGNOSIS — E559 Vitamin D deficiency, unspecified: Secondary | ICD-10-CM | POA: Diagnosis not present

## 2021-04-25 DIAGNOSIS — R7303 Prediabetes: Secondary | ICD-10-CM | POA: Diagnosis not present

## 2021-04-25 LAB — CBC
HCT: 39.1 % (ref 36.0–46.0)
Hemoglobin: 13.2 g/dL (ref 12.0–15.0)
MCHC: 33.8 g/dL (ref 30.0–36.0)
MCV: 91.8 fl (ref 78.0–100.0)
Platelets: 262 10*3/uL (ref 150.0–400.0)
RBC: 4.26 Mil/uL (ref 3.87–5.11)
RDW: 13.8 % (ref 11.5–15.5)
WBC: 4 10*3/uL (ref 4.0–10.5)

## 2021-04-25 LAB — COMPREHENSIVE METABOLIC PANEL
ALT: 55 U/L — ABNORMAL HIGH (ref 0–35)
AST: 45 U/L — ABNORMAL HIGH (ref 0–37)
Albumin: 4.3 g/dL (ref 3.5–5.2)
Alkaline Phosphatase: 89 U/L (ref 39–117)
BUN: 19 mg/dL (ref 6–23)
CO2: 30 mEq/L (ref 19–32)
Calcium: 9.4 mg/dL (ref 8.4–10.5)
Chloride: 104 mEq/L (ref 96–112)
Creatinine, Ser: 1 mg/dL (ref 0.40–1.20)
GFR: 61.07 mL/min (ref >60.00)
Glucose, Bld: 99 mg/dL (ref 70–99)
Potassium: 4.1 mEq/L (ref 3.5–5.1)
Sodium: 139 mEq/L (ref 135–145)
Total Bilirubin: 0.6 mg/dL (ref 0.2–1.2)
Total Protein: 7.3 g/dL (ref 6.0–8.3)

## 2021-04-25 LAB — LIPID PANEL
Cholesterol: 220 mg/dL — ABNORMAL HIGH (ref 0–200)
HDL: 83.8 mg/dL (ref >39.00)
LDL Cholesterol: 126 mg/dL — ABNORMAL HIGH (ref 0–99)
NonHDL: 136
Total CHOL/HDL Ratio: 3
Triglycerides: 51 mg/dL (ref 0.0–149.0)
VLDL: 10.2 mg/dL (ref 0.0–40.0)

## 2021-04-25 LAB — VITAMIN D 25 HYDROXY (VIT D DEFICIENCY, FRACTURES): VITD: 36.77 ng/mL (ref 30.00–100.00)

## 2021-04-25 LAB — HEMOGLOBIN A1C: Hgb A1c MFr Bld: 6 % (ref 4.6–6.5)

## 2021-04-28 ENCOUNTER — Other Ambulatory Visit: Payer: Self-pay

## 2021-04-28 ENCOUNTER — Ambulatory Visit (INDEPENDENT_AMBULATORY_CARE_PROVIDER_SITE_OTHER): Payer: Managed Care, Other (non HMO) | Admitting: Primary Care

## 2021-04-28 ENCOUNTER — Encounter: Payer: Self-pay | Admitting: Primary Care

## 2021-04-28 VITALS — BP 118/70 | HR 89 | Temp 96.7°F | Ht 64.5 in | Wt 172.0 lb

## 2021-04-28 DIAGNOSIS — J3089 Other allergic rhinitis: Secondary | ICD-10-CM | POA: Diagnosis not present

## 2021-04-28 DIAGNOSIS — M545 Low back pain, unspecified: Secondary | ICD-10-CM

## 2021-04-28 DIAGNOSIS — I10 Essential (primary) hypertension: Secondary | ICD-10-CM

## 2021-04-28 DIAGNOSIS — R7989 Other specified abnormal findings of blood chemistry: Secondary | ICD-10-CM

## 2021-04-28 DIAGNOSIS — K769 Liver disease, unspecified: Secondary | ICD-10-CM

## 2021-04-28 DIAGNOSIS — Z0000 Encounter for general adult medical examination without abnormal findings: Secondary | ICD-10-CM

## 2021-04-28 DIAGNOSIS — E785 Hyperlipidemia, unspecified: Secondary | ICD-10-CM

## 2021-04-28 DIAGNOSIS — N952 Postmenopausal atrophic vaginitis: Secondary | ICD-10-CM | POA: Diagnosis not present

## 2021-04-28 DIAGNOSIS — M1991 Primary osteoarthritis, unspecified site: Secondary | ICD-10-CM

## 2021-04-28 DIAGNOSIS — R7303 Prediabetes: Secondary | ICD-10-CM

## 2021-04-28 DIAGNOSIS — E559 Vitamin D deficiency, unspecified: Secondary | ICD-10-CM

## 2021-04-28 DIAGNOSIS — K5900 Constipation, unspecified: Secondary | ICD-10-CM

## 2021-04-28 DIAGNOSIS — F418 Other specified anxiety disorders: Secondary | ICD-10-CM

## 2021-04-28 DIAGNOSIS — G8929 Other chronic pain: Secondary | ICD-10-CM

## 2021-04-28 MED ORDER — OLOPATADINE HCL 0.1 % OP SOLN: 1.0000 [drp] | Freq: Two times a day (BID) | OPHTHALMIC | 1 refills | Status: DC | PRN

## 2021-04-28 NOTE — Assessment & Plan Note (Signed)
No longer on diclofenac or cyclobenzaprine, doing well on essential oils. Continue to monitor.

## 2021-04-28 NOTE — Assessment & Plan Note (Signed)
Improved on recent labs. Continue to work on diet, increase exercise.

## 2021-04-28 NOTE — Assessment & Plan Note (Addendum)
Well controlled in the office today, she has noticed some ankle edema over the last several months. Will have her hold amlodipine 5 mg, and resume HCTZ 12.5 mg daily.    She will update me on My Chart.   CMP reviewed.

## 2021-04-28 NOTE — Assessment & Plan Note (Signed)
Immunizations UTD. Mammogram UTD, see care everywhere. Colonoscopy UTD, due in 2031.  Discussed the importance of a healthy diet and regular exercise in order for weight loss, and to reduce the risk of any potential medical problems.  Exam today stable. Labs reviewed.

## 2021-04-28 NOTE — Assessment & Plan Note (Signed)
Stable on recent labs, continue to monitor.  

## 2021-04-28 NOTE — Assessment & Plan Note (Signed)
Noted again on recent labs, less than 3x the upper limits of normal.   Ultrasound from November 2021 reviewed, will repeat again in November 2022.  Also checking Hepatitis panel today. Negative hep c.

## 2021-04-28 NOTE — Assessment & Plan Note (Signed)
Intermittent, overall manages well on her own. No recent use of hydroxyzine.   Continue to monitor.

## 2021-04-28 NOTE — Assessment & Plan Note (Addendum)
Recent level improved, not currently taking vitamin D. Recommended to start vitamin D daily.

## 2021-04-28 NOTE — Assessment & Plan Note (Signed)
Doing well on Estrace cream per GYN. Continue same.

## 2021-04-28 NOTE — Assessment & Plan Note (Signed)
Improved, no longer on Flexeril or diclofenac. Continue walking, essential oils. Continue to monitor.

## 2021-04-28 NOTE — Patient Instructions (Signed)
Stop by the lab prior to leaving today. I will notify you of your results once received.   We will repeat your liver ultrasound again in November.  It was a pleasure to see you today!   Preventive Care 23-60 Years Old, Female Preventive care refers to lifestyle choices and visits with your health care provider that can promote health and wellness. This includes:  A yearly physical exam. This is also called an annual wellness visit.  Regular dental and eye exams.  Immunizations.  Screening for certain conditions.  Healthy lifestyle choices, such as: ? Eating a healthy diet. ? Getting regular exercise. ? Not using drugs or products that contain nicotine and tobacco. ? Limiting alcohol use. What can I expect for my preventive care visit? Physical exam Your health care provider will check your:  Height and weight. These may be used to calculate your BMI (body mass index). BMI is a measurement that tells if you are at a healthy weight.  Heart rate and blood pressure.  Body temperature.  Skin for abnormal spots. Counseling Your health care provider may ask you questions about your:  Past medical problems.  Family's medical history.  Alcohol, tobacco, and drug use.  Emotional well-being.  Home life and relationship well-being.  Sexual activity.  Diet, exercise, and sleep habits.  Work and work Statistician.  Access to firearms.  Method of birth control.  Menstrual cycle.  Pregnancy history. What immunizations do I need? Vaccines are usually given at various ages, according to a schedule. Your health care provider will recommend vaccines for you based on your age, medical history, and lifestyle or other factors, such as travel or where you work.   What tests do I need? Blood tests  Lipid and cholesterol levels. These may be checked every 5 years, or more often if you are over 1 years old.  Hepatitis C test.  Hepatitis B test. Screening  Lung cancer  screening. You may have this screening every year starting at age 5 if you have a 30-pack-year history of smoking and currently smoke or have quit within the past 15 years.  Colorectal cancer screening. ? All adults should have this screening starting at age 22 and continuing until age 31. ? Your health care provider may recommend screening at age 17 if you are at increased risk. ? You will have tests every 1-10 years, depending on your results and the type of screening test.  Diabetes screening. ? This is done by checking your blood sugar (glucose) after you have not eaten for a while (fasting). ? You may have this done every 1-3 years.  Mammogram. ? This may be done every 1-2 years. ? Talk with your health care provider about when you should start having regular mammograms. This may depend on whether you have a family history of breast cancer.  BRCA-related cancer screening. This may be done if you have a family history of breast, ovarian, tubal, or peritoneal cancers.  Pelvic exam and Pap test. ? This may be done every 3 years starting at age 89. ? Starting at age 30, this may be done every 5 years if you have a Pap test in combination with an HPV test. Other tests  STD (sexually transmitted disease) testing, if you are at risk.  Bone density scan. This is done to screen for osteoporosis. You may have this scan if you are at high risk for osteoporosis. Talk with your health care provider about your test results, treatment options, and  if necessary, the need for more tests. Follow these instructions at home: Eating and drinking  Eat a diet that includes fresh fruits and vegetables, whole grains, lean protein, and low-fat dairy products.  Take vitamin and mineral supplements as recommended by your health care provider.  Do not drink alcohol if: ? Your health care provider tells you not to drink. ? You are pregnant, may be pregnant, or are planning to become pregnant.  If you  drink alcohol: ? Limit how much you have to 0-1 drink a day. ? Be aware of how much alcohol is in your drink. In the [place] one drink equals one 12 oz bottle of beer (355 mL), one 5 oz glass of wine (148 mL), or one 1 oz glass of hard liquor (44 mL).   Lifestyle  Take daily care of your teeth and gums. Brush your teeth every morning and night with fluoride toothpaste. Floss one time each day.  Stay active. Exercise for at least 30 minutes 5 or more days each week.  Do not use any products that contain nicotine or tobacco, such as cigarettes, e-cigarettes, and chewing tobacco. If you need help quitting, ask your health care provider.  Do not use drugs.  If you are sexually active, practice safe sex. Use a condom or other form of protection to prevent STIs (sexually transmitted infections).  If you do not wish to become pregnant, use a form of birth control. If you plan to become pregnant, see your health care provider for a prepregnancy visit.  If told by your health care provider, take low-dose aspirin daily starting at age 67.  Find healthy ways to cope with stress, such as: ? Meditation, yoga, or listening to music. ? Journaling. ? Talking to a trusted person. ? Spending time with friends and family. Safety  Always wear your seat belt while driving or riding in a vehicle.  Do not drive: ? If you have been drinking alcohol. Do not ride with someone who has been drinking. ? When you are tired or distracted. ? While texting.  Wear a helmet and other protective equipment during sports activities.  If you have firearms in your house, make sure you follow all gun safety procedures. What's next?  Visit your health care provider once a year for an annual wellness visit.  Ask your health care provider how often you should have your eyes and teeth checked.  Stay up to date on all vaccines. This information is not intended to replace advice given to you by your health care provider.  Make sure you discuss any questions you have with your health care provider. Document Revised: 09/07/2020 Document Reviewed: 08/15/2018 Elsevier Patient Education  2021 [employer].

## 2021-04-28 NOTE — Assessment & Plan Note (Signed)
Improving with beet and carrot juice, working on regular activity and diet.   Continue to monitor.

## 2021-04-28 NOTE — Progress Notes (Signed)
Subjective:    Patient ID: Samantha Hall, female    DOB: [birth year], 60 y.o.   MRN: [id number]  HPI  Samantha Hall is a very pleasant 60 y.o. female who presents today for complete physical and follow up of chronic conditions.   Immunizations: -Tetanus:  2021 -Influenza: Did not complete last season  -Covid-19: Completed 3 vaccines -Shingles: Completed Shingrix   Diet: She endorses a fair diet. She is trying to limit fried/fatty food intake.  Exercise: No regular exercise, is active.   Eye exam: Completes annually  Dental exam: Completes annually   Pap Smear: Hysterectomy  Mammogram: Completed in 2022 Colonoscopy: 2021, due in 2031  BP Readings from Last 3 Encounters:  04/28/21 118/70  06/18/20 124/78  05/19/20 130/60   Wt Readings from Last 3 Encounters:  04/28/21 172 lb (78 kg)  06/18/20 174 lb (78.9 kg)  05/19/20 171 lb 8 oz (77.8 kg)      Review of Systems  Constitutional: Negative for unexpected weight change.  HENT: Negative for rhinorrhea.   Eyes: Negative for visual disturbance.  Respiratory: Negative for shortness of breath.   Cardiovascular: Negative for chest pain.  Gastrointestinal: Negative for constipation and diarrhea.  Genitourinary: Negative for difficulty urinating.  Musculoskeletal: Positive for arthralgias. Negative for myalgias.  Skin: Negative for rash.  Allergic/Immunologic: Positive for environmental allergies.  Neurological: Negative for dizziness, numbness and headaches.  Psychiatric/Behavioral:       Recent personal stress, feeling overwhelmed.          Past Medical History:  Diagnosis Date  . Elevated antinuclear antibody (ANA) level    Negative workup for Lupus.  . Fibroids   . Fracture of left patella 04/30/2018  . Hyperlipidemia   . Hypertension    Controlled  . Osteoarthritis   . Sciatica   . Vitamin D deficiency     Social History   Socioeconomic History  . Marital status: Married    Spouse name: Not on  file  . Number of children: Not on file  . Years of education: Not on file  . Highest education level: Not on file  Occupational History  . Not on file  Tobacco Use  . Smoking status: Never Smoker  . Smokeless tobacco: Never Used  Vaping Use  . Vaping Use: Never used  Substance and Sexual Activity  . Alcohol use: Yes    Alcohol/week: 0.0 standard drinks    Comment: sociall  . Drug use: No  . Sexual activity: Yes    Birth control/protection: Surgical    Comment: Hysterectomy  Other Topics Concern  . Not on file  Social History Narrative   Married.   Highest level of education Bachelors.    Works as a Education officer, museum.   Social Determinants of Health   Financial Resource Strain: Not on file  Food Insecurity: Not on file  Transportation Needs: Not on file  Physical Activity: Not on file  Stress: Not on file  Social Connections: Not on file  Intimate Partner Violence: Not on file    Past Surgical History:  Procedure Laterality Date  . ABDOMINAL HYSTERECTOMY  2002  . CHOLECYSTECTOMY  2015  . TUBAL LIGATION  1984    Family History  Problem Relation Age of Onset  . Diabetes Mother   . Heart disease Mother   . Heart disease Father     Allergies  Allergen Reactions  . Cefazolin Swelling and Anaphylaxis    Difficulty breathing  . Lidocaine Swelling  . Other  Rash    Contact rash  . Sulfa Antibiotics Rash    Current Outpatient Medications on File Prior to Visit  Medication Sig Dispense Refill  . amLODipine (NORVASC) 5 MG tablet Take 1 tablet (5 mg total) by mouth daily. DUE FOR A FOLLOW UP APPOINTMENT 90 tablet 0  . aspirin 81 MG EC tablet Take by mouth.    Marland Kitchen b complex vitamins tablet Take 1 tablet by mouth daily.    . Cholecalciferol (VITAMIN D) 2000 units CAPS Take by mouth.    . estradiol (ESTRACE VAGINAL) 0.1 MG/GM vaginal cream Place 1 Applicatorful vaginally 3 (three) times a week. 42.5 g 3  . fexofenadine (ALLEGRA) 180 MG tablet Take 180 mg by mouth daily  as needed.    . hydrochlorothiazide (HYDRODIURIL) 12.5 MG tablet Take 1 tablet (12.5 mg total) by mouth daily. For blood pressure. 90 tablet 1  . hydrOXYzine (ATARAX/VISTARIL) 10 MG tablet TAKE 1 CAPSULE BY MOUTH UP TO TWICE DAILY AS NEEDED FOR ANXIETY. 30 tablet 0  . olopatadine (PATANOL) 0.1 % ophthalmic solution Place 1 drop into both eyes 2 (two) times daily as needed for allergies. 5 mL 0  . SUMAtriptan (IMITREX) 25 MG tablet PLEASE SEE ATTACHED FOR DETAILED DIRECTIONS    . triamcinolone cream (KENALOG) 0.5 % APPLY TO AFFECTED AREA TWICE A DAY 30 g 0  . TURMERIC PO Take 4 capsules by mouth daily.    . cyclobenzaprine (FLEXERIL) 10 MG tablet Take 1 tablet (10 mg total) by mouth 2 (two) times daily as needed for muscle spasms. (Patient not taking: Reported on 04/28/2021) 30 tablet 0   No current facility-administered medications on file prior to visit.    BP 118/70   Pulse 89   Temp (!) 96.7 F (35.9 C) (Temporal)   Ht 5' 4.5" (1.638 m)   Wt 172 lb (78 kg)   SpO2 98%   BMI 29.07 kg/m  Objective:   Physical Exam HENT:     Right Ear: Tympanic membrane and ear canal normal.     Left Ear: Tympanic membrane and ear canal normal.     Nose: Nose normal.  Eyes:     Conjunctiva/sclera: Conjunctivae normal.     Pupils: Pupils are equal, round, and reactive to light.  Neck:     Thyroid: No thyromegaly.  Cardiovascular:     Rate and Rhythm: Normal rate and regular rhythm.     Heart sounds: No murmur heard.   Pulmonary:     Effort: Pulmonary effort is normal.     Breath sounds: Normal breath sounds. No rales.  Abdominal:     General: Bowel sounds are normal.     Palpations: Abdomen is soft.     Tenderness: There is no abdominal tenderness.  Musculoskeletal:        General: Normal range of motion.     Cervical back: Neck supple.  Lymphadenopathy:     Cervical: No cervical adenopathy.  Skin:    General: Skin is warm and dry.     Findings: No rash.  Neurological:     Mental  Status: She is alert and oriented to person, place, and time.     Cranial Nerves: No cranial nerve deficit.     Deep Tendon Reflexes: Reflexes are normal and symmetric.  Psychiatric:        Mood and Affect: Mood normal.           Assessment & Plan:      This visit occurred during  the SARS-CoV-2 public health emergency.  Safety protocols were in place, including screening questions prior to the visit, additional usage of staff PPE, and extensive cleaning of exam room while observing appropriate contact time as indicated for disinfecting solutions.

## 2021-04-28 NOTE — Assessment & Plan Note (Signed)
Unchanged from November 2021, repeat ultrasound due in November 2022.

## 2021-04-29 LAB — HEPATITIS B SURFACE ANTIBODY, QUANTITATIVE: Hep B S AB Quant (Post): 49 m[IU]/mL (ref >=10)

## 2021-04-29 LAB — HEPATITIS B SURFACE ANTIGEN: Hepatitis B Surface Ag: NONREACTIVE

## 2021-05-02 NOTE — Telephone Encounter (Signed)
Will see then. 

## 2021-05-02 NOTE — Telephone Encounter (Signed)
Spoke to patient by telephone and was advised that she has had in similar symptoms in the past. Patient stated that she saw Allie Bossier NP last week and she had her stop her Amlodipine because of swelling in her ankles. Patient stated that she was advised to take HCTZ . Patient stated that the tingling in her face has been off and on since Friday, but has not gotten any worse. Patient stated that she has had a slight headache and head pressure. Patient stated that her blood pressure has been 145/85, 135/80 and 140/80. Patient denies chest pain, SOB, weakness on either side of her body. Patient stated that she was sending the message to Samantha Hall in case that she wants to add another blood pressure medication back to her regimen. After speaking to Samantha Hall patient was added to Dr. Bosie Hall schedule tomorrow 05/03/21 at 10:30 because Samantha Hall does not have anything available tomorrow. Patient was given ER precautions and she verbalized understanding.

## 2021-05-03 ENCOUNTER — Encounter: Payer: Self-pay | Admitting: Family Medicine

## 2021-05-03 ENCOUNTER — Other Ambulatory Visit: Payer: Self-pay

## 2021-05-03 ENCOUNTER — Ambulatory Visit: Payer: Managed Care, Other (non HMO) | Admitting: Family Medicine

## 2021-05-03 VITALS — BP 132/70 | HR 70 | Temp 97.9°F | Ht 64.5 in | Wt 170.5 lb

## 2021-05-03 DIAGNOSIS — E785 Hyperlipidemia, unspecified: Secondary | ICD-10-CM | POA: Diagnosis not present

## 2021-05-03 DIAGNOSIS — R202 Paresthesia of skin: Secondary | ICD-10-CM | POA: Diagnosis not present

## 2021-05-03 DIAGNOSIS — I10 Essential (primary) hypertension: Secondary | ICD-10-CM

## 2021-05-03 LAB — BASIC METABOLIC PANEL
BUN: 17 mg/dL (ref 6–23)
CO2: 32 mEq/L (ref 19–32)
Calcium: 10.1 mg/dL (ref 8.4–10.5)
Chloride: 98 mEq/L (ref 96–112)
Creatinine, Ser: 0.99 mg/dL (ref 0.40–1.20)
GFR: 61.8 mL/min (ref >60.00)
Glucose, Bld: 94 mg/dL (ref 70–99)
Potassium: 4.4 mEq/L (ref 3.5–5.1)
Sodium: 138 mEq/L (ref 135–145)

## 2021-05-03 LAB — MAGNESIUM: Magnesium: 2.3 mg/dL (ref 1.5–2.5)

## 2021-05-03 LAB — FOLATE: Folate: >24.4 ng/mL (ref >5.9)

## 2021-05-03 LAB — VITAMIN B12: Vitamin B-12: 719 pg/mL (ref 211–911)

## 2021-05-03 NOTE — Patient Instructions (Signed)
Labs today - check vitamin levels as well as electrolytes after starting hydrochlorothiazide diuretic.  I'm glad you're feeling better.  Let us know if symptoms are returning or worsening.

## 2021-05-03 NOTE — Assessment & Plan Note (Addendum)
Reviewed latest readings as well as ASCVD risk - no additional medication at this time. The 10-year ASCVD risk score Samantha Hall Samantha Brooke Bonito., Samantha al., Samantha Hall) is: 6.4%   Values used to calculate the score:     Age: 60 years     Sex: Female     Is Non-Hispanic African American: Yes     Diabetic: No     Tobacco smoker: No     Systolic Blood Pressure: 741 mmHg     Is BP treated: Yes     HDL Cholesterol: 83.8 mg/dL     Total Cholesterol: 220 mg/dL

## 2021-05-03 NOTE — Assessment & Plan Note (Signed)
Recent elevations however now blood pressures seem to be stabilized - will continue hctz 12.5mg  daily, stay off amlodipine.

## 2021-05-03 NOTE — Progress Notes (Signed)
Patient ID: Samantha Hall, female    DOB: 12/04/1961, 60 y.o.   MRN: [id number]  This visit was conducted in person.  BP 132/70   Pulse 70   Temp 97.9 F (36.6 C) (Temporal)   Ht 5' 4.5" (1.638 m)   Wt 170 lb 8 oz (77.3 kg)   SpO2 96%   BMI 28.81 kg/m   BP Readings from Last 3 Encounters:  05/03/21 132/70  04/28/21 118/70  06/18/20 124/78    CC: hypertension and facial tingling  Subjective:   HPI: Samantha Hall is a 60 y.o. female presenting on 05/03/2021 for Hypertension (C/o higher than normal BP readings.  Also, c/o face numbness/tingling.  Says BP was better this morning and pt feels better.  )   Patient of Allie Bossier presents today with concerns over recently elevated blood pressure readings to 140s/80s, in setting of recently stopping amlodipine 5mg  and starting hctz 12.5mg  daily (she was taking PRN) when she saw PCP earlier this month. She also has noted slight headache, bitemporal pressure, and facial tingling and numbness to bilateral cheeks L>R as well as to tip of nose and perioral region. She did notice cramping to legs as well - so took some magnesium with benefit. Notes BP elevation during stressful periods. She did have stressful day yesterday. Ankle swelling is better off amlodipine.   No fevers/chills, congestion, ear or tooth pain, nausea, abd pain, vision changes.  She did start taking 81mg  aspirin daily yesterday.    fmhx stroke - brother age 30yo.  Father with h/o amyloid heart disease age 29yo fmhx diabetes.      Relevant past medical, surgical, family and social history reviewed and updated as indicated. Interim medical history since our last visit reviewed. Allergies and medications reviewed and updated. Outpatient Medications Prior to Visit  Medication Sig Dispense Refill  . aspirin 81 MG EC tablet Take by mouth.    Marland Kitchen b complex vitamins tablet Take 1 tablet by mouth daily.    . Cholecalciferol (VITAMIN D) 2000 units CAPS Take by mouth.    .  estradiol (ESTRACE VAGINAL) 0.1 MG/GM vaginal cream Place 1 Applicatorful vaginally 3 (three) times a week. 42.5 g 3  . fexofenadine (ALLEGRA) 180 MG tablet Take 180 mg by mouth daily as needed.    . hydrochlorothiazide (HYDRODIURIL) 12.5 MG tablet Take 1 tablet (12.5 mg total) by mouth daily. For blood pressure. 90 tablet 1  . hydrOXYzine (ATARAX/VISTARIL) 10 MG tablet TAKE 1 CAPSULE BY MOUTH UP TO TWICE DAILY AS NEEDED FOR ANXIETY. 30 tablet 0  . olopatadine (PATANOL) 0.1 % ophthalmic solution Place 1 drop into both eyes 2 (two) times daily as needed for allergies. 5 mL 1  . SUMAtriptan (IMITREX) 25 MG tablet PLEASE SEE ATTACHED FOR DETAILED DIRECTIONS    . triamcinolone cream (KENALOG) 0.5 % APPLY TO AFFECTED AREA TWICE A DAY 30 g 0  . TURMERIC PO Take 4 capsules by mouth daily.    Marland Kitchen amLODipine (NORVASC) 5 MG tablet Take 1 tablet (5 mg total) by mouth daily. DUE FOR A FOLLOW UP APPOINTMENT 90 tablet 0   No facility-administered medications prior to visit.     Per HPI unless specifically indicated in ROS section below Review of Systems Objective:  BP 132/70   Pulse 70   Temp 97.9 F (36.6 C) (Temporal)   Ht 5' 4.5" (1.638 m)   Wt 170 lb 8 oz (77.3 kg)   SpO2 96%   BMI 28.81 kg/m  Wt Readings from Last 3 Encounters:  05/03/21 170 lb 8 oz (77.3 kg)  04/28/21 172 lb (78 kg)  06/18/20 174 lb (78.9 kg)      Physical Exam Vitals and nursing note reviewed.  Constitutional:      Appearance: Normal appearance. She is not ill-appearing.  HENT:     Mouth/Throat:     Mouth: Mucous membranes are moist.     Pharynx: Oropharynx is clear. No oropharyngeal exudate or posterior oropharyngeal erythema.  Eyes:     Extraocular Movements: Extraocular movements intact.     Pupils: Pupils are equal, round, and reactive to light.  Neck:     Vascular: No carotid bruit.  Cardiovascular:     Rate and Rhythm: Normal rate and regular rhythm.     Pulses: Normal pulses.     Heart sounds: Normal  heart sounds. No murmur heard.   Pulmonary:     Effort: Pulmonary effort is normal. No respiratory distress.     Breath sounds: Normal breath sounds. No wheezing, rhonchi or rales.  Musculoskeletal:     Right lower leg: No edema.     Left lower leg: No edema.     Comments: 2+ DP bilaterally  Skin:    General: Skin is warm and dry.     Findings: No rash.  Neurological:     Mental Status: She is alert.     Cranial Nerves: Cranial nerves are intact.     Sensory: Sensation is intact.     Motor: Motor function is intact.     Coordination: Coordination is intact. Romberg sign negative.     Comments:  CN 2-12 intact except for mild L sided facial numbness to V2 distribution FTN intact EOMI No pronator drift.  Psychiatric:        Mood and Affect: Mood normal.        Behavior: Behavior normal.       Results for orders placed or performed in visit on 75/64/33  Basic metabolic panel  Result Value Ref Range   Sodium 138 135 - 145 mEq/L   Potassium 4.4 3.5 - 5.1 mEq/L   Chloride 98 96 - 112 mEq/L   CO2 32 19 - 32 mEq/L   Glucose, Bld 94 70 - 99 mg/dL   BUN 17 6 - 23 mg/dL   Creatinine, Ser 0.99 0.40 - 1.20 mg/dL   GFR 61.80 >60.00 mL/min   Calcium 10.1 8.4 - 10.5 mg/dL  Vitamin B12  Result Value Ref Range   Vitamin B-12 719 211 - 911 pg/mL  Folate  Result Value Ref Range   Folate >24.4 >5.9 ng/mL  Magnesium  Result Value Ref Range   Magnesium 2.3 1.5 - 2.5 mg/dL   Assessment & Plan:  This visit occurred during the SARS-CoV-2 public health emergency.  Safety protocols were in place, including screening questions prior to the visit, additional usage of staff PPE, and extensive cleaning of exam room while observing appropriate contact time as indicated for disinfecting solutions.   Problem List Items Addressed This Visit    Essential hypertension - Primary    Recent elevations however now blood pressures seem to be stabilized - will continue hctz 12.5mg  daily, stay off  amlodipine.       Relevant Orders   Basic metabolic panel (Completed)   Magnesium (Completed)   Hyperlipidemia    Reviewed latest readings as well as ASCVD risk - no additional medication at this time. The 10-year ASCVD risk score Mikey Bussing DC Brooke Bonito.,  et al., 2013) is: 6.4%   Values used to calculate the score:     Age: 21 years     Sex: Female     Is Non-Hispanic African American: Yes     Diabetic: No     Tobacco smoker: No     Systolic Blood Pressure: 202 mmHg     Is BP treated: Yes     HDL Cholesterol: 83.8 mg/dL     Total Cholesterol: 220 mg/dL       Facial paresthesia    Several days of bilateral facial paresthesias and numbness in setting of recent increased stress and mild BP elevation. Mild L facial numbness persists, otherwise nonfocal neurological exam. Check labs including b12, folate, calcium levels, check further electrolytes in setting of recently starting diuretic. Symptoms have largely resolved. Advised let us know if ongoing facial paresthesias to consider head imaging. Pt agrees with plan.  She has also recently started aspirin 81mg  daily - will continue this.       Relevant Orders   Basic metabolic panel (Completed)   Vitamin B12 (Completed)   Folate (Completed)   Magnesium (Completed)       No orders of the defined types were placed in this encounter.  Orders Placed This Encounter  Procedures  . Basic metabolic panel  . Vitamin B12  . Folate  . Magnesium    Patient Instructions  Labs today - check vitamin levels as well as electrolytes after starting hydrochlorothiazide diuretic.  I'm glad you're feeling better.  Let us know if symptoms are returning or worsening.   Follow up plan: Return if symptoms worsen or fail to improve.  Ria Bush, MD

## 2021-05-03 NOTE — Assessment & Plan Note (Addendum)
Several days of bilateral facial paresthesias and numbness in setting of recent increased stress and mild BP elevation. Mild L facial numbness persists, otherwise nonfocal neurological exam. Check labs including b12, folate, calcium levels, check further electrolytes in setting of recently starting diuretic. Symptoms have largely resolved. Advised let us know if ongoing facial paresthesias to consider head imaging. Pt agrees with plan.  She has also recently started aspirin 81mg  daily - will continue this.

## 2021-06-07 ENCOUNTER — Other Ambulatory Visit: Payer: Self-pay | Admitting: Primary Care

## 2021-06-07 DIAGNOSIS — I10 Essential (primary) hypertension: Secondary | ICD-10-CM

## 2021-06-07 DIAGNOSIS — U071 COVID-19: Secondary | ICD-10-CM

## 2021-06-10 ENCOUNTER — Telehealth: Payer: Self-pay | Admitting: Primary Care

## 2021-06-10 MED ORDER — NIRMATRELVIR/RITONAVIR (PAXLOVID) TABLET (RENAL DOSING): 2.0000 | ORAL_TABLET | Freq: Two times a day (BID) | ORAL | 0 refills | Status: AC

## 2021-06-10 NOTE — Telephone Encounter (Signed)
[place] Night - Client TELEPHONE ADVICE RECORD [facility] Patient Name: Samantha Hall Gender: Female DOB: 1961/11/04 Age: 60 Y 4 D Return Phone Number: [phone] (Primary) Address: City/ State/ Zip: [place] [address] Client [place] Night - Client Client Site [place] Physician Alma Friendly - NP Contact Type Call Who Is Calling Patient / Member / Family / Caregiver Call Type Triage / Clinical Relationship To Patient Self Return Phone Number [phone] (Primary) Chief Complaint Headache Reason for Call Symptomatic / Request for [place] was told to notified her doctor immediately if she tested positive for covid. She has a cough, headache, tired, congestion, her chest feels congested, and she's been sleeping a lot. Translation No Disp. Time [name] Time) Disposition Final User 06/09/2021 11:50:43 PM Attempt made - message left Normajean Glasgow 06/10/2021 12:04:11 AM Attempt made - message left Percell Miller RN, Sherlie Ban 06/10/2021 12:14:55 AM FINAL ATTEMPT MADE - message left Yes Percell Miller, RN, Sherlie Ban

## 2021-06-10 NOTE — Telephone Encounter (Signed)
[place] Night - Client TELEPHONE ADVICE RECORD [facility] Patient Name: Samantha Hall Gender: Female DOB: 1961/10/24 Age: 60 Y 4 D Return Phone Number: [phone] (Primary) Address: City/ State/ Zip: [place] [address] Client [place] Night - Client Client Site [place] Physician Alma Friendly - NP Contact Type Call Who Is Calling Patient / Member / Family / Caregiver Call Type Triage / Clinical Relationship To Patient Self Return Phone Number [phone] (Primary) Chief Complaint Headache Reason for Call Symptomatic / Request for [place] was told to notified her doctor immediately if she tested positive for covid. She has a cough, headache, tired, congestion, her chest feels congested, and she's been sleeping a lot. Translation No Disp. Time [name] Time) Disposition Final User 06/09/2021 11:50:43 PM Attempt made - message left Normajean Glasgow 06/10/2021 12:04:11 AM Attempt made - message left Percell Miller RN, Sherlie Ban 06/10/2021 12:14:55 AM FINAL ATTEMPT MADE - message left Yes Percell Miller, RN, Sherlie Ban

## 2021-06-10 NOTE — Telephone Encounter (Signed)
Samantha Hall called in and stated that she was told to let [name] know if she tested positive and she tested and the results came back last night positive and its in [place]

## 2021-06-10 NOTE — Telephone Encounter (Signed)
Please advise 

## 2021-06-10 NOTE — Telephone Encounter (Addendum)
Please also see Samantha Hall message dated 06/07/21. I spoke with Samantha Hall; Samantha Hall went to sleep last night and did not hear phone from access nurse. Today Samantha Hall said she is not in any distress. Samantha Hall said dry cough started on 06/07/21 or 06/08/21. No CP or SOB. Samantha Hall said she was to let [name] know if tested + and results were positive for covid on 06/09/21.. Samantha Hall has chills but no fever; H/A on 06/09/21. [facility]. Samantha Hall request cb after Gentry Fitz NP reviews note. UC & ED precautions given and Samantha Hall voiced understanding. Also taking note to Gentry Fitz NP.

## 2021-06-10 NOTE — Telephone Encounter (Signed)
Noted, see MyChart message.

## 2021-06-13 NOTE — Telephone Encounter (Signed)
Contacted pt to see how she was doing. Symptoms are getting better but she is still not back to normal. She has continued fatigue and some SOB. Advised if any worsening of symptoms or new symptoms to contact office. Advised of ER precautions. Pt verbalized understanding.

## 2021-06-17 ENCOUNTER — Emergency Department
Admission: EM | Admit: 2021-06-17 | Discharge: 2021-06-18 | Disposition: A | Discharge status: Home / Self Care | Payer: Managed Care, Other (non HMO) | Attending: Emergency Medicine | Admitting: Emergency Medicine

## 2021-06-17 ENCOUNTER — Other Ambulatory Visit: Payer: Self-pay

## 2021-06-17 ENCOUNTER — Emergency Department: Payer: Managed Care, Other (non HMO)

## 2021-06-17 DIAGNOSIS — Z7982 Long term (current) use of aspirin: Secondary | ICD-10-CM | POA: Insufficient documentation

## 2021-06-17 DIAGNOSIS — U071 COVID-19: Secondary | ICD-10-CM | POA: Diagnosis not present

## 2021-06-17 DIAGNOSIS — Z79899 Other long term (current) drug therapy: Secondary | ICD-10-CM | POA: Insufficient documentation

## 2021-06-17 DIAGNOSIS — R0602 Shortness of breath: Secondary | ICD-10-CM

## 2021-06-17 DIAGNOSIS — I10 Essential (primary) hypertension: Secondary | ICD-10-CM | POA: Diagnosis not present

## 2021-06-17 LAB — COMPREHENSIVE METABOLIC PANEL
ALT: 40 U/L (ref 0–44)
AST: 39 U/L (ref 15–41)
Albumin: 4.3 g/dL (ref 3.5–5.0)
Alkaline Phosphatase: 85 U/L (ref 38–126)
Anion gap: 8 (ref 5–15)
BUN: 16 mg/dL (ref 8–23)
CO2: 26 mmol/L (ref 22–32)
Calcium: 9.2 mg/dL (ref 8.9–10.3)
Chloride: 102 mmol/L (ref 98–111)
Creatinine, Ser: 0.89 mg/dL (ref 0.44–1.00)
GFR, Estimated: >60 mL/min (ref >60)
Glucose, Bld: 142 mg/dL — ABNORMAL HIGH (ref 70–99)
Potassium: 3.4 mmol/L — ABNORMAL LOW (ref 3.5–5.1)
Sodium: 136 mmol/L (ref 135–145)
Total Bilirubin: 0.6 mg/dL (ref 0.3–1.2)
Total Protein: 7.9 g/dL (ref 6.5–8.1)

## 2021-06-17 LAB — CBC WITH DIFFERENTIAL/PLATELET
Abs Immature Granulocytes: 0.01 10*3/uL (ref 0.00–0.07)
Basophils Absolute: 0 10*3/uL (ref 0.0–0.1)
Basophils Relative: 0 %
Eosinophils Absolute: 0.1 10*3/uL (ref 0.0–0.5)
Eosinophils Relative: 1 %
HCT: 39.9 % (ref 36.0–46.0)
Hemoglobin: 13.8 g/dL (ref 12.0–15.0)
Immature Granulocytes: 0 %
Lymphocytes Relative: 57 %
Lymphs Abs: 2.7 10*3/uL (ref 0.7–4.0)
MCH: 30.9 pg (ref 26.0–34.0)
MCHC: 34.6 g/dL (ref 30.0–36.0)
MCV: 89.3 fL (ref 80.0–100.0)
Monocytes Absolute: 0.3 10*3/uL (ref 0.1–1.0)
Monocytes Relative: 7 %
Neutro Abs: 1.7 10*3/uL (ref 1.7–7.7)
Neutrophils Relative %: 35 %
Platelets: 292 10*3/uL (ref 150–400)
RBC: 4.47 MIL/uL (ref 3.87–5.11)
RDW: 12.4 % (ref 11.5–15.5)
WBC: 4.8 10*3/uL (ref 4.0–10.5)
nRBC: 0 % (ref 0.0–0.2)

## 2021-06-17 LAB — TROPONIN I (HIGH SENSITIVITY)
Troponin I (High Sensitivity): 3 ng/L (ref <18)
Troponin I (High Sensitivity): 4 ng/L (ref <18)

## 2021-06-17 MED ORDER — ALBUTEROL SULFATE HFA 108 (90 BASE) MCG/ACT IN AERS: 2.0000 | INHALATION_SPRAY | Freq: Four times a day (QID) | RESPIRATORY_TRACT | 0 refills | Status: DC | PRN

## 2021-06-17 NOTE — ED Triage Notes (Signed)
Pt to ER via POV with complaints of shortness of breath x3 days, cough, fatigue and back pain. Pt tested positive for covid 6/22.   Pt was prescribed albuterol inhaler with no relief in symptoms.

## 2021-06-17 NOTE — Telephone Encounter (Signed)
Pt said she had used the inhaler without any real improvement in her breathing after 20 - 30 mins. Pt walked to kitchen and was immediately SOB and had to sit down to rest. Pt said she does not have CP but does  have continuous chest heaviness for most of the week. Pt said she is just exhausted and very tired; feels wiped out. Pt is concerned about continuing to wait and get a blood clot possibility ruled out. Gentry Fitz NP was on call with another pt and I spoke with [facility] CMA and she and I both agree pt will not feel better until blood clot is ruled out. Pt is going to [facility] ED for eval and possible imaging as soon as her husband gets home; which pt said should be soon. ED parecautions given and pt voiced understanding. Sending note to Gentry Fitz NP.

## 2021-06-18 LAB — D-DIMER, QUANTITATIVE: D-Dimer, Quant: <0.27 ug/mL-FEU (ref 0.00–0.50)

## 2021-06-18 MED ORDER — HYDROCOD POLST-CPM POLST ER 10-8 MG/5ML PO SUER
5.0000 mL | Freq: Once | ORAL | Status: AC
  Administered 2021-06-18: 5 mL via ORAL
  Filled 2021-06-18: qty 5

## 2021-06-18 MED ORDER — PREDNISONE 50 MG PO TABS: ORAL_TABLET | ORAL | 0 refills | Status: DC

## 2021-06-18 MED ORDER — ALBUTEROL SULFATE (2.5 MG/3ML) 0.083% IN NEBU: 2.5000 mg | INHALATION_SOLUTION | RESPIRATORY_TRACT | 0 refills | Status: DC | PRN

## 2021-06-18 MED ORDER — IPRATROPIUM-ALBUTEROL 0.5-2.5 (3) MG/3ML IN SOLN
3.0000 mL | Freq: Once | RESPIRATORY_TRACT | Status: AC
Start: 2021-06-18 — End: 2021-06-18
  Administered 2021-06-18: 3 mL via RESPIRATORY_TRACT
  Filled 2021-06-18: qty 3

## 2021-06-18 MED ORDER — SODIUM CHLORIDE 0.9 % IV BOLUS
1000.0000 mL | Freq: Once | INTRAVENOUS | Status: AC
  Administered 2021-06-18: 1000 mL via INTRAVENOUS

## 2021-06-18 MED ORDER — COMPRESSOR/NEBULIZER MISC: 1.0000 [IU] | 0 refills | Status: AC | PRN

## 2021-06-18 MED ORDER — HYDROCOD POLST-CPM POLST ER 10-8 MG/5ML PO SUER: 5.0000 mL | Freq: Two times a day (BID) | ORAL | 0 refills | Status: DC

## 2021-06-18 MED ORDER — METHYLPREDNISOLONE SODIUM SUCC 125 MG IJ SOLR
125.0000 mg | Freq: Once | INTRAMUSCULAR | Status: AC
  Administered 2021-06-18: 125 mg via INTRAVENOUS
  Filled 2021-06-18: qty 2

## 2021-06-18 NOTE — Telephone Encounter (Signed)
Noted. ED notes reviewed. Negative D-dimer, chest xray, ECG.

## 2021-06-18 NOTE — Discharge Instructions (Addendum)
1.  Take Prednisone 50 mg daily x4 days. 2.  You may use Albuterol nebulizer every 4 hours as needed for cough/difficulty breathing. 3.  You may take Tussionex as needed for cough. 4.  Return to the ER for worsening symptoms, persistent vomiting, difficulty breathing or other concerns

## 2021-06-18 NOTE — ED Notes (Signed)
ED Provider at bedside. 

## 2021-06-18 NOTE — ED Provider Notes (Signed)
[facility] Emergency Department Provider Note   ____________________________________________   Event Date/Time   First MD Initiated Contact with Patient 06/18/21 0020     (approximate)  I have reviewed the triage vital signs and the nursing notes.   HISTORY  Chief Complaint Shortness of Breath    HPI Samantha Hall is a 60 y.o. female who presents to the ED from home with a chief complaint of fatigue, generalized weakness, cough, shortness of breath, back pain.  Patient tested positive for COVID-19 on 06/08/2021.  Finished Paxlovid and taking albuterol inhaler without relief of symptoms.  Denies fever, abdominal pain, nausea, vomiting or diarrhea.     Past Medical History:  Diagnosis Date   Elevated antinuclear antibody (ANA) level    Negative workup for Lupus.   Fibroids    Fracture of left patella 04/30/2018   Hyperlipidemia    Hypertension    Controlled   Osteoarthritis    Sciatica    Vitamin D deficiency     Patient Active Problem List   Diagnosis Date Noted   Vaginal atrophy 05/16/2020   Vitamin D deficiency 04/30/2020   Vaginal discharge 12/22/2019   Liver lesion, left lobe 11/17/2019   Kidney lesion, native, right 11/17/2019   Breast pain 11/17/2019   Constipation 11/17/2019   Rash and nonspecific skin eruption 03/31/2019   Facial paresthesia 03/11/2019   Pain of upper abdomen 12/27/2018   Bilateral hand pain 12/27/2018   Chronic shoulder pain 06/10/2018   Elevated LFTs 06/10/2018   Prediabetes 06/10/2018   Knee pain, bilateral 04/29/2018   Situational anxiety 12/05/2017   Chronic low back pain 10/31/2017   Osteoarthritis 02/02/2017   Preventative health care 02/02/2017   Hyperlipidemia 02/02/2017   Essential hypertension 11/22/2015    Past Surgical History:  Procedure Laterality Date   ABDOMINAL HYSTERECTOMY  2002   CHOLECYSTECTOMY  2015   TUBAL LIGATION  1984    Prior to Admission medications   Medication Sig  Start Date End Date Taking? Authorizing Provider  albuterol (PROVENTIL) (2.5 MG/3ML) 0.083% nebulizer solution Take 3 mLs (2.5 mg total) by nebulization every 4 (four) hours as needed for wheezing or shortness of breath. 06/18/21  Yes Paulette Blanch, MD  chlorpheniramine-HYDROcodone [facility] PENNKINETIC ER) 10-8 MG/5ML SUER Take 5 mLs by mouth 2 (two) times daily. 06/18/21  Yes Paulette Blanch, MD  Nebulizers (COMPRESSOR/NEBULIZER) MISC 1 Units by Does not apply route every 4 (four) hours as needed. 06/18/21  Yes Paulette Blanch, MD  predniSONE (DELTASONE) 50 MG tablet 1 tablet daily until finished 06/18/21  Yes Paulette Blanch, MD  albuterol (VENTOLIN HFA) 108 (90 Base) MCG/ACT inhaler Inhale 2 puffs into the lungs every 6 (six) hours as needed for shortness of breath. 06/17/21   Pleas Koch, NP  aspirin 81 MG EC tablet Take by mouth.    [provider]  b complex vitamins tablet Take 1 tablet by mouth daily.    [provider]  Cholecalciferol (VITAMIN D) 2000 units CAPS Take by mouth.    [provider]  estradiol (ESTRACE VAGINAL) 0.1 MG/GM vaginal cream Place 1 Applicatorful vaginally 3 (three) times a week. 05/14/20   Rod Can, CNM  fexofenadine (ALLEGRA) 180 MG tablet Take 180 mg by mouth daily as needed. 11/15/19   [provider]  hydrochlorothiazide (HYDRODIURIL) 12.5 MG tablet TAKE 1 TABLET BY MOUTH DAILY FOR BLOOD PRESSURE. 06/07/21   Pleas Koch, NP  hydrOXYzine (ATARAX/VISTARIL) 10 MG tablet TAKE  1 CAPSULE BY MOUTH UP TO TWICE DAILY AS NEEDED FOR ANXIETY. 12/04/18   Pleas Koch, NP  olopatadine (PATANOL) 0.1 % ophthalmic solution Place 1 drop into both eyes 2 (two) times daily as needed for allergies. 04/28/21   Pleas Koch, NP  SUMAtriptan (IMITREX) 25 MG tablet PLEASE SEE ATTACHED FOR DETAILED DIRECTIONS 12/18/19   [provider]  triamcinolone cream (KENALOG) 0.5 % APPLY TO AFFECTED AREA TWICE A DAY 08/06/19   Pleas Koch, NP  TURMERIC PO Take 4 capsules by mouth daily.    [provider]    Allergies Cefazolin, Lidocaine, Other, and Sulfa antibiotics  Family History  Problem Relation Age of Onset   Diabetes Mother    Heart disease Mother    Heart disease Father     Social History Social History   Tobacco Use   Smoking status: Never   Smokeless tobacco: Never  Vaping Use   Vaping Use: Never used  Substance Use Topics   Alcohol use: Yes    Alcohol/week: 0.0 standard drinks    Comment: sociall   Drug use: No    Review of Systems  Constitutional: Positive for generalized malaise, fatigue.  No fever/chills Eyes: No visual changes. ENT: No sore throat. Cardiovascular: Positive for chest pain. Respiratory: Positive for cough and shortness of breath. Gastrointestinal: No abdominal pain.  No nausea, no vomiting.  No diarrhea.  No constipation. Genitourinary: Negative for dysuria. Musculoskeletal: Negative for back pain. Skin: Negative for rash. Neurological: Negative for headaches, focal weakness or numbness.   ____________________________________________   PHYSICAL EXAM:  VITAL SIGNS: ED Triage Vitals [06/17/21 1733]  Enc Vitals Group     BP (!) 166/89     Pulse Rate 74     Resp 20     Temp (!) 97.5 F (36.4 C)     Temp Source Oral     SpO2 100 %     Weight      Height 5\' 5"  (1.651 m)     Head Circumference      Peak Flow      Pain Score 5     Pain Loc      Pain Edu?      Excl. in [place]?     Constitutional: Alert and oriented.  Tired appearing and in no acute distress. Eyes: Conjunctivae are normal. PERRL. EOMI. Head: Atraumatic. Nose: No congestion/rhinnorhea. Mouth/Throat: Mucous membranes are mildly dry. Neck: No stridor.  Supple neck without meningismus. Cardiovascular: Normal rate, regular rhythm. Grossly normal heart sounds.  Good peripheral circulation. Respiratory: Normal respiratory effort.  No retractions. Lungs CTAB. Gastrointestinal: Soft and  nontender to light or deep palpation. No distention. No abdominal bruits. No CVA tenderness. Musculoskeletal: No lower extremity tenderness nor edema.  No joint effusions. Neurologic:  Normal speech and language. No gross focal neurologic deficits are appreciated. No gait instability. Skin:  Skin is warm, dry and intact. No rash noted.  No petechiae. Psychiatric: Mood and affect are normal. Speech and behavior are normal.  ____________________________________________   LABS (all labs ordered are listed, but only abnormal results are displayed)  Labs Reviewed  COMPREHENSIVE METABOLIC PANEL - Abnormal; Notable for the following components:      Result Value   Potassium 3.4 (*)    Glucose, Bld 142 (*)    All other components within normal limits  CBC WITH DIFFERENTIAL/PLATELET  D-DIMER, QUANTITATIVE  TROPONIN I (HIGH SENSITIVITY)  TROPONIN I (HIGH SENSITIVITY)   ____________________________________________  EKG  ED ECG REPORT I, Michale Emmerich J, the attending physician, personally viewed and interpreted this ECG.   Date: 06/18/2021  EKG Time: 1739  Rate: 68  Rhythm: normal EKG, normal sinus rhythm  Axis: Normal  Intervals:none  ST&T Change: Nonspecific  ____________________________________________  RADIOLOGY I, Colman Birdwell J, personally viewed and evaluated these images (plain radiographs) as part of my medical decision making, as well as reviewing the written report by the radiologist.  ED MD interpretation: No acute cardiopulmonary process  Official radiology report(s): DG Chest 2 View  Result Date: 06/17/2021 CLINICAL DATA:  Shortness of breath.  Cough and fatigue.  Back pain. EXAM: CHEST - 2 VIEW COMPARISON:  03/29/2020 FINDINGS: The cardiomediastinal contours are normal. Subsegmental left lung base atelectasis or scar, similar to prior pulmonary vasculature is normal. No consolidation, pleural effusion, or pneumothorax. No acute osseous abnormalities are seen. IMPRESSION: No  acute chest finding. Electronically Signed   By: Keith Rake M.D.   On: 06/17/2021 18:43    ____________________________________________   PROCEDURES  Procedure(s) performed (including Critical Care):  .1-3 Lead EKG Interpretation  Date/Time: 06/18/2021 1:02 AM Performed by: Paulette Blanch, MD Authorized by: Paulette Blanch, MD     Interpretation: normal     ECG rate:  65   ECG rate assessment: normal     Rhythm: sinus rhythm     Ectopy: none     Conduction: normal   Comments:     Patient placed on cardiac monitor to evaluate for arrhythmias   ____________________________________________   INITIAL IMPRESSION / ASSESSMENT AND PLAN / ED COURSE  As part of my medical decision making, I reviewed the following data within the [facility] notes reviewed and incorporated, Labs reviewed, EKG interpreted, Old chart reviewed, Radiograph reviewed, and Notes from prior ED visits     60 year old female presenting with fatigue, cough, chest pain, shortness of breath in the setting of COVID-19. Differential includes, but is not limited to, viral syndrome, bronchitis including COPD exacerbation, pneumonia, reactive airway disease including asthma, CHF including exacerbation with or without pulmonary/interstitial edema, pneumothorax, ACS, thoracic trauma, and pulmonary embolism.   Laboratory results unremarkable; 2 sets of troponins unremarkable.  Chest x-ray clear.  Will check D-dimer, administer IV fluids, Solu-Medrol, Tussionex, DuoNeb for symptomatic relief.  Will reassess.  Clinical Course as of 06/18/21 9833  Sat Jun 18, 2021  0317 Updated patient on negative D-dimer.  Patient feels better after nebulizer treatment.  Will discharge home on prednisone burst, nebulizer, Tussionex and patient will follow up with her PCP.  Strict return precautions given.  Patient verbalizes understanding agrees with plan of care. [JS]    Clinical Course User Index [JS] Paulette Blanch,  MD     ____________________________________________   FINAL CLINICAL IMPRESSION(S) / ED DIAGNOSES  Final diagnoses:  Shortness of breath  COVID-19     ED Discharge Orders          Ordered    albuterol (PROVENTIL) (2.5 MG/3ML) 0.083% nebulizer solution  Every 4 hours PRN        06/18/21 0319    Nebulizers (COMPRESSOR/NEBULIZER) MISC  Every 4 hours PRN        06/18/21 0319    predniSONE (DELTASONE) 50 MG tablet        06/18/21 0319    chlorpheniramine-HYDROcodone (TUSSIONEX PENNKINETIC ER) 10-8 MG/5ML SUER  2 times daily        06/18/21 0319  Note:  This document was prepared using Dragon voice recognition software and may include unintentional dictation errors.    Paulette Blanch, MD 06/18/21 [phone]

## 2021-07-14 ENCOUNTER — Other Ambulatory Visit: Payer: Self-pay | Admitting: Primary Care

## 2021-07-14 DIAGNOSIS — U071 COVID-19: Secondary | ICD-10-CM

## 2021-07-26 ENCOUNTER — Other Ambulatory Visit: Payer: Self-pay | Admitting: Primary Care

## 2021-07-26 DIAGNOSIS — U071 COVID-19: Secondary | ICD-10-CM

## 2021-07-29 ENCOUNTER — Encounter: Payer: Self-pay | Admitting: Family Medicine

## 2021-07-29 ENCOUNTER — Ambulatory Visit: Payer: Managed Care, Other (non HMO) | Admitting: Family Medicine

## 2021-07-29 ENCOUNTER — Other Ambulatory Visit: Payer: Self-pay

## 2021-07-29 ENCOUNTER — Telehealth: Payer: Self-pay

## 2021-07-29 DIAGNOSIS — U099 Post covid-19 condition, unspecified: Secondary | ICD-10-CM | POA: Diagnosis not present

## 2021-07-29 MED ORDER — ALBUTEROL SULFATE (2.5 MG/3ML) 0.083% IN NEBU: 2.5000 mg | INHALATION_SOLUTION | RESPIRATORY_TRACT | 1 refills | Status: AC | PRN

## 2021-07-29 MED ORDER — ALBUTEROL SULFATE HFA 108 (90 BASE) MCG/ACT IN AERS: 2.0000 | INHALATION_SPRAY | Freq: Four times a day (QID) | RESPIRATORY_TRACT | 1 refills | Status: DC | PRN

## 2021-07-29 MED ORDER — BUDESONIDE 0.5 MG/2ML IN SUSP: 0.5000 mg | Freq: Two times a day (BID) | RESPIRATORY_TRACT | 0 refills | Status: DC

## 2021-07-29 MED ORDER — PULMICORT FLEXHALER 180 MCG/ACT IN AEPB: 1.0000 | INHALATION_SPRAY | Freq: Two times a day (BID) | RESPIRATORY_TRACT | 0 refills | Status: DC

## 2021-07-29 MED ORDER — SPACER/AERO-HOLDING CHAMBERS DEVI: 0 refills | Status: AC

## 2021-07-29 NOTE — Telephone Encounter (Signed)
Pulmicort in [place] is over $300. Insurance will cover the nebulizer solution. She has a nebulizer. Please send to [facility]. Call pt if there are any issues.

## 2021-07-29 NOTE — Telephone Encounter (Signed)
Pulmicort neb sent in.

## 2021-07-29 NOTE — Patient Instructions (Signed)
I do think cough is from residual COVID inflammation Albuterol refilled with spacer and inhaler and neb Start pulmicort inhaler 1 puff twice daily for a month. Let us know if any trouble filling.

## 2021-07-29 NOTE — Progress Notes (Signed)
Patient ID: Samantha Hall, female    DOB: 1961/04/22, 60 y.o.   MRN: [id number]  This visit was conducted in person.  BP 122/80   Pulse 67   Temp (!) 97.2 F (36.2 C) (Oral)   Ht '5\' 5"'$  (1.651 m)   Wt 169 lb 3.2 oz (76.7 kg)   SpO2 99%   BMI 28.16 kg/m    CC: discuss breathing Subjective:   HPI: Samantha Hall is a 60 y.o. female presenting on 07/29/2021 for Acute Visit (Cough/ medication refill)   Seen at ER 7/1 for weakness, fatigue, cough, dyspnea after COVID illness 06/08/2021. Records reviewed. EKG and CXR WNL, cardiac enzymes x2 normal, D dimer normal. Treated with IVF, solumedrol, tussionex and duoneb with benefit. Discharged with albuterol neb - she feels this worse better then albuterol inhaler.   Since COVID she notes ongoing need for albuterol nebulizer/inhaler due to persistent chest heaviness, cough, dyspnea. No wheeze. Finds triggers are heat. No fevers/chills, cough is occasionally productive of clear phlegm.   She misplaced albuterol inhaler - requests refill. She uses this with spacer - needs this as well. She would also like albuterol nebulizer solution refilled.   Upcoming pulm appt in 2 wks Raul Del) No h/o asthma or smoking.      Relevant past medical, surgical, family and social history reviewed and updated as indicated. Interim medical history since our last visit reviewed. Allergies and medications reviewed and updated. Outpatient Medications Prior to Visit  Medication Sig Dispense Refill   aspirin 81 MG EC tablet Take by mouth.     b complex vitamins tablet Take 1 tablet by mouth daily.     chlorpheniramine-HYDROcodone (TUSSIONEX PENNKINETIC ER) 10-8 MG/5ML SUER Take 5 mLs by mouth 2 (two) times daily. 70 mL 0   Cholecalciferol (VITAMIN D) 2000 units CAPS Take by mouth.     estradiol (ESTRACE VAGINAL) 0.1 MG/GM vaginal cream Place 1 Applicatorful vaginally 3 (three) times a week. 42.5 g 3   fexofenadine (ALLEGRA) 180 MG tablet Take 180 mg by mouth  daily as needed.     hydrochlorothiazide (HYDRODIURIL) 12.5 MG tablet TAKE 1 TABLET BY MOUTH DAILY FOR BLOOD PRESSURE. 90 tablet 3   hydrOXYzine (ATARAX/VISTARIL) 10 MG tablet TAKE 1 CAPSULE BY MOUTH UP TO TWICE DAILY AS NEEDED FOR ANXIETY. 30 tablet 0   Nebulizers (COMPRESSOR/NEBULIZER) MISC 1 Units by Does not apply route every 4 (four) hours as needed. 1 each 0   olopatadine (PATANOL) 0.1 % ophthalmic solution Place 1 drop into both eyes 2 (two) times daily as needed for allergies. 5 mL 1   SUMAtriptan (IMITREX) 25 MG tablet PLEASE SEE ATTACHED FOR DETAILED DIRECTIONS     triamcinolone cream (KENALOG) 0.5 % APPLY TO AFFECTED AREA TWICE A DAY 30 g 0   TURMERIC PO Take 4 capsules by mouth daily.     albuterol (PROVENTIL) (2.5 MG/3ML) 0.083% nebulizer solution Take 3 mLs (2.5 mg total) by nebulization every 4 (four) hours as needed for wheezing or shortness of breath. 75 mL 0   albuterol (VENTOLIN HFA) 108 (90 Base) MCG/ACT inhaler Inhale 2 puffs into the lungs every 6 (six) hours as needed for shortness of breath. 1 each 0   predniSONE (DELTASONE) 50 MG tablet 1 tablet daily until finished 4 tablet 0   No facility-administered medications prior to visit.     Per HPI unless specifically indicated in ROS section below Review of Systems  Objective:  BP 122/80   Pulse 67  Temp (!) 97.2 F (36.2 C) (Oral)   Ht '5\' 5"'$  (1.651 m)   Wt 169 lb 3.2 oz (76.7 kg)   SpO2 99%   BMI 28.16 kg/m   Wt Readings from Last 3 Encounters:  07/29/21 169 lb 3.2 oz (76.7 kg)  05/03/21 170 lb 8 oz (77.3 kg)  04/28/21 172 lb (78 kg)      Physical Exam Vitals and nursing note reviewed.  Constitutional:      Appearance: Normal appearance. She is not ill-appearing.  Cardiovascular:     Rate and Rhythm: Normal rate and regular rhythm.     Pulses: Normal pulses.     Heart sounds: Normal heart sounds. No murmur heard. Pulmonary:     Effort: Pulmonary effort is normal. No respiratory distress.     Breath  sounds: Normal breath sounds. No wheezing, rhonchi or rales.  Neurological:     Mental Status: She is alert.  Psychiatric:        Mood and Affect: Mood normal.        Behavior: Behavior normal.      Results for orders placed or performed during the hospital encounter of 06/17/21  CBC with Differential  Result Value Ref Range   WBC 4.8 4.0 - 10.5 K/uL   RBC 4.47 3.87 - 5.11 MIL/uL   Hemoglobin 13.8 12.0 - 15.0 g/dL   HCT 39.9 36.0 - 46.0 %   MCV 89.3 80.0 - 100.0 fL   MCH 30.9 26.0 - 34.0 pg   MCHC 34.6 30.0 - 36.0 g/dL   RDW 12.4 11.5 - 15.5 %   Platelets 292 150 - 400 K/uL   nRBC 0.0 0.0 - 0.2 %   Neutrophils Relative % 35 %   Neutro Abs 1.7 1.7 - 7.7 K/uL   Lymphocytes Relative 57 %   Lymphs Abs 2.7 0.7 - 4.0 K/uL   Monocytes Relative 7 %   Monocytes Absolute 0.3 0.1 - 1.0 K/uL   Eosinophils Relative 1 %   Eosinophils Absolute 0.1 0.0 - 0.5 K/uL   Basophils Relative 0 %   Basophils Absolute 0.0 0.0 - 0.1 K/uL   Immature Granulocytes 0 %   Abs Immature Granulocytes 0.01 0.00 - 0.07 K/uL  Comprehensive metabolic panel  Result Value Ref Range   Sodium 136 135 - 145 mmol/L   Potassium 3.4 (L) 3.5 - 5.1 mmol/L   Chloride 102 98 - 111 mmol/L   CO2 26 22 - 32 mmol/L   Glucose, Bld 142 (H) 70 - 99 mg/dL   BUN 16 8 - 23 mg/dL   Creatinine, Ser 0.89 0.44 - 1.00 mg/dL   Calcium 9.2 8.9 - 10.3 mg/dL   Total Protein 7.9 6.5 - 8.1 g/dL   Albumin 4.3 3.5 - 5.0 g/dL   AST 39 15 - 41 U/L   ALT 40 0 - 44 U/L   Alkaline Phosphatase 85 38 - 126 U/L   Total Bilirubin 0.6 0.3 - 1.2 mg/dL   GFR, Estimated >60 >60 mL/min   Anion gap 8 5 - 15  D-dimer, quantitative  Result Value Ref Range   D-Dimer, Quant <0.27 0.00 - 0.50 ug/mL-FEU  Troponin I (High Sensitivity)  Result Value Ref Range   Troponin I (High Sensitivity) 3 <18 ng/L  Troponin I (High Sensitivity)  Result Value Ref Range   Troponin I (High Sensitivity) 4 <18 ng/L    Assessment & Plan:  This visit occurred during  the SARS-CoV-2 public health emergency.  Safety protocols  were in place, including screening questions prior to the visit, additional usage of staff PPE, and extensive cleaning of exam room while observing appropriate contact time as indicated for disinfecting solutions.   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 Recent ER eval reassuring. No signs of bacterial superinfection.  Will refill albuterol inh/spacer and alb neb. Will start pulmicort 180 1 puff BID x1 month. Update with effect. Pt agrees with plan.       Relevant Medications   albuterol (VENTOLIN HFA) 108 (90 Base) MCG/ACT inhaler     Meds ordered this encounter  Medications   albuterol (PROVENTIL) (2.5 MG/3ML) 0.083% nebulizer solution    Sig: Take 3 mLs (2.5 mg total) by nebulization every 4 (four) hours as needed for wheezing or shortness of breath.    Dispense:  75 mL    Refill:  1   albuterol (VENTOLIN HFA) 108 (90 Base) MCG/ACT inhaler    Sig: Inhale 2 puffs into the lungs every 6 (six) hours as needed for shortness of breath.    Dispense:  1 each    Refill:  1   Spacer/Aero-Holding Chambers DEVI    Sig: Use as directed with albuterol inhaler    Dispense:  1 Units    Refill:  0   budesonide (PULMICORT FLEXHALER) 180 MCG/ACT inhaler    Sig: Inhale 1 puff into the lungs in the morning and at bedtime.    Dispense:  1 each    Refill:  0    No orders of the defined types were placed in this encounter.    Patient Instructions  I do think cough is from residual COVID inflammation Albuterol refilled with spacer and inhaler and neb Start pulmicort inhaler 1 puff twice daily for a month. Let us know if any trouble filling.   Follow up plan: Return if symptoms worsen or fail to improve.  Ria Bush, MD

## 2021-07-29 NOTE — Assessment & Plan Note (Signed)
Anticipate residual symptoms of cough, dyspnea, chest tightness are post COVID sequelae as no h/o asthma or smoking history. Recent ER eval reassuring. No signs of bacterial superinfection.  Will refill albuterol inh/spacer and alb neb. Will start pulmicort 180 1 puff BID x1 month. Update with effect. Pt agrees with plan.

## 2021-08-05 ENCOUNTER — Other Ambulatory Visit: Payer: Self-pay | Admitting: Family Medicine

## 2021-09-21 ENCOUNTER — Other Ambulatory Visit: Payer: Self-pay | Admitting: Family Medicine

## 2021-09-21 DIAGNOSIS — U099 Post covid-19 condition, unspecified: Secondary | ICD-10-CM

## 2021-09-24 ENCOUNTER — Other Ambulatory Visit: Payer: Self-pay | Admitting: Family Medicine

## 2021-09-27 NOTE — Telephone Encounter (Signed)
Spoke to patient by telephone and was advised that she did not request this refill. Patient stated that she is no longer using this. Patient stated that she was given a different treatment.

## 2021-10-13 DIAGNOSIS — R7303 Prediabetes: Secondary | ICD-10-CM

## 2021-10-13 DIAGNOSIS — R7989 Other specified abnormal findings of blood chemistry: Secondary | ICD-10-CM

## 2021-12-06 ENCOUNTER — Other Ambulatory Visit: Payer: Self-pay | Admitting: Neurology

## 2021-12-06 DIAGNOSIS — R413 Other amnesia: Secondary | ICD-10-CM

## 2021-12-26 ENCOUNTER — Other Ambulatory Visit: Payer: Managed Care, Other (non HMO)

## 2021-12-27 ENCOUNTER — Ambulatory Visit
Admission: RE | Admit: 2021-12-27 | Discharge: 2021-12-27 | Disposition: A | Discharge status: Home / Self Care | Payer: Managed Care, Other (non HMO) | Source: Ambulatory Visit | Attending: Neurology | Admitting: Neurology

## 2021-12-27 DIAGNOSIS — R413 Other amnesia: Secondary | ICD-10-CM

## 2021-12-27 MED ORDER — GADOBENATE DIMEGLUMINE 529 MG/ML IV SOLN
15.0000 mL | Freq: Once | INTRAVENOUS | Status: AC | PRN
  Administered 2021-12-27: 15 mL via INTRAVENOUS

## 2021-12-30 ENCOUNTER — Other Ambulatory Visit: Payer: Self-pay | Admitting: Family Medicine

## 2021-12-30 DIAGNOSIS — U099 Post covid-19 condition, unspecified: Secondary | ICD-10-CM

## 2022-01-01 NOTE — Telephone Encounter (Signed)
My chart sent to patient to see if she needs refill.

## 2022-01-20 LAB — HM MAMMOGRAPHY

## 2022-01-30 DIAGNOSIS — N289 Disorder of kidney and ureter, unspecified: Secondary | ICD-10-CM

## 2022-01-30 DIAGNOSIS — K769 Liver disease, unspecified: Secondary | ICD-10-CM

## 2022-01-31 ENCOUNTER — Other Ambulatory Visit: Payer: Self-pay

## 2022-01-31 ENCOUNTER — Other Ambulatory Visit (INDEPENDENT_AMBULATORY_CARE_PROVIDER_SITE_OTHER): Payer: Managed Care, Other (non HMO)

## 2022-01-31 DIAGNOSIS — R7303 Prediabetes: Secondary | ICD-10-CM | POA: Diagnosis not present

## 2022-01-31 DIAGNOSIS — R7989 Other specified abnormal findings of blood chemistry: Secondary | ICD-10-CM

## 2022-01-31 LAB — HEMOGLOBIN A1C: Hgb A1c MFr Bld: 5.9 % (ref 4.6–6.5)

## 2022-02-01 LAB — COMPREHENSIVE METABOLIC PANEL
ALT: 29 U/L (ref 0–35)
AST: 20 U/L (ref 0–37)
Albumin: 4.3 g/dL (ref 3.5–5.2)
Alkaline Phosphatase: 74 U/L (ref 39–117)
BUN: 16 mg/dL (ref 6–23)
CO2: 28 mEq/L (ref 19–32)
Calcium: 9.1 mg/dL (ref 8.4–10.5)
Chloride: 105 mEq/L (ref 96–112)
Creatinine, Ser: 0.95 mg/dL (ref 0.40–1.20)
GFR: 64.6 mL/min (ref >60.00)
Glucose, Bld: 82 mg/dL (ref 70–99)
Potassium: 3.7 mEq/L (ref 3.5–5.1)
Sodium: 140 mEq/L (ref 135–145)
Total Bilirubin: 0.7 mg/dL (ref 0.2–1.2)
Total Protein: 7.3 g/dL (ref 6.0–8.3)

## 2022-02-02 ENCOUNTER — Encounter: Payer: Self-pay | Admitting: Primary Care

## 2022-02-06 ENCOUNTER — Ambulatory Visit: Payer: Managed Care, Other (non HMO) | Admitting: Primary Care

## 2022-02-06 ENCOUNTER — Encounter: Payer: Self-pay | Admitting: Primary Care

## 2022-02-06 ENCOUNTER — Other Ambulatory Visit: Payer: Self-pay

## 2022-02-06 DIAGNOSIS — R7303 Prediabetes: Secondary | ICD-10-CM

## 2022-02-06 DIAGNOSIS — F418 Other specified anxiety disorders: Secondary | ICD-10-CM

## 2022-02-06 DIAGNOSIS — K769 Liver disease, unspecified: Secondary | ICD-10-CM

## 2022-02-06 DIAGNOSIS — R7989 Other specified abnormal findings of blood chemistry: Secondary | ICD-10-CM

## 2022-02-06 DIAGNOSIS — J45909 Unspecified asthma, uncomplicated: Secondary | ICD-10-CM | POA: Insufficient documentation

## 2022-02-06 DIAGNOSIS — I10 Essential (primary) hypertension: Secondary | ICD-10-CM | POA: Diagnosis not present

## 2022-02-06 DIAGNOSIS — N289 Disorder of kidney and ureter, unspecified: Secondary | ICD-10-CM

## 2022-02-06 DIAGNOSIS — G4733 Obstructive sleep apnea (adult) (pediatric): Secondary | ICD-10-CM

## 2022-02-06 DIAGNOSIS — J4530 Mild persistent asthma, uncomplicated: Secondary | ICD-10-CM

## 2022-02-06 DIAGNOSIS — R519 Headache, unspecified: Secondary | ICD-10-CM

## 2022-02-06 NOTE — Assessment & Plan Note (Signed)
Chronic and stable.  Repeat ultrasound pending.

## 2022-02-06 NOTE — Assessment & Plan Note (Signed)
Improved and within normal range per recent labs.

## 2022-02-06 NOTE — Patient Instructions (Signed)
It was a pleasure to see you today!   

## 2022-02-06 NOTE — Assessment & Plan Note (Signed)
Reviewed office notes from neurology in care everywhere.  Continue CPAP nightly.

## 2022-02-06 NOTE — Progress Notes (Signed)
Subjective:    Patient ID: Samantha Hall, female    DOB: 09-13-1961, 61 y.o.   MRN: [id number]  HPI  Raylan Troiani is a very pleasant 61 y.o. female with a history of hypertension, hyperlipidemia, chronic low back pain, elevated LFTs, prediabetes, liver lesion, kidney cysts, vitamin D deficiency who presents today for follow-up of chronic conditions.  1) Hypertension/OSA: Currently managed on hydrochlorothiazide 12.5 mg daily, metoprolol succinate 25 mg daily.  She is no longer taking hydrochlorothiazide 12.5 mg as a  Newer diagnosis of obstructive sleep apnea as of December 2022.  Sleep study revealed severe obstructive sleep apnea so it was recommended she undergo a "in lab sleep study".  Follows with neurology, last visit was from January 2023.  Managed on CPAP machine.  Following with cardiology, Dr. Clayborn Bigness, last office visit was a few weeks ago.  No changes to her regimen.  BP Readings from Last 3 Encounters:  02/06/22 124/62  07/29/21 122/80  06/18/21 (!) 133/9     2) Asthma: Diagnosed per pulmonology, symptoms since Covid-19 infection in June 2022. Currently prescribed Pulmicort nebulizer solution twice daily, albuterol nebulizer solution as needed, albuterol inhaler as needed, Singulair 10 mg daily. She's tried to wean off Pulmicort several times but cannot as her cough returns immediately.   She does experience chest heaviness intermittently, relief with albuterol inhaler. She has a follow up visit scheduled with her pulmonologist today.  Family history of lung cancer in maternal grandfather and a "spot" in the lungs of her maternal aunt. She underwent CT chest in November 2022 which revealed left lower lobe scarring, otherwise negative.   3) Hepatic and Renal Cysts: Chronic.  Subsequent abdominal ultrasounds have revealed stable cyst in the left lobe of the liver without new lesions, and simple cyst to the right kidney without changes.  She has requested repeat  ultrasound for further monitoring which was ordered last week.  4) Headaches: Chronic and intermittent for months. Evaluated by neurology for headaches that were occurring in the morning. She underwent sleep study, is now on CPAP, and the headaches have improved. She was prescribed sumatriptan and rizatriptan, no use in the last 1 month.   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 Now her occupation is requiring her to return to the office one day weekly, she does not feel comfortable, especially given her chronic symptoms of cough and brain fog secondary to Covid-19 infection last year.   She is requesting a note for accomodation to remain working from home. She can do her job effectively while working from home. She could come into the office for 1-2 hours on an as needed basis. She does conduct team meetings once monthly.    Review of Systems  Constitutional:  Negative for fever.  Respiratory:  Positive for cough.   Neurological:  Negative for dizziness and headaches.  Psychiatric/Behavioral:  The patient is not nervous/anxious.         Past Medical History:  Diagnosis Date   Elevated antinuclear antibody (ANA) level    Negative workup for Lupus.   Fibroids    Fracture of left patella 04/30/2018   Hyperlipidemia    Hypertension    Controlled   Osteoarthritis    Sciatica    Vitamin D deficiency     Social History   Socioeconomic History   Marital status: Married    Spouse name: Not on file   Number of children: Not on file   Years of education: Not on file  Highest education level: Not on file  Occupational History   Not on file  Tobacco Use   Smoking status: Never   Smokeless tobacco: Never  Vaping Use   Vaping Use: Never used  Substance and Sexual Activity   Alcohol use: Yes    Alcohol/week: 0.0 standard drinks    Comment: sociall   Drug use: No   Sexual activity: Yes    Birth control/protection: Surgical    Comment: Hysterectomy   Other Topics Concern   Not on file  Social History Narrative   Married.   Highest level of education Bachelors.    Works as a Education officer, museum.   Social Determinants of Health   Financial Resource Strain: Not on file  Food Insecurity: Not on file  Transportation Needs: Not on file  Physical Activity: Not on file  Stress: Not on file  Social Connections: Not on file  Intimate Partner Violence: Not on file    Past Surgical History:  Procedure Laterality Date   ABDOMINAL HYSTERECTOMY  2002   CHOLECYSTECTOMY  2015   TUBAL LIGATION  1984    Family History  Problem Relation Age of Onset   Diabetes Mother    Heart disease Mother    Heart disease Father     Allergies  Allergen Reactions   Cefazolin Swelling and Anaphylaxis    Difficulty breathing   Lidocaine Swelling   Latex Rash    Contact rash   Other Rash    Contact rash   Sulfa Antibiotics Rash    Current Outpatient Medications on File Prior to Visit  Medication Sig Dispense Refill   albuterol (PROVENTIL) (2.5 MG/3ML) 0.083% nebulizer solution Take 3 mLs (2.5 mg total) by nebulization every 4 (four) hours as needed for wheezing or shortness of breath. 75 mL 1   albuterol (VENTOLIN HFA) 108 (90 Base) MCG/ACT inhaler Inhale 2 puffs into the lungs every 6 (six) hours as needed for shortness of breath. 1 each 1   aspirin 81 MG EC tablet Take by mouth.     b complex vitamins tablet Take 1 tablet by mouth daily.     budesonide (PULMICORT) 0.5 MG/2ML nebulizer solution Take 2 mLs (0.5 mg total) by nebulization in the morning and at bedtime. 60 mL 0   chlorpheniramine-HYDROcodone (TUSSIONEX PENNKINETIC ER) 10-8 MG/5ML SUER Take 5 mLs by mouth 2 (two) times daily. 70 mL 0   Cholecalciferol (VITAMIN D) 2000 units CAPS Take by mouth.     estradiol (ESTRACE VAGINAL) 0.1 MG/GM vaginal cream Place 1 Applicatorful vaginally 3 (three) times a week. 42.5 g 3   fexofenadine (ALLEGRA) 180 MG tablet Take 180 mg by mouth daily as needed.      Nebulizers (COMPRESSOR/NEBULIZER) MISC 1 Units by Does not apply route every 4 (four) hours as needed. 1 each 0   olopatadine (PATANOL) 0.1 % ophthalmic solution Place 1 drop into both eyes 2 (two) times daily as needed for allergies. 5 mL 1   rizatriptan (MAXALT-MLT) 10 MG disintegrating tablet 1 tablet at onset of migraine. May repeat x 1 after 2 hours.     Spacer/Aero-Holding Dorise Bullion Use as directed with albuterol inhaler 1 Units 0   SUMAtriptan (IMITREX) 25 MG tablet PLEASE SEE ATTACHED FOR DETAILED DIRECTIONS     triamcinolone cream (KENALOG) 0.5 % APPLY TO AFFECTED AREA TWICE A DAY 30 g 0   TURMERIC PO Take 4 capsules by mouth daily.     hydrOXYzine (ATARAX/VISTARIL) 10 MG tablet TAKE 1 CAPSULE BY  MOUTH UP TO TWICE DAILY AS NEEDED FOR ANXIETY. (Patient not taking: Reported on 02/06/2022) 30 tablet 0   metoprolol succinate (TOPROL-XL) 25 MG 24 hr tablet Take 25 mg by mouth daily.     montelukast (SINGULAIR) 10 MG tablet Take 10 mg by mouth at bedtime.     predniSONE (DELTASONE) 20 MG tablet Take 20 mg by mouth daily.     valACYclovir (VALTREX) 500 MG tablet Take 500 mg by mouth 2 (two) times daily as needed.     No current facility-administered medications on file prior to visit.    BP 124/62    Pulse 62    Temp 98.6 F (37 C) (Temporal)    Ht 5\' 5"  (1.651 m)    Wt 169 lb (76.7 kg)    SpO2 98%    BMI 28.12 kg/m  Objective:   Physical Exam Cardiovascular:     Rate and Rhythm: Normal rate and regular rhythm.  Pulmonary:     Effort: Pulmonary effort is normal.     Breath sounds: Normal breath sounds.     Comments: Dry cough noted a few times during visit Musculoskeletal:     Cervical back: Neck supple.  Skin:    General: Skin is warm and dry.  Psychiatric:        Mood and Affect: Mood normal.          Assessment & Plan:      This visit occurred during the SARS-CoV-2 public health emergency.  Safety protocols were in place, including screening questions prior to  the visit, additional usage of staff PPE, and extensive cleaning of exam room while observing appropriate contact time as indicated for disinfecting solutions.

## 2022-02-06 NOTE — Assessment & Plan Note (Signed)
Controlled.  Continue hydroxyzine 10 mg as needed

## 2022-02-06 NOTE — Assessment & Plan Note (Signed)
Controlled.  Continue metoprolol succinate 25 mg daily.  Reviewed office notes from cardiology and neurology from [facility]where.

## 2022-02-06 NOTE — Assessment & Plan Note (Signed)
Improved since initiation of CPAP nightly.  She is not on both sumatriptan and rizatriptan, she will notify us which medication she is taking as needed.  Following with neurology.

## 2022-02-06 NOTE — Assessment & Plan Note (Addendum)
Following with pulmonology. PFTs from 2022 reviewed through care everywhere.  Continue Pulmicort nebulizer treatment twice daily, albuterol nebulizer as needed.  Agree to provide work accommodation note for her to continue working from home as she has done so effectively over the last several years.

## 2022-02-06 NOTE — Assessment & Plan Note (Signed)
Stable and unchanged compared to prior abdominal ultrasounds.  Repeat abdominal ultrasound pending per patient request.

## 2022-02-06 NOTE — Assessment & Plan Note (Signed)
Improved with recent A1c of 5.9.  Continue to work on lifestyle changes.

## 2022-02-07 ENCOUNTER — Encounter: Payer: Self-pay | Admitting: Primary Care

## 2022-02-08 ENCOUNTER — Ambulatory Visit
Admission: RE | Admit: 2022-02-08 | Discharge: 2022-02-08 | Disposition: A | Discharge status: Home / Self Care | Payer: Managed Care, Other (non HMO) | Source: Ambulatory Visit | Attending: Primary Care | Admitting: Primary Care

## 2022-02-08 DIAGNOSIS — K769 Liver disease, unspecified: Secondary | ICD-10-CM

## 2022-02-08 DIAGNOSIS — N289 Disorder of kidney and ureter, unspecified: Secondary | ICD-10-CM

## 2022-02-14 NOTE — Telephone Encounter (Signed)
Samantha Hall, can you print and stamp the letter I last wrote for her work accomodation? She would like to pick up a copy and also for a copy to be sent to her employer.

## 2022-02-16 NOTE — Telephone Encounter (Signed)
Called patient she wants to hold off on printed she want to call and verify that is what is needed and will call us back.  ?

## 2022-02-17 NOTE — Telephone Encounter (Signed)
Patient needs letter  for her HR that recommends  that she has long covid with residual effects and instead of saying exclusively work from home needs to say that she recommends avoiding lage groups and can come into the office for 1-2 hours at a time. Should have something in there that recommends not being in close proximity  with people for extend period of time.  ?

## 2022-02-22 ENCOUNTER — Other Ambulatory Visit: Payer: Self-pay | Admitting: Primary Care

## 2022-02-22 DIAGNOSIS — U099 Post covid-19 condition, unspecified: Secondary | ICD-10-CM

## 2022-03-10 ENCOUNTER — Telehealth: Payer: Self-pay

## 2022-03-10 DIAGNOSIS — M545 Low back pain, unspecified: Secondary | ICD-10-CM

## 2022-03-10 MED ORDER — DICLOFENAC SODIUM 75 MG PO TBEC: 75.0000 mg | DELAYED_RELEASE_TABLET | Freq: Two times a day (BID) | ORAL | 0 refills | Status: DC | PRN

## 2022-03-10 NOTE — Telephone Encounter (Signed)
Patient called request refill on Diclofenac that she takes PRN for leg  pain.  ?[facility] [place]  ?

## 2022-03-10 NOTE — Telephone Encounter (Signed)
Noted. Refill(s) sent to pharmacy.  

## 2022-03-10 NOTE — Addendum Note (Signed)
Addended by: Pleas Koch on: 03/10/2022 10:21 AM ? ? Modules accepted: Orders ? ?

## 2022-03-22 ENCOUNTER — Other Ambulatory Visit: Payer: Self-pay | Admitting: Primary Care

## 2022-03-22 DIAGNOSIS — U099 Post covid-19 condition, unspecified: Secondary | ICD-10-CM

## 2022-03-30 ENCOUNTER — Other Ambulatory Visit: Payer: Self-pay | Admitting: Primary Care

## 2022-03-30 DIAGNOSIS — G8929 Other chronic pain: Secondary | ICD-10-CM

## 2022-04-07 ENCOUNTER — Other Ambulatory Visit: Payer: Self-pay | Admitting: Family

## 2022-04-07 ENCOUNTER — Encounter: Payer: Self-pay | Admitting: Family

## 2022-04-07 ENCOUNTER — Ambulatory Visit
Admission: RE | Admit: 2022-04-07 | Discharge: 2022-04-07 | Disposition: A | Discharge status: Home / Self Care | Payer: Managed Care, Other (non HMO) | Source: Ambulatory Visit | Attending: Family | Admitting: Family

## 2022-04-07 ENCOUNTER — Ambulatory Visit: Payer: Managed Care, Other (non HMO) | Admitting: Family

## 2022-04-07 ENCOUNTER — Telehealth: Payer: Self-pay

## 2022-04-07 VITALS — BP 118/66 | HR 55 | Temp 98.3°F | Resp 16 | Ht 65.0 in | Wt 168.5 lb

## 2022-04-07 DIAGNOSIS — R1013 Epigastric pain: Secondary | ICD-10-CM | POA: Diagnosis not present

## 2022-04-07 DIAGNOSIS — R1031 Right lower quadrant pain: Secondary | ICD-10-CM | POA: Insufficient documentation

## 2022-04-07 DIAGNOSIS — R300 Dysuria: Secondary | ICD-10-CM | POA: Diagnosis not present

## 2022-04-07 DIAGNOSIS — N3000 Acute cystitis without hematuria: Secondary | ICD-10-CM

## 2022-04-07 LAB — AMYLASE: Amylase: 56 U/L (ref 27–131)

## 2022-04-07 LAB — COMPREHENSIVE METABOLIC PANEL
ALT: 30 U/L (ref 0–35)
AST: 23 U/L (ref 0–37)
Albumin: 4.6 g/dL (ref 3.5–5.2)
Alkaline Phosphatase: 71 U/L (ref 39–117)
BUN: 17 mg/dL (ref 6–23)
CO2: 31 mEq/L (ref 19–32)
Calcium: 9.6 mg/dL (ref 8.4–10.5)
Chloride: 98 mEq/L (ref 96–112)
Creatinine, Ser: 1.02 mg/dL (ref 0.40–1.20)
GFR: 59.24 mL/min — ABNORMAL LOW (ref >60.00)
Glucose, Bld: 100 mg/dL — ABNORMAL HIGH (ref 70–99)
Potassium: 4.1 mEq/L (ref 3.5–5.1)
Sodium: 137 mEq/L (ref 135–145)
Total Bilirubin: 0.7 mg/dL (ref 0.2–1.2)
Total Protein: 7.4 g/dL (ref 6.0–8.3)

## 2022-04-07 LAB — POC URINALSYSI DIPSTICK (AUTOMATED)
Bilirubin, UA: NEGATIVE
Blood, UA: NEGATIVE
Glucose, UA: NEGATIVE
Ketones, UA: NEGATIVE
Leukocytes, UA: NEGATIVE
Nitrite, UA: NEGATIVE
Protein, UA: NEGATIVE
Spec Grav, UA: <=1.005 — AB (ref 1.010–1.025)
Urobilinogen, UA: 0.2 E.U./dL
pH, UA: 7 (ref 5.0–8.0)

## 2022-04-07 LAB — CBC
HCT: 42 % (ref 36.0–46.0)
Hemoglobin: 13.9 g/dL (ref 12.0–15.0)
MCHC: 33.2 g/dL (ref 30.0–36.0)
MCV: 91.7 fl (ref 78.0–100.0)
Platelets: 245 10*3/uL (ref 150.0–400.0)
RBC: 4.58 Mil/uL (ref 3.87–5.11)
RDW: 14 % (ref 11.5–15.5)
WBC: 4.3 10*3/uL (ref 4.0–10.5)

## 2022-04-07 LAB — LIPASE: Lipase: 33 U/L (ref 11.0–59.0)

## 2022-04-07 LAB — H. PYLORI ANTIBODY, IGG: H Pylori IgG: NEGATIVE

## 2022-04-07 MED ORDER — OMEPRAZOLE 20 MG PO CPDR: 20.0000 mg | DELAYED_RELEASE_CAPSULE | Freq: Every day | ORAL | 0 refills | Status: DC

## 2022-04-07 MED ORDER — IOHEXOL 300 MG/ML  SOLN
100.0000 mL | Freq: Once | INTRAMUSCULAR | Status: AC | PRN
  Administered 2022-04-07: 100 mL via INTRAVENOUS

## 2022-04-07 MED ORDER — CIPROFLOXACIN HCL 500 MG PO TABS: 500.0000 mg | ORAL_TABLET | Freq: Two times a day (BID) | ORAL | 0 refills | Status: AC

## 2022-04-07 NOTE — Addendum Note (Signed)
Addended by: Carter Kitten on: 04/07/2022 11:53 AM ? ? Modules accepted: Orders ? ?

## 2022-04-07 NOTE — Progress Notes (Signed)
? ?Established Patient Office Visit ? ?Subjective:  ?Patient ID: Samantha Hall, female    DOB: [birth year]  Age: 61 y.o. MRN: [id number] ? ?CC:  ?Chief Complaint  ?Patient presents with  ? Flank Pain  ?  Rights side started yesterday  ? ? ?HPI ?Samantha Hall is here today with concerns.  ? ?Last few days with right lower abdominal pain, doesn't go around to the back. The back does feel a little tight she states. The sensation is not burning/or tingling. It is more of a gnawing tightness. It comes and goes. At worst, the pain can be 8/10.  ? ?No fever or chills.  ?No diarrhea no constipation.  ?Slight nausea. No vomiting.  ?Is noticing she is feeling clammy here and there.  ? ?No vaginal discharge. No urinary frequency,  and no urgency. Slight dysuria with urinating.  ?Some weakness when standing up at time and feeling washed out, not feeling at her best. ? ?Past Medical History:  ?Diagnosis Date  ? Elevated antinuclear antibody (ANA) level   ? Negative workup for Lupus.  ? Fibroids   ? Fracture of left patella 04/30/2018  ? Hyperlipidemia   ? Hypertension   ? Controlled  ? Osteoarthritis   ? Pain of upper abdomen 12/27/2018  ? Sciatica   ? Vitamin D deficiency   ? ? ?Past Surgical History:  ?Procedure Laterality Date  ? ABDOMINAL HYSTERECTOMY  2002  ? CHOLECYSTECTOMY  2015  ? TUBAL LIGATION  1984  ? ? ?Family History  ?Problem Relation Age of Onset  ? Diabetes Mother   ? Heart disease Mother   ? Heart disease Father   ? ? ?Social History  ? ?Socioeconomic History  ? Marital status: Married  ?  Spouse name: Not on file  ? Number of children: Not on file  ? Years of education: Not on file  ? Highest education level: Not on file  ?Occupational History  ? Not on file  ?Tobacco Use  ? Smoking status: Never  ? Smokeless tobacco: Never  ?Vaping Use  ? Vaping Use: Never used  ?Substance and Sexual Activity  ? Alcohol use: Yes  ?  Alcohol/week: 0.0 standard drinks  ?  Comment: sociall  ? Drug use: No  ? Sexual activity:  Yes  ?  Birth control/protection: Surgical  ?  Comment: Hysterectomy  ?Other Topics Concern  ? Not on file  ?Social History Narrative  ? Married.  ? Highest level of education Bachelors.   ? Works as a Education officer, museum.  ? ?Social Determinants of Health  ? ?Financial Resource Strain: Not on file  ?Food Insecurity: Not on file  ?Transportation Needs: Not on file  ?Physical Activity: Not on file  ?Stress: Not on file  ?Social Connections: Not on file  ?Intimate Partner Violence: Not on file  ? ? ?Outpatient Medications Prior to Visit  ?Medication Sig Dispense Refill  ? albuterol (PROVENTIL) (2.5 MG/3ML) 0.083% nebulizer solution Take 3 mLs (2.5 mg total) by nebulization every 4 (four) hours as needed for wheezing or shortness of breath. 75 mL 1  ? albuterol (VENTOLIN HFA) 108 (90 Base) MCG/ACT inhaler INHALE 2 PUFFS INTO THE LUNGS EVERY 6 HOURS AS NEEDED FOR SHORTNESS OF BREATH 6.7 each 0  ? aspirin 81 MG EC tablet Take by mouth.    ? b complex vitamins tablet Take 1 tablet by mouth daily.    ? budesonide (PULMICORT) 0.5 MG/2ML nebulizer solution Take 2 mLs (0.5 mg total) by nebulization in the  morning and at bedtime. 60 mL 0  ? chlorpheniramine-HYDROcodone (TUSSIONEX PENNKINETIC ER) 10-8 MG/5ML SUER Take 5 mLs by mouth 2 (two) times daily. 70 mL 0  ? Cholecalciferol (VITAMIN D) 2000 units CAPS Take by mouth.    ? diclofenac (VOLTAREN) 75 MG EC tablet Take 1 tablet (75 mg total) by mouth 2 (two) times daily as needed (for leg pain). 60 tablet 0  ? estradiol (ESTRACE VAGINAL) 0.1 MG/GM vaginal cream Place 1 Applicatorful vaginally 3 (three) times a week. 42.5 g 3  ? fexofenadine (ALLEGRA) 180 MG tablet Take 180 mg by mouth daily as needed.    ? hydrOXYzine (ATARAX/VISTARIL) 10 MG tablet TAKE 1 CAPSULE BY MOUTH UP TO TWICE DAILY AS NEEDED FOR ANXIETY. 30 tablet 0  ? losartan (COZAAR) 25 MG tablet Take 25 mg by mouth daily.    ? metoprolol succinate (TOPROL-XL) 25 MG 24 hr tablet Take 25 mg by mouth daily.    ? montelukast  (SINGULAIR) 10 MG tablet Take 10 mg by mouth at bedtime.    ? Nebulizers (COMPRESSOR/NEBULIZER) MISC 1 Units by Does not apply route every 4 (four) hours as needed. 1 each 0  ? olopatadine (PATANOL) 0.1 % ophthalmic solution Place 1 drop into both eyes 2 (two) times daily as needed for allergies. 5 mL 1  ? potassium chloride (KLOR-CON) 10 MEQ tablet Take 10 mEq by mouth daily.    ? rizatriptan (MAXALT-MLT) 10 MG disintegrating tablet 1 tablet at onset of migraine. May repeat x 1 after 2 hours.    ? Spacer/Aero-Holding Dorise Bullion Use as directed with albuterol inhaler 1 Units 0  ? SUMAtriptan (IMITREX) 25 MG tablet PLEASE SEE ATTACHED FOR DETAILED DIRECTIONS    ? triamcinolone cream (KENALOG) 0.5 % APPLY TO AFFECTED AREA TWICE A DAY 30 g 0  ? TURMERIC PO Take 4 capsules by mouth daily.    ? valACYclovir (VALTREX) 500 MG tablet Take 500 mg by mouth 2 (two) times daily as needed.    ? predniSONE (DELTASONE) 20 MG tablet Take 20 mg by mouth daily. (Patient not taking: Reported on 04/07/2022)    ? ?No facility-administered medications prior to visit.  ? ? ?Allergies  ?Allergen Reactions  ? Cefazolin Swelling and Anaphylaxis  ?  Difficulty breathing  ? Lidocaine Swelling  ? Latex Rash  ?  Contact rash  ? Other Rash  ?  Contact rash  ? Sulfa Antibiotics Rash  ? ? ?ROS ?Review of Systems  ?Constitutional:  Positive for diaphoresis (clammy feeling at times) and fatigue. Negative for chills and fever.  ?Respiratory:  Negative for cough and shortness of breath.   ?Cardiovascular:  Negative for chest pain and leg swelling.  ?Gastrointestinal:  Positive for abdominal pain (right abdominal pain, lower on and off). Negative for blood in stool, constipation, diarrhea, nausea and vomiting.  ?Genitourinary:  Positive for dysuria and frequency (slight). Negative for difficulty urinating, flank pain, urgency, vaginal discharge and vaginal pain.  ?Psychiatric/Behavioral:  Negative for agitation and sleep disturbance.   ?All other  systems reviewed and are negative. ? ?  ?Objective:  ?  ?Physical Exam ?Constitutional:   ?   General: She is not in acute distress. ?   Appearance: Normal appearance. She is obese. She is not ill-appearing, toxic-appearing or diaphoretic.  ?HENT:  ?   Right Ear: Tympanic membrane normal.  ?   Left Ear: Tympanic membrane normal.  ?   Nose: Nose normal.  ?   Mouth/Throat:  ?  Mouth: Mucous membranes are moist.  ?Eyes:  ?   Pupils: Pupils are equal, round, and reactive to light.  ?Cardiovascular:  ?   Rate and Rhythm: Normal rate and regular rhythm.  ?Pulmonary:  ?   Effort: Pulmonary effort is normal.  ?   Breath sounds: Normal breath sounds.  ?Abdominal:  ?   General: Abdomen is flat. Bowel sounds are normal. There is no distension.  ?   Palpations: Abdomen is soft.  ?   Tenderness: There is abdominal tenderness (right lower quadrant and also epigastric tenderness). There is guarding and rebound (rlq).  ?Neurological:  ?   Mental Status: She is alert.  ? ? ?BP 118/66   Pulse (!) 55   Temp 98.3 ?F (36.8 ?C)   Resp 16   Ht '5\' 5"'$  (1.651 m)   Wt 168 lb 8 oz (76.4 kg)   SpO2 98%   BMI 28.04 kg/m?  ?Wt Readings from Last 3 Encounters:  ?04/07/22 168 lb 8 oz (76.4 kg)  ?02/06/22 169 lb (76.7 kg)  ?07/29/21 169 lb 3.2 oz (76.7 kg)  ? ? ? ?Health Maintenance Due  ?Topic Date Due  ? COVID-19 Vaccine (4 - Booster for Pfizer series) 12/24/2020  ? ? ?There are no preventive care reminders to display for this patient. ? ?Lab Results  ?Component Value Date  ? TSH 1.409 08/16/2017  ? ?Lab Results  ?Component Value Date  ? WBC 4.8 06/17/2021  ? HGB 13.8 06/17/2021  ? HCT 39.9 06/17/2021  ? MCV 89.3 06/17/2021  ? PLT 292 06/17/2021  ? ?Lab Results  ?Component Value Date  ? NA 140 01/31/2022  ? K 3.7 01/31/2022  ? CO2 28 01/31/2022  ? GLUCOSE 82 01/31/2022  ? BUN 16 01/31/2022  ? CREATININE 0.95 01/31/2022  ? BILITOT 0.7 01/31/2022  ? ALKPHOS 74 01/31/2022  ? AST 20 01/31/2022  ? ALT 29 01/31/2022  ? PROT 7.3 01/31/2022  ?  ALBUMIN 4.3 01/31/2022  ? CALCIUM 9.1 01/31/2022  ? ANIONGAP 8 06/17/2021  ? GFR 64.60 01/31/2022  ? ?Lab Results  ?Component Value Date  ? HGBA1C 5.9 01/31/2022  ? ? ?  ?Assessment & Plan:  ? ?Problem List Items Add

## 2022-04-07 NOTE — Assessment & Plan Note (Signed)
Ordering h pylori and alpha gal pending results ?Trial omeprazole 20 mg  ?Try to decrease and or avoid spicy foods, fried fatty foods, and also caffeine and chocolate as these can increase heartburn symptoms.  ? ?

## 2022-04-07 NOTE — Assessment & Plan Note (Signed)
Stat CT abd pelvis ordered pending results ?If pain worsens go to er or call 911 ?Labs today for abdominal workup ?R/o appendicitis vs gastritis ? ?

## 2022-04-07 NOTE — Assessment & Plan Note (Signed)
R/o uti as well  ?Urine culture and poct dip in office  ?Reviewed and pending ?

## 2022-04-07 NOTE — Telephone Encounter (Signed)
This patient is calling wanting to know if you have received her Ct scan results? Please advise? And patient has question about speck g- grave? Please advise

## 2022-04-07 NOTE — Patient Instructions (Signed)
Trial omeprazole for epigastric pain (mid abdominal pain) ?Please start trial of omeprazole as prescribed. Take this medication for two weeks, administer 30 minutes prior to breakfast each am. Try to decrease and or avoid spicy foods, fried fatty foods, and also caffeine and chocolate as these can increase heartburn symptoms.  ? ?Due to recent changes in healthcare laws, you may see results of your imaging and/or laboratory studies on MyChart before I have had a chance to review them.  I understand that in some cases there may be results that are confusing or concerning to you. Please understand that not all results are received at the same time and often I may need to interpret multiple results in order to provide you with the best plan of care or course of treatment. Therefore, I ask that you please give me 2 business days to thoroughly review all your results before contacting my office for clarification. Should we see a critical lab result, you will be contacted sooner.  ? ?It was a pleasure seeing you today! Please do not hesitate to reach out with any questions and or concerns. ? ?Regards,  ? ?Roan Sawchuk ?FNP-C ? ?

## 2022-04-08 LAB — URINE CULTURE
MICRO NUMBER:: 13296156
SPECIMEN QUALITY:: ADEQUATE

## 2022-04-10 NOTE — Telephone Encounter (Signed)
See note below access nurse note; pt has spoken with Eugenia Pancoast FNP this morning; sending note to Red Christians FNP as FYI. ? ?[facility] - Client ?TELEPHONE ADVICE RECORD ?AccessNurse? ?Patient ?Name: ?Samantha Hall ?ILLIAMS ?Gender: Female ?DOB: 05-25-1961 ?Age: 61 Y 10 M 1 D ?Return ?Phone ?Number: ?[id number] ?(Primary) ?Address: ?City/ ?State/ ?Zip: ?[place] ?[address] ?Client [facility] - Client ?Client Site [place] ?Provider AA - PHYSICIAN, UNKNOWN- MD ?Contact Type Call ?Who Is Calling Lab / Radiology ?Lab Name [facility] CT ?Lab Phone Number [phone] ?Lab Tech Name Jeani Hawking ?Lab Reference Number no ?Chief Complaint Lab Result (Critical or Stat) ?Call Type Lab Send to RN ?Reason for Call Report lab results ?Initial Comment Caller reporting CT findings. ?Translation No ?Nurse Assessment ?Nurse: Eugenio Hoes, RN, Jenny Reichmann Date/Time [name] Time): 04/07/2022 5:44:36 PM ?Is there an on-call provider listed? ---Yes ?Please list name of person reporting value (Lab ?Employee) and a contact number. ---Jeani Hawking from [facility] CT [phone] ?Please document the following items: Lab name Lab ?value (read back to lab to verify) Reference range ?for lab value Date and time blood was drawn Collect ?time of birth for bilirubin results ?---CT Scan Results-No evidence of appendicitis or ?other acute findings. CT scan 04/07/2022 4:30PM ?Please collect the patient contact information from ?the lab. (name, phone number and address) ---Candida Peeling [phone] ?Disp. Time (Eastern ?Time) Disposition Final User ?04/07/2022 5:52:18 PM Called On-Call Provider Eugenio Hoes, RN, Jenny Reichmann ?04/07/2022 5:54:44 PM Clinical Call Yes Eugenio Hoes, RN, Jenny Reichmann ?Paging ?DoctorName Phone DateTime Result/ ?Outcome Message Type Notes ?Copland, Spencer - ?MD [phone] ?04/07/2022 ?5:52:18 ?PM ?Called On ?Call Provider - ?Reached ?Doctor Paged ?PLEASE NOTE: All timestamps contained within this report  are represented as [place] Standard Time. ?CONFIDENTIALTY NOTICE: This fax transmission is intended only for the addressee. It contains information that is legally privileged, confidential or ?otherwise protected from use or disclosure. If you are not the intended recipient, you are strictly prohibited from reviewing, disclosing, copying using ?or disseminating any of this information or taking any action in reliance on or regarding this information. If you have received this fax in error, please ?notify us immediately by telephone so that we can arrange for its return to [place]. Phone: [phone], Toll-Free: [phone], Fax: [phone] ?Page: 2 of 2 ?Call Id: [id number] ?Paging ?DoctorName Phone DateTime Result/ ?Outcome Message Type Notes ?Copland, Spencer - ?MD ?04/07/2022 ?5:52:55 ?PM ?Spoke with On ?Call - General Message Result ?Reported CT scan results to MD; ?MD states that it is okay for ?patient to go home ?

## 2022-04-10 NOTE — Telephone Encounter (Signed)
Called and spoke with patient friday 4/21 in regards to her CT scan results. Please refer to CT imaging note. ?

## 2022-04-11 LAB — ALPHA-GAL PANEL
Allergen, Mutton, f88: <0.1 kU/L
Allergen, Pork, f26: <0.1 kU/L
Beef: <0.1 kU/L
CLASS: 0
CLASS: 0
Class: 0
GALACTOSE-ALPHA-1,3-GALACTOSE IGE*: <0.1 kU/L (ref <0.10)

## 2022-04-11 LAB — INTERPRETATION:

## 2022-04-20 NOTE — Progress Notes (Signed)
Patent did not view their my chart test result that I responded to one week ago.  Please call them and advise them of the below results.

## 2022-04-21 ENCOUNTER — Telehealth: Payer: Self-pay | Admitting: Primary Care

## 2022-04-21 NOTE — Telephone Encounter (Signed)
Spoke to Pt and gave lab results. ?

## 2022-04-21 NOTE — Telephone Encounter (Signed)
Pt is returning [facility] call from this morning. Pt is "unsure of what the reason for the call was."  ? ?Callback Number: [phone] ?

## 2022-04-29 ENCOUNTER — Other Ambulatory Visit: Payer: Self-pay | Admitting: Family

## 2022-04-29 DIAGNOSIS — R1013 Epigastric pain: Secondary | ICD-10-CM

## 2022-05-03 ENCOUNTER — Encounter: Payer: Managed Care, Other (non HMO) | Admitting: Primary Care

## 2022-05-03 ENCOUNTER — Encounter: Payer: Self-pay | Admitting: Primary Care

## 2022-05-03 ENCOUNTER — Ambulatory Visit (INDEPENDENT_AMBULATORY_CARE_PROVIDER_SITE_OTHER): Payer: Managed Care, Other (non HMO) | Admitting: Primary Care

## 2022-05-03 ENCOUNTER — Telehealth: Payer: Self-pay | Admitting: Primary Care

## 2022-05-03 VITALS — BP 120/62 | HR 62 | Temp 98.6°F | Ht 65.0 in | Wt 171.0 lb

## 2022-05-03 DIAGNOSIS — I10 Essential (primary) hypertension: Secondary | ICD-10-CM | POA: Diagnosis not present

## 2022-05-03 DIAGNOSIS — K769 Liver disease, unspecified: Secondary | ICD-10-CM | POA: Diagnosis not present

## 2022-05-03 DIAGNOSIS — E785 Hyperlipidemia, unspecified: Secondary | ICD-10-CM

## 2022-05-03 DIAGNOSIS — N289 Disorder of kidney and ureter, unspecified: Secondary | ICD-10-CM

## 2022-05-03 DIAGNOSIS — N952 Postmenopausal atrophic vaginitis: Secondary | ICD-10-CM

## 2022-05-03 DIAGNOSIS — R7989 Other specified abnormal findings of blood chemistry: Secondary | ICD-10-CM

## 2022-05-03 DIAGNOSIS — J4530 Mild persistent asthma, uncomplicated: Secondary | ICD-10-CM | POA: Diagnosis not present

## 2022-05-03 DIAGNOSIS — M1991 Primary osteoarthritis, unspecified site: Secondary | ICD-10-CM

## 2022-05-03 DIAGNOSIS — E559 Vitamin D deficiency, unspecified: Secondary | ICD-10-CM | POA: Diagnosis not present

## 2022-05-03 DIAGNOSIS — G8929 Other chronic pain: Secondary | ICD-10-CM

## 2022-05-03 DIAGNOSIS — R7303 Prediabetes: Secondary | ICD-10-CM

## 2022-05-03 DIAGNOSIS — F418 Other specified anxiety disorders: Secondary | ICD-10-CM

## 2022-05-03 DIAGNOSIS — Z0001 Encounter for general adult medical examination with abnormal findings: Secondary | ICD-10-CM | POA: Diagnosis not present

## 2022-05-03 DIAGNOSIS — M545 Low back pain, unspecified: Secondary | ICD-10-CM

## 2022-05-03 DIAGNOSIS — A6000 Herpesviral infection of urogenital system, unspecified: Secondary | ICD-10-CM | POA: Insufficient documentation

## 2022-05-03 MED ORDER — VALACYCLOVIR HCL 500 MG PO TABS: 500.0000 mg | ORAL_TABLET | Freq: Every day | ORAL | 0 refills | Status: DC

## 2022-05-03 MED ORDER — HYDROXYZINE HCL 10 MG PO TABS: 10.0000 mg | ORAL_TABLET | Freq: Two times a day (BID) | ORAL | 0 refills | Status: DC | PRN

## 2022-05-03 NOTE — Assessment & Plan Note (Signed)
Stable. ? ?Continue Estrace cream PRN. ?

## 2022-05-03 NOTE — Progress Notes (Signed)
? ?Subjective:  ? ? Patient ID: Samantha Hall, female    DOB: [birth year], 61 y.o.   MRN: [id number] ? ?HPI ? ?Lovely Kerins is a very pleasant 61 y.o. female who presents today for complete physical and follow up of chronic conditions. ? 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 Currently managed Valtrex 500 mg BID PRN for outbreaks per GYN. Today she would like to go on daily suppressive treatment as she's experiencing outbreaks once every 1-2 months.  ? ?She would also like to discuss situational anxiety. Symptoms of anxiousness, feeling uneasy, feeling nervous, feels trapped without a means of escape. This occurs in very enclosed spaces, stuck in traffic, and wide open spaces. Typically she will talk to herself to calm down. This began several years ago.  She has a prescription for hydroxyzine 10 mg tablets, has been out of refills for quite some time. ? ?Immunizations: ?-Tetanus: 2021 ?-Influenza: Did not complete this season ?-Covid-19: 3 vaccines ?-Shingles: Completed Shingrix series  ? ?Diet: Fair diet.  ?Exercise: Walking and doing the stationary bike and elliptical  ? ?Eye exam: Completes annually  ?Dental exam: Completes semi-annually  ? ?Pap Smear: Hysterectomy ?Mammogram: Completed in February 2023 ?Colonoscopy: Completed in 2021, due 2031 ? ?Wt Readings from Last 3 Encounters:  ?05/03/22 171 lb (77.6 kg)  ?04/07/22 168 lb 8 oz (76.4 kg)  ?02/06/22 169 lb (76.7 kg)  ? ? ? ?BP Readings from Last 3 Encounters:  ?05/03/22 120/62  ?04/07/22 118/66  ?02/06/22 124/62  ? ? ? ?Review of Systems  ?Constitutional:  Negative for unexpected weight change.  ?HENT:  Negative for rhinorrhea.   ?Respiratory:  Negative for cough and shortness of breath.   ?Cardiovascular:  Negative for chest pain.  ?Gastrointestinal:  Positive for constipation. Negative for diarrhea.  ?Genitourinary:  Negative for difficulty urinating.  ?Musculoskeletal:  Negative for arthralgias.   ?Skin:  Negative for rash.  ?Allergic/Immunologic: Negative for environmental allergies.  ?Neurological:  Negative for dizziness and headaches.  ?Psychiatric/Behavioral:  The patient is nervous/anxious.   ? ?   ? ? ?Past Medical History:  ?Diagnosis Date  ? Elevated antinuclear antibody (ANA) level   ? Negative workup for Lupus.  ? Fibroids   ? Fracture of left patella 04/30/2018  ? Hyperlipidemia   ? Hypertension   ? Controlled  ? Osteoarthritis   ? Pain of upper abdomen 12/27/2018  ? Sciatica   ? Vitamin D deficiency   ? ? ?Social History  ? ?Socioeconomic History  ? Marital status: Married  ?  Spouse name: Not on file  ? Number of children: Not on file  ? Years of education: Not on file  ? Highest education level: Not on file  ?Occupational History  ? Not on file  ?Tobacco Use  ? Smoking status: Never  ? Smokeless tobacco: Never  ?Vaping Use  ? Vaping Use: Never used  ?Substance and Sexual Activity  ? Alcohol use: Yes  ?  Alcohol/week: 0.0 standard drinks  ?  Comment: sociall  ? Drug use: No  ? Sexual activity: Yes  ?  Birth control/protection: Surgical  ?  Comment: Hysterectomy  ?Other Topics Concern  ? Not on file  ?Social History Narrative  ? Married.  ? Highest level of education Bachelors.   ? Works as a Education officer, museum.  ? ?Social Determinants of Health  ? ?Financial Resource Strain: Not on file  ?Food Insecurity: Not on file  ?Transportation Needs:  Not on file  ?Physical Activity: Not on file  ?Stress: Not on file  ?Social Connections: Not on file  ?Intimate Partner Violence: Not on file  ? ? ?Past Surgical History:  ?Procedure Laterality Date  ? ABDOMINAL HYSTERECTOMY  2002  ? CHOLECYSTECTOMY  2015  ? TUBAL LIGATION  1984  ? ? ?Family History  ?Problem Relation Age of Onset  ? Diabetes Mother   ? Heart disease Mother   ? Heart disease Father   ? ? ?Allergies  ?Allergen Reactions  ? Cefazolin Swelling and Anaphylaxis  ?  Difficulty breathing  ? Lidocaine Swelling  ? Latex Rash  ?  Contact rash  ? Other Rash   ?  Contact rash  ? Sulfa Antibiotics Rash  ? ? ?Current Outpatient Medications on File Prior to Visit  ?Medication Sig Dispense Refill  ? albuterol (PROVENTIL) (2.5 MG/3ML) 0.083% nebulizer solution Take 3 mLs (2.5 mg total) by nebulization every 4 (four) hours as needed for wheezing or shortness of breath. 75 mL 1  ? albuterol (VENTOLIN HFA) 108 (90 Base) MCG/ACT inhaler INHALE 2 PUFFS INTO THE LUNGS EVERY 6 HOURS AS NEEDED FOR SHORTNESS OF BREATH 6.7 each 0  ? b complex vitamins tablet Take 1 tablet by mouth daily.    ? budesonide (PULMICORT) 0.5 MG/2ML nebulizer solution Take 2 mLs (0.5 mg total) by nebulization in the morning and at bedtime. 60 mL 0  ? Cholecalciferol (VITAMIN D) 2000 units CAPS Take by mouth.    ? diclofenac (VOLTAREN) 75 MG EC tablet Take 1 tablet (75 mg total) by mouth 2 (two) times daily as needed (for leg pain). 60 tablet 0  ? Dupilumab 300 MG/2ML SOPN Inject 300 mg into the skin every 14 (fourteen) days.    ? estradiol (ESTRACE VAGINAL) 0.1 MG/GM vaginal cream Place 1 Applicatorful vaginally 3 (three) times a week. 42.5 g 3  ? fexofenadine (ALLEGRA) 180 MG tablet Take 180 mg by mouth daily as needed.    ? losartan (COZAAR) 25 MG tablet Take 25 mg by mouth daily.    ? metoprolol succinate (TOPROL-XL) 25 MG 24 hr tablet Take 25 mg by mouth daily.    ? montelukast (SINGULAIR) 10 MG tablet Take 10 mg by mouth at bedtime.    ? Nebulizers (COMPRESSOR/NEBULIZER) MISC 1 Units by Does not apply route every 4 (four) hours as needed. 1 each 0  ? olopatadine (PATANOL) 0.1 % ophthalmic solution Place 1 drop into both eyes 2 (two) times daily as needed for allergies. 5 mL 1  ? omeprazole (PRILOSEC) 20 MG capsule Take 1 capsule (20 mg total) by mouth daily. 30 capsule 0  ? potassium chloride (KLOR-CON) 10 MEQ tablet Take 10 mEq by mouth daily.    ? Spacer/Aero-Holding Dorise Bullion Use as directed with albuterol inhaler 1 Units 0  ? SUMAtriptan (IMITREX) 25 MG tablet PLEASE SEE ATTACHED FOR DETAILED  DIRECTIONS    ? triamcinolone cream (KENALOG) 0.5 % APPLY TO AFFECTED AREA TWICE A DAY 30 g 0  ? TURMERIC PO Take 4 capsules by mouth daily.    ? aspirin 81 MG EC tablet Take by mouth. (Patient not taking: Reported on 05/03/2022)    ? ?No current facility-administered medications on file prior to visit.  ? ? ?BP 120/62   Pulse 62   Temp 98.6 ?F (37 ?C) (Oral)   Ht '5\' 5"'$  (1.651 m)   Wt 171 lb (77.6 kg)   SpO2 100%   BMI 28.46 kg/m?  ?Objective:  ?  Physical Exam ?HENT:  ?   Right Ear: Tympanic membrane and ear canal normal.  ?   Left Ear: Tympanic membrane and ear canal normal.  ?   Nose: Nose normal.  ?Eyes:  ?   Conjunctiva/sclera: Conjunctivae normal.  ?   Pupils: Pupils are equal, round, and reactive to light.  ?Neck:  ?   Thyroid: No thyromegaly.  ?Cardiovascular:  ?   Rate and Rhythm: Normal rate and regular rhythm.  ?   Heart sounds: No murmur heard. ?Pulmonary:  ?   Effort: Pulmonary effort is normal.  ?   Breath sounds: Normal breath sounds. No rales.  ?Abdominal:  ?   General: Bowel sounds are normal.  ?   Palpations: Abdomen is soft.  ?   Tenderness: There is no abdominal tenderness.  ?Musculoskeletal:     ?   General: Normal range of motion.  ?   Cervical back: Neck supple.  ?Lymphadenopathy:  ?   Cervical: No cervical adenopathy.  ?Skin: ?   General: Skin is warm and dry.  ?   Findings: No rash.  ?Neurological:  ?   Mental Status: She is alert and oriented to person, place, and time.  ?   Cranial Nerves: No cranial nerve deficit.  ?   Deep Tendon Reflexes: Reflexes are normal and symmetric.  ?Psychiatric:     ?   Mood and Affect: Mood normal.  ? ? ? ? ? ?   ?Assessment & Plan:  ? ? ? ? ?This visit occurred during the SARS-CoV-2 public health emergency.  Safety protocols were in place, including screening questions prior to the visit, additional usage of staff PPE, and extensive cleaning of exam room while observing appropriate contact time as indicated for disinfecting solutions.  ?

## 2022-05-03 NOTE — Assessment & Plan Note (Signed)
Following with pulmonology, now on Dupixent 300 mg every 2 weeks. ? ?Continue Dupixent 300 mg every 2 weeks, Pulmicort BID PRN, Allegra 180 mg daily, albuterol PRN, Singulair 10 mg HS. ? ? ?

## 2022-05-03 NOTE — Assessment & Plan Note (Signed)
Immunizations UTD. ?Mammogram UTD. ?Colonoscopy UTD, due 2031. ? ?Discussed the importance of a healthy diet and regular exercise in order for weight loss, and to reduce the risk of further co-morbidity. ? ?Exam today stable ?Labs pending. ?

## 2022-05-03 NOTE — Telephone Encounter (Signed)
Pt called and said she needs to speak with Kate's nurse, has some questions to ask her. Did not have a reason. Please return the call when possible. ? ?Callback Number: [phone] ?

## 2022-05-03 NOTE — Assessment & Plan Note (Signed)
Stable. ? ?Continue diclofenac 75 mg BID PRN. ?

## 2022-05-03 NOTE — Assessment & Plan Note (Signed)
Controlled. ? ?Repeat A1c pending. ?

## 2022-05-03 NOTE — Assessment & Plan Note (Addendum)
Controlled. ? ?Continue losartan 25 mg daily, metoprolol succinate 25 mg daily. ?BMP pending. ?

## 2022-05-03 NOTE — Assessment & Plan Note (Signed)
Chronic. ? ?Continue hydroxyzine 10 mg PRN.  ?Refills provided. ?

## 2022-05-03 NOTE — Patient Instructions (Signed)
Start valacyclovir 500 mg for recurrent outbreaks.  Take 1 tablet by mouth once daily for 3 months, then stop. ? ?Stop by the lab prior to leaving today. I will notify you of your results once received.  ? ?It was a pleasure to see you today! ? ?Preventive Care 62-62 Years Old, Female ?Preventive care refers to lifestyle choices and visits with your health care provider that can promote health and wellness. Preventive care visits are also called wellness exams. ?What can I expect for my preventive care visit? ?Counseling ?Your health care provider may ask you questions about your: ?Medical history, including: ?Past medical problems. ?Family medical history. ?Pregnancy history. ?Current health, including: ?Menstrual cycle. ?Method of birth control. ?Emotional well-being. ?Home life and relationship well-being. ?Sexual activity and sexual health. ?Lifestyle, including: ?Alcohol, nicotine or tobacco, and drug use. ?Access to firearms. ?Diet, exercise, and sleep habits. ?Work and work Statistician. ?Sunscreen use. ?Safety issues such as seatbelt and bike helmet use. ?Physical exam ?Your health care provider will check your: ?Height and weight. These may be used to calculate your BMI (body mass index). BMI is a measurement that tells if you are at a healthy weight. ?Waist circumference. This measures the distance around your waistline. This measurement also tells if you are at a healthy weight and may help predict your risk of certain diseases, such as type 2 diabetes and high blood pressure. ?Heart rate and blood pressure. ?Body temperature. ?Skin for abnormal spots. ?What immunizations do I need? ? ?Vaccines are usually given at various ages, according to a schedule. Your health care provider will recommend vaccines for you based on your age, medical history, and lifestyle or other factors, such as travel or where you work. ?What tests do I need? ?Screening ?Your health care provider may recommend screening tests for  certain conditions. This may include: ?Lipid and cholesterol levels. ?Diabetes screening. This is done by checking your blood sugar (glucose) after you have not eaten for a while (fasting). ?Pelvic exam and Pap test. ?Hepatitis B test. ?Hepatitis C test. ?HIV (human immunodeficiency virus) test. ?STI (sexually transmitted infection) testing, if you are at risk. ?Lung cancer screening. ?Colorectal cancer screening. ?Mammogram. Talk with your health care provider about when you should start having regular mammograms. This may depend on whether you have a family history of breast cancer. ?BRCA-related cancer screening. This may be done if you have a family history of breast, ovarian, tubal, or peritoneal cancers. ?Bone density scan. This is done to screen for osteoporosis. ?Talk with your health care provider about your test results, treatment options, and if necessary, the need for more tests. ?Follow these instructions at home: ?Eating and drinking ? ?Eat a diet that includes fresh fruits and vegetables, whole grains, lean protein, and low-fat dairy products. ?Take vitamin and mineral supplements as recommended by your health care provider. ?Do not drink alcohol if: ?Your health care provider tells you not to drink. ?You are pregnant, may be pregnant, or are planning to become pregnant. ?If you drink alcohol: ?Limit how much you have to 0-1 drink a day. ?Know how much alcohol is in your drink. In the [place] one drink equals one 12 oz bottle of beer (355 mL), one 5 oz glass of wine (148 mL), or one 1? oz glass of hard liquor (44 mL). ?Lifestyle ?Brush your teeth every morning and night with fluoride toothpaste. Floss one time each day. ?Exercise for at least 30 minutes 5 or more days each week. ?Do not use any  products that contain nicotine or tobacco. These products include cigarettes, chewing tobacco, and vaping devices, such as e-cigarettes. If you need help quitting, ask your health care provider. ?Do not use  drugs. ?If you are sexually active, practice safe sex. Use a condom or other form of protection to prevent STIs. ?If you do not wish to become pregnant, use a form of birth control. If you plan to become pregnant, see your health care provider for a prepregnancy visit. ?Take aspirin only as told by your health care provider. Make sure that you understand how much to take and what form to take. Work with your health care provider to find out whether it is safe and beneficial for you to take aspirin daily. ?Find healthy ways to manage stress, such as: ?Meditation, yoga, or listening to music. ?Journaling. ?Talking to a trusted person. ?Spending time with friends and family. ?Minimize exposure to UV radiation to reduce your risk of skin cancer. ?Safety ?Always wear your seat belt while driving or riding in a vehicle. ?Do not drive: ?If you have been drinking alcohol. Do not ride with someone who has been drinking. ?When you are tired or distracted. ?While texting. ?If you have been using any mind-altering substances or drugs. ?Wear a helmet and other protective equipment during sports activities. ?If you have firearms in your house, make sure you follow all gun safety procedures. ?Seek help if you have been physically or sexually abused. ?What's next? ?Visit your health care provider once a year for an annual wellness visit. ?Ask your health care provider how often you should have your eyes and teeth checked. ?Stay up to date on all vaccines. ?This information is not intended to replace advice given to you by your health care provider. Make sure you discuss any questions you have with your health care provider. ?Document Revised: 06/01/2021 Document Reviewed: 06/01/2021 ?Elsevier Patient Education ? [place]. ? ?

## 2022-05-03 NOTE — Assessment & Plan Note (Signed)
Stable and unchanged compared to prior ultrasounds and recent CT abdomen/pelvis. ?Discussed with patient today. ?

## 2022-05-03 NOTE — Assessment & Plan Note (Signed)
Repeat lipid panel pending. ?Not on treatment. ? ?The 10-year ASCVD risk score (Arnett DK, et al., 2019) is: 5.3% ?  Values used to calculate the score: ?    Age: 61 years ?    Sex: Female ?    Is Non-Hispanic African American: Yes ?    Diabetic: No ?    Tobacco smoker: No ?    Systolic Blood Pressure: 929 mmHg ?    Is BP treated: Yes ?    HDL Cholesterol: 83.8 mg/dL ?    Total Cholesterol: 220 mg/dL ? ?

## 2022-05-03 NOTE — Assessment & Plan Note (Signed)
Controlled. ? ?Continue diclofenac 75 mg BID. ?

## 2022-05-03 NOTE — Assessment & Plan Note (Signed)
Controlled. ? ?Repeat LFT's pending. ?Liver ultrasound and liver CT abdomen/pelvic are stable and without suspicious lesions.  ?

## 2022-05-03 NOTE — Assessment & Plan Note (Signed)
Stable, no change compared to prior ultrasounds and recent CT abdomen/pelvis. ?No further imaging warranted.  ?

## 2022-05-03 NOTE — Assessment & Plan Note (Signed)
Recurrent outbreaks. ? ?Start valacyclovir 500 mg daily x3 months, then stop. ?Patient will notify if symptoms return after discontinuation. ?

## 2022-05-04 ENCOUNTER — Other Ambulatory Visit: Payer: Self-pay

## 2022-05-04 DIAGNOSIS — A6000 Herpesviral infection of urogenital system, unspecified: Secondary | ICD-10-CM

## 2022-05-04 LAB — VITAMIN D 25 HYDROXY (VIT D DEFICIENCY, FRACTURES): VITD: 55.1 ng/mL (ref 30.00–100.00)

## 2022-05-04 LAB — LIPID PANEL
Cholesterol: 262 mg/dL — ABNORMAL HIGH (ref 0–200)
HDL: 97.5 mg/dL (ref >39.00)
LDL Cholesterol: 152 mg/dL — ABNORMAL HIGH (ref 0–99)
NonHDL: 164.1
Total CHOL/HDL Ratio: 3
Triglycerides: 63 mg/dL (ref 0.0–149.0)
VLDL: 12.6 mg/dL (ref 0.0–40.0)

## 2022-05-04 LAB — BASIC METABOLIC PANEL
BUN: 12 mg/dL (ref 6–23)
CO2: 30 mEq/L (ref 19–32)
Calcium: 9.1 mg/dL (ref 8.4–10.5)
Chloride: 99 mEq/L (ref 96–112)
Creatinine, Ser: 1.09 mg/dL (ref 0.40–1.20)
GFR: 54.68 mL/min — ABNORMAL LOW (ref >60.00)
Glucose, Bld: 91 mg/dL (ref 70–99)
Potassium: 3.8 mEq/L (ref 3.5–5.1)
Sodium: 135 mEq/L (ref 135–145)

## 2022-05-04 LAB — HEMOGLOBIN A1C: Hgb A1c MFr Bld: 6.2 % (ref 4.6–6.5)

## 2022-05-04 LAB — VITAMIN B12: Vitamin B-12: 731 pg/mL (ref 211–911)

## 2022-05-04 MED ORDER — VALACYCLOVIR HCL 500 MG PO TABS: 500.0000 mg | ORAL_TABLET | Freq: Every day | ORAL | 0 refills | Status: DC

## 2022-05-04 NOTE — Telephone Encounter (Signed)
Called pharmacy they have requested new script with  that removed from sig line. They have deactivated one that was sent yesterday. I have sent that new order in as requested. No further action needed.

## 2022-05-04 NOTE — Telephone Encounter (Signed)
Please call patient's pharmacy and have them remove "for herpes prevention" from the sig. Patient does not want this on her medication bottle.

## 2022-05-05 ENCOUNTER — Ambulatory Visit: Payer: Managed Care, Other (non HMO)

## 2022-05-05 NOTE — Progress Notes (Signed)
Patient walked in office to have her blood pressure checked. She woke up this morning with pressure in back of head that has gone into throbbing and over all headache. Patient denies any chest pain or shortness of breath.  She has taken her blood pressure medications. Denies any missed doses. She has checked her blood pressure several times today. First bing at 12:30 with reading of 159/79 and HR 54. And several times after with following readings. 155/95 hr 57, 146/83 hr 54 and 161/81 hr 57. Her normal home readings are 137/74 range with HR around 64.   I have checked her readings in office today and documented in chart. Patient advised that readings were normal. She will monitor at home and bring home cuff to next visit to compare readings.

## 2022-05-09 NOTE — Telephone Encounter (Signed)
Left message to return call to our office.  

## 2022-05-10 ENCOUNTER — Other Ambulatory Visit: Payer: Self-pay | Admitting: Primary Care

## 2022-05-10 DIAGNOSIS — G8929 Other chronic pain: Secondary | ICD-10-CM

## 2022-05-11 ENCOUNTER — Other Ambulatory Visit: Payer: Self-pay | Admitting: Primary Care

## 2022-05-11 DIAGNOSIS — F418 Other specified anxiety disorders: Secondary | ICD-10-CM

## 2022-05-22 NOTE — Telephone Encounter (Signed)
Patient called back. I had addressed everything in past notes with patient. She thought she needed to recheck kidney function? I do not see in last lab note where you wanted to have done. Advised patient I would find out and if needed will call to set up lab app.

## 2022-05-22 NOTE — Telephone Encounter (Signed)
Left message to return call to our office.  

## 2022-05-23 NOTE — Telephone Encounter (Signed)
See my chart message conversation from 05/03/2022.  I have ordered repeat renal function panel, she needs a lab only appointment.

## 2022-05-29 ENCOUNTER — Other Ambulatory Visit (INDEPENDENT_AMBULATORY_CARE_PROVIDER_SITE_OTHER): Payer: Managed Care, Other (non HMO)

## 2022-05-29 DIAGNOSIS — N289 Disorder of kidney and ureter, unspecified: Secondary | ICD-10-CM | POA: Diagnosis not present

## 2022-05-29 LAB — BASIC METABOLIC PANEL
BUN: 13 mg/dL (ref 6–23)
CO2: 33 mEq/L — ABNORMAL HIGH (ref 19–32)
Calcium: 9.9 mg/dL (ref 8.4–10.5)
Chloride: 97 mEq/L (ref 96–112)
Creatinine, Ser: 1.08 mg/dL (ref 0.40–1.20)
GFR: 55.26 mL/min — ABNORMAL LOW (ref >60.00)
Glucose, Bld: 104 mg/dL — ABNORMAL HIGH (ref 70–99)
Potassium: 3.8 mEq/L (ref 3.5–5.1)
Sodium: 138 mEq/L (ref 135–145)

## 2022-05-29 NOTE — Telephone Encounter (Signed)
Called patient she has had labs done today.

## 2022-06-06 ENCOUNTER — Other Ambulatory Visit: Payer: Self-pay | Admitting: Primary Care

## 2022-06-06 DIAGNOSIS — M545 Low back pain, unspecified: Secondary | ICD-10-CM

## 2022-06-07 NOTE — Telephone Encounter (Signed)
Please call patient:  I am receiving ongoing refill requests for diclofenac pills for her pain. Is she taking these daily or doe she have these on autofill at the pharmacy? From what I understood during our last visit is that she is using these as needed. Let me know.

## 2022-06-08 ENCOUNTER — Telehealth: Payer: Self-pay

## 2022-06-08 NOTE — Telephone Encounter (Signed)
Response sent in phone note by mistake. She does need refill.

## 2022-06-08 NOTE — Telephone Encounter (Signed)
Patient is returning a call about results.  

## 2022-06-08 NOTE — Telephone Encounter (Signed)
Noted.  Refill(s) sent to pharmacy.  Please also caution her to use this sparingly as recurrent use of diclofenac can cause reduced kidney function. This could be part of the cause for her recently slightly reduced function.

## 2022-06-08 NOTE — Telephone Encounter (Signed)
Patient called back to office. States that she takes as needed but sometimes it can be up to 3 times a day. Patient has been out for 3 days

## 2022-06-08 NOTE — Telephone Encounter (Signed)
Left message to return call to our office.  

## 2022-06-08 NOTE — Telephone Encounter (Signed)
Noted. See my notes under refills.

## 2022-06-09 NOTE — Telephone Encounter (Signed)
Information added to phone open phone note closing this note so no confusion.

## 2022-06-16 ENCOUNTER — Other Ambulatory Visit: Payer: Self-pay | Admitting: Primary Care

## 2022-06-16 DIAGNOSIS — F418 Other specified anxiety disorders: Secondary | ICD-10-CM

## 2022-06-16 DIAGNOSIS — U099 Post covid-19 condition, unspecified: Secondary | ICD-10-CM

## 2022-06-21 ENCOUNTER — Other Ambulatory Visit: Payer: Self-pay | Admitting: Primary Care

## 2022-06-21 DIAGNOSIS — U099 Post covid-19 condition, unspecified: Secondary | ICD-10-CM

## 2022-06-25 ENCOUNTER — Other Ambulatory Visit: Payer: Self-pay | Admitting: Primary Care

## 2022-06-25 DIAGNOSIS — F418 Other specified anxiety disorders: Secondary | ICD-10-CM

## 2022-07-13 ENCOUNTER — Other Ambulatory Visit: Payer: Self-pay | Admitting: Pulmonary Disease

## 2022-07-13 DIAGNOSIS — J4541 Moderate persistent asthma with (acute) exacerbation: Secondary | ICD-10-CM

## 2022-07-20 ENCOUNTER — Encounter (HOSPITAL_COMMUNITY): Payer: Self-pay | Admitting: Radiology

## 2022-07-21 ENCOUNTER — Ambulatory Visit
Admission: RE | Admit: 2022-07-21 | Discharge: 2022-07-21 | Disposition: A | Discharge status: Home / Self Care | Payer: Managed Care, Other (non HMO) | Source: Ambulatory Visit | Attending: Pulmonary Disease | Admitting: Pulmonary Disease

## 2022-07-21 DIAGNOSIS — J4541 Moderate persistent asthma with (acute) exacerbation: Secondary | ICD-10-CM | POA: Insufficient documentation

## 2022-08-02 ENCOUNTER — Other Ambulatory Visit: Payer: Self-pay | Admitting: Pulmonary Disease

## 2022-08-02 DIAGNOSIS — J4541 Moderate persistent asthma with (acute) exacerbation: Secondary | ICD-10-CM

## 2022-08-09 ENCOUNTER — Ambulatory Visit (INDEPENDENT_AMBULATORY_CARE_PROVIDER_SITE_OTHER)
Admission: RE | Admit: 2022-08-09 | Discharge: 2022-08-09 | Disposition: A | Discharge status: Home / Self Care | Payer: Managed Care, Other (non HMO) | Source: Ambulatory Visit | Attending: Family Medicine | Admitting: Family Medicine

## 2022-08-09 ENCOUNTER — Encounter: Payer: Self-pay | Admitting: Family Medicine

## 2022-08-09 ENCOUNTER — Telehealth (INDEPENDENT_AMBULATORY_CARE_PROVIDER_SITE_OTHER): Payer: Managed Care, Other (non HMO) | Admitting: Family Medicine

## 2022-08-09 VITALS — Ht 65.0 in | Wt 170.0 lb

## 2022-08-09 DIAGNOSIS — U071 COVID-19: Secondary | ICD-10-CM

## 2022-08-09 DIAGNOSIS — J4530 Mild persistent asthma, uncomplicated: Secondary | ICD-10-CM

## 2022-08-09 MED ORDER — PREDNISONE 20 MG PO TABS: 20.0000 mg | ORAL_TABLET | Freq: Every day | ORAL | 0 refills | Status: AC

## 2022-08-09 NOTE — Assessment & Plan Note (Signed)
In setting of covid Plan another 5 d of prednisone 20 mg (rev side eff)  Fluids/rest Continue current medicines and watch closely

## 2022-08-09 NOTE — Progress Notes (Signed)
Virtual Visit via Video Note  I connected with Samantha Hall on 08/09/22 at 12:30 PM EDT by a video enabled telemedicine application and verified that I am speaking with the correct person using two identifiers.  Location: Patient: home Provider: office    I discussed the limitations of evaluation and management by telemedicine and the availability of in person appointments. The patient expressed understanding and agreed to proceed.  Parties involved in encounter  Patient: Samantha Hall   Provider:  Loura Pardon MD   History of Present Illness: 61 yo pt of NP Clark presents with covid 19  Tested pos sat-then [facility] (was out of window for anti viral)  Symptoms started 2 weeks ago  ST to start , got better then worse  Today she feels a little nausea (no vomiting) Does not feel bad  Chills/ cold today - has not checked temp  No body aches (since she started the prednisone)  Sneezing some last night  Headache: top of her head   Cough: sometimes a spec of phlegm / little color but not much  Wheezing - none  Congestion -mucinx helps  Otc: Chlorcedin for cough and congestion   Has asthma  Started a round of prednisone after her test (pred 20 mg for 5 days)  Albuterol nebs - every 6 hours  Dupixent shot this am  Budenonide  Antibiotic -cephalexin (prior to test) 'mucinex   Has had 4 covid shots   CXR clear today      Patient Active Problem List   Diagnosis Date Noted   COVID-19 08/09/2022   Genital herpes 05/03/2022   Right lower quadrant abdominal pain 04/07/2022   Epigastric pain 04/07/2022   Dysuria 04/07/2022   Asthma 02/06/2022   OSA (obstructive sleep apnea) 02/06/2022   Frequent headaches 02/06/2022   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   Vaginal atrophy 05/16/2020   Vitamin D deficiency 04/30/2020   Vaginal discharge 12/22/2019   Liver lesion, left lobe 11/17/2019   Kidney lesion, native, right 11/17/2019   Breast pain  11/17/2019   Constipation 11/17/2019   Rash and nonspecific skin eruption 03/31/2019   Facial paresthesia 03/11/2019   Bilateral hand pain 12/27/2018   Chronic shoulder pain 06/10/2018   Elevated LFTs 06/10/2018   Prediabetes 06/10/2018   Knee pain, bilateral 04/29/2018   Situational anxiety 12/05/2017   Chronic low back pain 10/31/2017   Osteoarthritis 02/02/2017   Encounter for annual general medical examination with abnormal findings in adult 02/02/2017   Hyperlipidemia 02/02/2017   Essential hypertension 11/22/2015   Past Medical History:  Diagnosis Date   Elevated antinuclear antibody (ANA) level    Negative workup for Lupus.   Fibroids    Fracture of left patella 04/30/2018   Hyperlipidemia    Hypertension    Controlled   Osteoarthritis    Pain of upper abdomen 12/27/2018   Sciatica    Vitamin D deficiency    Past Surgical History:  Procedure Laterality Date   ABDOMINAL HYSTERECTOMY  2002   CHOLECYSTECTOMY  2015   TUBAL LIGATION  1984   Social History   Tobacco Use   Smoking status: Never   Smokeless tobacco: Never  Vaping Use   Vaping Use: Never used  Substance Use Topics   Alcohol use: Not Currently   Drug use: No   Family History  Problem Relation Age of Onset   Diabetes Mother    Heart disease Mother    Heart disease Father  Allergies  Allergen Reactions   Cefazolin Swelling and Anaphylaxis    Difficulty breathing   Lidocaine Swelling   Latex Rash    Contact rash   Other Rash    Contact rash   Sulfa Antibiotics Rash   Current Outpatient Medications on File Prior to Visit  Medication Sig Dispense Refill   albuterol (PROVENTIL) (2.5 MG/3ML) 0.083% nebulizer solution Take 3 mLs (2.5 mg total) by nebulization every 4 (four) hours as needed for wheezing or shortness of breath. 75 mL 1   albuterol (VENTOLIN HFA) 108 (90 Base) MCG/ACT inhaler INHALE 2 PUFFS INTO THE LUNGS EVERY 6 HOURS AS NEEDED FOR SHORTNESS OF BREATH 8.5 each 0   aspirin 81 MG  EC tablet Take by mouth.     b complex vitamins tablet Take 1 tablet by mouth daily.     budesonide (PULMICORT) 0.5 MG/2ML nebulizer solution Take 2 mLs (0.5 mg total) by nebulization in the morning and at bedtime. 60 mL 0   Cholecalciferol (VITAMIN D) 2000 units CAPS Take by mouth.     diclofenac (VOLTAREN) 75 MG EC tablet TAKE 1 TABLET (75 MG TOTAL) BY MOUTH 2 (TWO) TIMES DAILY AS NEEDED (FOR LEG PAIN). 180 tablet 0   Dupilumab 300 MG/2ML SOPN Inject 300 mg into the skin every 14 (fourteen) days.     estradiol (ESTRACE VAGINAL) 0.1 MG/GM vaginal cream Place 1 Applicatorful vaginally 3 (three) times a week. 42.5 g 3   fexofenadine (ALLEGRA) 180 MG tablet Take 180 mg by mouth daily as needed.     hydrOXYzine (ATARAX) 10 MG tablet TAKE 1 TABLET (10 MG TOTAL) BY MOUTH TWICE A DAY AS NEEDED FOR ANXIETY 60 tablet 0   losartan (COZAAR) 25 MG tablet Take 25 mg by mouth daily.     metoprolol succinate (TOPROL-XL) 25 MG 24 hr tablet Take 25 mg by mouth daily.     montelukast (SINGULAIR) 10 MG tablet Take 10 mg by mouth at bedtime.     Nebulizers (COMPRESSOR/NEBULIZER) MISC 1 Units by Does not apply route every 4 (four) hours as needed. 1 each 0   olopatadine (PATANOL) 0.1 % ophthalmic solution Place 1 drop into both eyes 2 (two) times daily as needed for allergies. 5 mL 1   omeprazole (PRILOSEC) 20 MG capsule Take 1 capsule (20 mg total) by mouth daily. For heartburn 90 capsule 3   potassium chloride (KLOR-CON) 10 MEQ tablet Take 10 mEq by mouth daily.     Spacer/Aero-Holding Dorise Bullion Use as directed with albuterol inhaler 1 Units 0   SUMAtriptan (IMITREX) 25 MG tablet PLEASE SEE ATTACHED FOR DETAILED DIRECTIONS     triamcinolone cream (KENALOG) 0.5 % APPLY TO AFFECTED AREA TWICE A DAY 30 g 0   TURMERIC PO Take 4 capsules by mouth daily.     valACYclovir (VALTREX) 500 MG tablet Take 1 tablet (500 mg total) by mouth daily. 90 tablet 0   No current facility-administered medications on file prior to  visit.   Review of Systems  Constitutional:  Positive for chills. Negative for fever and malaise/fatigue.  HENT:  Positive for congestion. Negative for ear pain, sinus pain and sore throat.   Eyes:  Negative for blurred vision, discharge and redness.  Respiratory:  Positive for cough and sputum production. Negative for shortness of breath, wheezing and stridor.   Cardiovascular:  Negative for chest pain, palpitations and leg swelling.  Gastrointestinal:  Positive for nausea. Negative for abdominal pain, diarrhea and vomiting.  Musculoskeletal:  Negative  for myalgias.  Skin:  Negative for rash.  Neurological:  Positive for headaches. Negative for dizziness.    Observations/Objective: Patient appears well, in no distress Weight is baseline  No facial swelling or asymmetry Normal voice-not hoarse and no slurred speech No obvious tremor or mobility impairment Moving neck and UEs normally Able to hear the call well  No wheeze or shortness of breath during interview , occ clears her throat but not coughing  Talkative and mentally sharp with no cognitive changes No skin changes on face or neck , no rash or pallor Affect is normal    Assessment and Plan: Problem List Items Addressed This Visit       Respiratory   Asthma - Primary    In setting of covid Plan another 5 d of prednisone 20 mg (rev side eff)  Fluids/rest Continue current medicines and watch closely      Relevant Medications   predniSONE (DELTASONE) 20 MG tablet   Other Relevant Orders   DG Chest 2 View     Other   COVID-19    Pos test sat but several weeks of sympt prior  Out of window for anti viral  Took keflex and prednisone  In setting of asthma- continue prednisone 20 mg for 5 more days  cxr ordered  Symptom control rev ER precautions rev Isolation and masking protocol rev       Relevant Orders   DG Chest 2 View     Follow Up Instructions: Continue to treat your symptoms  Come in for a chest  xray after 2 today please  Wear your N95 mask   If symptoms worsen let us know  If you become short of breath go to the ER Take 5 more days of prednisone 20 mg   Update if not starting to improve in a week or if worsening    Isolate until symptoms are better Mask for 10 additional days after that    I discussed the assessment and treatment plan with the patient. The patient was provided an opportunity to ask questions and all were answered. The patient agreed with the plan and demonstrated an understanding of the instructions.   The patient was advised to call back or seek an in-person evaluation if the symptoms worsen or if the condition fails to improve as anticipated.     Loura Pardon, MD

## 2022-08-09 NOTE — Patient Instructions (Signed)
Continue to treat your symptoms  Come in for a chest xray after 2 today please  Wear your N95 mask   If symptoms worsen let us know  If you become short of breath go to the ER Take 5 more days of prednisone 20 mg   Update if not starting to improve in a week or if worsening    Isolate until symptoms are better Mask for 10 additional days after that

## 2022-08-09 NOTE — Assessment & Plan Note (Addendum)
Pos test sat but several weeks of sympt prior  Out of window for anti viral  Took keflex and prednisone  In setting of asthma- continue prednisone 20 mg for 5 more days  cxr ordered  Symptom control rev ER precautions rev Isolation and masking protocol rev  Addendum: cxr clear-reassuring

## 2022-08-17 ENCOUNTER — Ambulatory Visit: Payer: Managed Care, Other (non HMO) | Admitting: Family Medicine

## 2022-08-17 ENCOUNTER — Encounter: Payer: Self-pay | Admitting: Family Medicine

## 2022-08-17 ENCOUNTER — Ambulatory Visit
Admission: RE | Admit: 2022-08-17 | Discharge: 2022-08-17 | Disposition: A | Discharge status: Home / Self Care | Payer: Managed Care, Other (non HMO) | Source: Ambulatory Visit | Attending: Pulmonary Disease | Admitting: Pulmonary Disease

## 2022-08-17 ENCOUNTER — Other Ambulatory Visit: Payer: Managed Care, Other (non HMO)

## 2022-08-17 VITALS — BP 128/70 | HR 62 | Temp 97.3°F | Ht 65.0 in | Wt 170.1 lb

## 2022-08-17 DIAGNOSIS — I10 Essential (primary) hypertension: Secondary | ICD-10-CM

## 2022-08-17 DIAGNOSIS — R7303 Prediabetes: Secondary | ICD-10-CM | POA: Diagnosis not present

## 2022-08-17 DIAGNOSIS — E785 Hyperlipidemia, unspecified: Secondary | ICD-10-CM | POA: Diagnosis not present

## 2022-08-17 DIAGNOSIS — R944 Abnormal results of kidney function studies: Secondary | ICD-10-CM | POA: Insufficient documentation

## 2022-08-17 DIAGNOSIS — Z8349 Family history of other endocrine, nutritional and metabolic diseases: Secondary | ICD-10-CM

## 2022-08-17 DIAGNOSIS — M3500 Sicca syndrome, unspecified: Secondary | ICD-10-CM | POA: Insufficient documentation

## 2022-08-17 DIAGNOSIS — R768 Other specified abnormal immunological findings in serum: Secondary | ICD-10-CM

## 2022-08-17 DIAGNOSIS — J4541 Moderate persistent asthma with (acute) exacerbation: Secondary | ICD-10-CM

## 2022-08-17 MED ORDER — IOPAMIDOL (ISOVUE-300) INJECTION 61%
75.0000 mL | Freq: Once | INTRAVENOUS | Status: AC | PRN
  Administered 2022-08-17: 75 mL via INTRAVENOUS

## 2022-08-17 MED ORDER — LOSARTAN POTASSIUM 25 MG PO TABS: 12.5000 mg | ORAL_TABLET | Freq: Every day | ORAL | 0 refills | Status: DC

## 2022-08-17 NOTE — Assessment & Plan Note (Addendum)
Baseline around 55, per pt was 46 with labs for CT chest today  She cannot give a urine sample and has no urinary symptoms On hctz for bp (HTN) prediabetes H/o high ANA fam h/o amyloidosis  occ nsaid/ diclofenac Reviewed chart today incl last [place] and CT showing small cyst in R kidney She is new to me today   Plan to hold hctz and try 12.5 mg losartan for bp instead Hold all [employer]sa[employer] fluid to 64 oz daily  Re check bmet with ua next week (pt also req a1c and lipids)  Then for pcp f/u for further plan

## 2022-08-17 NOTE — Assessment & Plan Note (Signed)
Per pt no symptoms

## 2022-08-17 NOTE — Assessment & Plan Note (Signed)
BP: 128/70  hctz may be decreasing GFR Plan to try 12.5 mg of losartan daily instead (25 was to much for her)  Monitor bp Cardiology appt on Tuesday Lab here next week

## 2022-08-17 NOTE — Assessment & Plan Note (Signed)
Pt asks to add a1c to next weeks labs States diet is good

## 2022-08-17 NOTE — Assessment & Plan Note (Signed)
Pt asks this be added to next weeks labs Diet is good

## 2022-08-17 NOTE — Patient Instructions (Addendum)
Stop the hctz -it may be lowering your GFR Drink lots of fluids -aim for 64 oz of fluids daily mostly water  Avoid nsaids including diclofenac and ibuprofen and naproxen   Take 1/2 pill of losartan 25 mg  If your blood pressure is too low hold it and don't take anything   See cardiology next week as planned  Let's check kidney labs, a1c, urinalysis and cholesterol next week Please schedule a fasting lab appt (water is fine but don't eat for 4 hours prior)

## 2022-08-17 NOTE — Progress Notes (Signed)
Subjective:    Patient ID: Samantha Hall, female    DOB: 08-14-1961, 61 y.o.   MRN: [id number]  HPI Pt presents with kidney concerns  61 yo pt of NP Clark   Wt Readings from Last 3 Encounters:  08/17/22 170 lb 2 oz (77.2 kg)  08/09/22 170 lb (77.1 kg)  05/03/22 171 lb (77.6 kg)   28.31 kg/m   Here for decreased GFR  Per pt was 46 this am pre CT chest She is very concerned    Baseline here is higher but now on hctz (this was not on med list today) Lab Results  Component Value Date   CREATININE 1.08 05/29/2022   BUN 13 05/29/2022   NA 138 05/29/2022   K 3.8 05/29/2022   CL 97 05/29/2022   CO2 33 (H) 05/29/2022   GFR 55.26 Urine culture negative 03/2022  Voltaren is on med list  2 of them last week / before that was a long time  Also omeprazole- does not take often / very seldom   GFR this am per pt was 46 (for a CT scan)   No urinary symptoms at all  Drank just coffee this am /not water- drinking water now     H/o HTN bp is stable today  No cp or palpitations or headaches or edema  No side effects to medicines  BP Readings from Last 3 Encounters:  08/17/22 128/70  05/05/22 110/64  05/03/22 120/62     Pulse Readings from Last 3 Encounters:  08/17/22 62  05/05/22 (!) 54  05/03/22 62   Losartan 25 mg daily -stopped when bp was too low   Then hctz 25 daily (came from cardiology)    Metoprolol 25 mg daily -no longer on    Follows up with cardiology on Tuesday    Bp has been labile in the past  On/off medicine in the past     Father had amyloidosis of the heart     Last abdominal ultrasound was 01/2022  US Abdomen Complete (Accession [id number]) (Order [id number]) Imaging Date: 02/08/2022 Department: [facility] Released By: Noralyn Pick Authorizing: Pleas Koch, NP   Exam Status  Status  Final [99]   PACS Intelerad Image Link   Show images for US Abdomen Complete  Study Result  Narrative & Impression   CLINICAL DATA:  Left hepatic and right renal lesions.   EXAM: ABDOMEN ULTRASOUND COMPLETE   COMPARISON:  November 01, 2020.   FINDINGS: Gallbladder: Status post cholecystectomy.   Common bile duct: Diameter: 3 mm which is within normal limits.   Liver: Stable 6 mm left hepatic cyst. Within normal limits in parenchymal echogenicity. Portal vein is patent on color Doppler imaging with normal direction of blood flow towards the liver.   IVC: No abnormality visualized.   Pancreas: Visualized portion unremarkable.   Spleen: Size and appearance within normal limits.   Right Kidney: Length: 9.2 cm. Grossly stable 1.1 cm simple cyst seen in upper pole. Echogenicity within normal limits. No mass or hydronephrosis visualized.   Left Kidney: Length: 10.4 cm. Echogenicity within normal limits. No mass or hydronephrosis visualized.   Abdominal aorta: No aneurysm visualized.   Other findings: None.   IMPRESSION: Status post cholecystectomy. Stable left hepatic cyst. Stable right renal cyst. No other abnormality seen in the abdomen.     CTof abd /pelvis in April showed the same     Lab Results  Component Value Date   HGBA1C 6.2 05/03/2022  From pulmonology     (has difficult to control asthma) History of elevated ANA  Lab Results  Component Value Date   ANA POS (A) 01/31/2017   Per pt h/o sjogren's labs  No symptoms    Patient Active Problem List   Diagnosis Date Noted   Decreased GFR 08/17/2022   Family history of amyloidosis 08/17/2022   Positive ANA (antinuclear antibody) 08/17/2022   COVID-19 08/09/2022   Genital herpes 05/03/2022   Right lower quadrant abdominal pain 04/07/2022   Epigastric pain 04/07/2022   Asthma 02/06/2022   OSA (obstructive sleep apnea) 02/06/2022   Frequent headaches 02/06/2022   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   Vaginal atrophy 05/16/2020   Vitamin D deficiency 04/30/2020   Vaginal discharge 12/22/2019    Liver lesion, left lobe 11/17/2019   Kidney lesion, native, right 11/17/2019   Breast pain 11/17/2019   Constipation 11/17/2019   Rash and nonspecific skin eruption 03/31/2019   Facial paresthesia 03/11/2019   Bilateral hand pain 12/27/2018   Chronic shoulder pain 06/10/2018   Elevated LFTs 06/10/2018   Prediabetes 06/10/2018   Knee pain, bilateral 04/29/2018   Situational anxiety 12/05/2017   Chronic low back pain 10/31/2017   Osteoarthritis 02/02/2017   Encounter for annual general medical examination with abnormal findings in adult 02/02/2017   Hyperlipidemia 02/02/2017   Essential hypertension 11/22/2015   Past Medical History:  Diagnosis Date   Elevated antinuclear antibody (ANA) level    Negative workup for Lupus.   Fibroids    Fracture of left patella 04/30/2018   Hyperlipidemia    Hypertension    Controlled   Osteoarthritis    Pain of upper abdomen 12/27/2018   Sciatica    Vitamin D deficiency    Past Surgical History:  Procedure Laterality Date   ABDOMINAL HYSTERECTOMY  2002   CHOLECYSTECTOMY  2015   TUBAL LIGATION  1984   Social History   Tobacco Use   Smoking status: Never   Smokeless tobacco: Never  Vaping Use   Vaping Use: Never used  Substance Use Topics   Alcohol use: Not Currently   Drug use: No   Family History  Problem Relation Age of Onset   Diabetes Mother    Heart disease Mother    Heart disease Father    Allergies  Allergen Reactions   Cefazolin Swelling and Anaphylaxis    Difficulty breathing   Lidocaine Swelling   Latex Rash    Contact rash   Other Rash    Contact rash   Sulfa Antibiotics Rash   Current Outpatient Medications on File Prior to Visit  Medication Sig Dispense Refill   albuterol (PROVENTIL) (2.5 MG/3ML) 0.083% nebulizer solution Take 3 mLs (2.5 mg total) by nebulization every 4 (four) hours as needed for wheezing or shortness of breath. 75 mL 1   albuterol (VENTOLIN HFA) 108 (90 Base) MCG/ACT inhaler INHALE 2  PUFFS INTO THE LUNGS EVERY 6 HOURS AS NEEDED FOR SHORTNESS OF BREATH 8.5 each 0   aspirin 81 MG EC tablet Take by mouth.     b complex vitamins tablet Take 1 tablet by mouth daily.     budesonide (PULMICORT) 0.5 MG/2ML nebulizer solution Take 2 mLs (0.5 mg total) by nebulization in the morning and at bedtime. 60 mL 0   Cholecalciferol (VITAMIN D) 2000 units CAPS Take by mouth.     Dupilumab 300 MG/2ML SOPN Inject 300 mg into the skin every 14 (fourteen) days.     estradiol (ESTRACE  VAGINAL) 0.1 MG/GM vaginal cream Place 1 Applicatorful vaginally 3 (three) times a week. 42.5 g 3   fexofenadine (ALLEGRA) 180 MG tablet Take 180 mg by mouth daily as needed.     hydrOXYzine (ATARAX) 10 MG tablet TAKE 1 TABLET (10 MG TOTAL) BY MOUTH TWICE A DAY AS NEEDED FOR ANXIETY 60 tablet 0   montelukast (SINGULAIR) 10 MG tablet Take 10 mg by mouth at bedtime.     Nebulizers (COMPRESSOR/NEBULIZER) MISC 1 Units by Does not apply route every 4 (four) hours as needed. 1 each 0   olopatadine (PATANOL) 0.1 % ophthalmic solution Place 1 drop into both eyes 2 (two) times daily as needed for allergies. 5 mL 1   omeprazole (PRILOSEC) 20 MG capsule Take 1 capsule (20 mg total) by mouth daily. For heartburn (Patient taking differently: Take 20 mg by mouth daily as needed. For heartburn) 90 capsule 3   potassium chloride (KLOR-CON) 10 MEQ tablet Take 10 mEq by mouth daily.     predniSONE (DELTASONE) 20 MG tablet Take 1 tablet (20 mg total) by mouth daily with breakfast. 7 tablet 0   Spacer/Aero-Holding Chambers DEVI Use as directed with albuterol inhaler 1 Units 0   SUMAtriptan (IMITREX) 25 MG tablet PLEASE SEE ATTACHED FOR DETAILED DIRECTIONS     triamcinolone cream (KENALOG) 0.5 % APPLY TO AFFECTED AREA TWICE A DAY 30 g 0   TURMERIC PO Take 4 capsules by mouth daily.     valACYclovir (VALTREX) 500 MG tablet Take 1 tablet (500 mg total) by mouth daily. 90 tablet 0   No current facility-administered medications on file prior  to visit.     Review of Systems  Constitutional:  Negative for activity change, appetite change, fatigue, fever and unexpected weight change.  HENT:  Negative for congestion, ear pain, rhinorrhea, sinus pressure and sore throat.   Eyes:  Negative for pain, redness and visual disturbance.  Respiratory:  Negative for cough, shortness of breath and wheezing.        Difficult to control asthma   Cardiovascular:  Negative for chest pain and palpitations.  Gastrointestinal:  Negative for abdominal pain, blood in stool, constipation and diarrhea.  Endocrine: Negative for polydipsia and polyuria.  Genitourinary:  Negative for dysuria, frequency and urgency.  Musculoskeletal:  Negative for arthralgias, back pain and myalgias.  Skin:  Negative for pallor and rash.  Allergic/Immunologic: Negative for environmental allergies.  Neurological:  Negative for dizziness, syncope and headaches.  Hematological:  Negative for adenopathy. Does not bruise/bleed easily.  Psychiatric/Behavioral:  Negative for decreased concentration and dysphoric mood. The patient is nervous/anxious.        Objective:   Physical Exam Constitutional:      General: She is not in acute distress.    Appearance: Normal appearance. She is well-developed and normal weight. She is not ill-appearing or diaphoretic.     Comments: Overweight   HENT:     Head: Normocephalic and atraumatic.     Mouth/Throat:     Mouth: Mucous membranes are moist.  Eyes:     General: No scleral icterus.       Right eye: No discharge.        Left eye: No discharge.     Conjunctiva/sclera: Conjunctivae normal.     Pupils: Pupils are equal, round, and reactive to light.  Neck:     Thyroid: No thyromegaly.     Vascular: No carotid bruit or JVD.  Cardiovascular:     Rate and Rhythm: Normal  rate and regular rhythm.     Heart sounds: Normal heart sounds.     No gallop.  Pulmonary:     Effort: Pulmonary effort is normal. No respiratory distress.      Breath sounds: Normal breath sounds. No stridor. No wheezing, rhonchi or rales.  Abdominal:     General: Abdomen is flat. There is no distension or abdominal bruit.     Palpations: Abdomen is soft. There is no hepatomegaly, splenomegaly or pulsatile mass.     Tenderness: There is no abdominal tenderness. There is no right CVA tenderness, left CVA tenderness or guarding.  Musculoskeletal:     Cervical back: Normal range of motion and neck supple.     Right lower leg: No edema.     Left lower leg: No edema.  Lymphadenopathy:     Cervical: No cervical adenopathy.  Skin:    General: Skin is warm and dry.     Coloration: Skin is not jaundiced or pale.     Findings: No bruising or rash.  Neurological:     Mental Status: She is alert.     Coordination: Coordination normal.     Deep Tendon Reflexes: Reflexes are normal and symmetric. Reflexes normal.  Psychiatric:        Mood and Affect: Mood normal.           Assessment & Plan:   Problem List Items Addressed This Visit       Cardiovascular and Mediastinum   Essential hypertension    BP: 128/70  hctz may be decreasing GFR Plan to try 12.5 mg of losartan daily instead (25 was to much for her)  Monitor bp Cardiology appt on Tuesday Lab here next week       Relevant Medications   losartan (COZAAR) 25 MG tablet   Other Relevant Orders   Basic metabolic panel   Hemoglobin A1c   POCT Urinalysis Dipstick (Automated)   POCT UA - Microscopic Only     Other   Decreased GFR - Primary    Baseline around 55, per pt was 46 with labs for CT chest today  She cannot give a urine sample and has no urinary symptoms On hctz for bp (HTN) prediabetes H/o high ANA fam h/o amyloidosis  occ nsaid/ diclofenac Reviewed chart today incl last [place] and CT showing small cyst in R kidney She is new to me today   Plan to hold hctz and try 12.5 mg losartan for bp instead Hold all nsaids Inc fluid to 64 oz daily  Re check bmet with ua next week  (pt also req a1c and lipids)  Then for pcp f/u for further plan       Relevant Orders   Basic metabolic panel   Hemoglobin A1c   POCT Urinalysis Dipstick (Automated)   POCT UA - Microscopic Only   Family history of amyloidosis   Hyperlipidemia    Pt asks this be added to next weeks labs Diet is good       Relevant Medications   losartan (COZAAR) 25 MG tablet   Other Relevant Orders   Lipid panel   Positive ANA (antinuclear antibody)    Per pt no symptoms      Prediabetes    Pt asks to add a1c to next weeks labs States diet is good      Relevant Orders   Hemoglobin A1c

## 2022-08-25 ENCOUNTER — Other Ambulatory Visit (INDEPENDENT_AMBULATORY_CARE_PROVIDER_SITE_OTHER): Payer: Managed Care, Other (non HMO)

## 2022-08-25 DIAGNOSIS — R7303 Prediabetes: Secondary | ICD-10-CM | POA: Diagnosis not present

## 2022-08-25 DIAGNOSIS — R944 Abnormal results of kidney function studies: Secondary | ICD-10-CM | POA: Diagnosis not present

## 2022-08-25 DIAGNOSIS — E785 Hyperlipidemia, unspecified: Secondary | ICD-10-CM | POA: Diagnosis not present

## 2022-08-25 DIAGNOSIS — I10 Essential (primary) hypertension: Secondary | ICD-10-CM | POA: Diagnosis not present

## 2022-08-25 LAB — URINALYSIS, ROUTINE W REFLEX MICROSCOPIC
Bilirubin Urine: NEGATIVE
Hgb urine dipstick: NEGATIVE
Ketones, ur: NEGATIVE
Leukocytes,Ua: NEGATIVE
Nitrite: NEGATIVE
RBC / HPF: NONE SEEN (ref 0–?)
Specific Gravity, Urine: <=1.005 — AB (ref 1.000–1.030)
Total Protein, Urine: NEGATIVE
Urine Glucose: NEGATIVE
Urobilinogen, UA: 0.2 (ref 0.0–1.0)
pH: 7 (ref 5.0–8.0)

## 2022-08-25 LAB — BASIC METABOLIC PANEL
BUN: 12 mg/dL (ref 6–23)
CO2: 27 mEq/L (ref 19–32)
Calcium: 8.7 mg/dL (ref 8.4–10.5)
Chloride: 106 mEq/L (ref 96–112)
Creatinine, Ser: 1 mg/dL (ref 0.40–1.20)
GFR: 60.5 mL/min (ref >60.00)
Glucose, Bld: 94 mg/dL (ref 70–99)
Potassium: 3.9 mEq/L (ref 3.5–5.1)
Sodium: 138 mEq/L (ref 135–145)

## 2022-08-25 LAB — LIPID PANEL
Cholesterol: 225 mg/dL — ABNORMAL HIGH (ref 0–200)
HDL: 79.5 mg/dL (ref >39.00)
LDL Cholesterol: 135 mg/dL — ABNORMAL HIGH (ref 0–99)
NonHDL: 145.59
Total CHOL/HDL Ratio: 3
Triglycerides: 52 mg/dL (ref 0.0–149.0)
VLDL: 10.4 mg/dL (ref 0.0–40.0)

## 2022-08-25 LAB — HEMOGLOBIN A1C: Hgb A1c MFr Bld: 6.4 % (ref 4.6–6.5)

## 2022-08-25 NOTE — Addendum Note (Signed)
Addended by: Ellamae Sia on: 08/25/2022 08:51 AM   Modules accepted: Orders

## 2022-08-28 ENCOUNTER — Telehealth: Payer: Self-pay | Admitting: Primary Care

## 2022-08-28 NOTE — Telephone Encounter (Signed)
Called patient set up office visit tomorrow with [place]. Will call if any changes or questions.

## 2022-08-28 NOTE — Telephone Encounter (Signed)
Pt called in stated she still have a cough with mucus . Was seen in office on 08/17/22 would like a call back to discuss [phone]

## 2022-08-29 ENCOUNTER — Encounter: Payer: Self-pay | Admitting: Family

## 2022-08-29 ENCOUNTER — Ambulatory Visit: Payer: Managed Care, Other (non HMO) | Admitting: Family

## 2022-08-29 VITALS — BP 112/64 | HR 64 | Temp 98.6°F | Resp 16 | Ht 65.0 in | Wt 170.2 lb

## 2022-08-29 DIAGNOSIS — J3089 Other allergic rhinitis: Secondary | ICD-10-CM

## 2022-08-29 DIAGNOSIS — Z20822 Contact with and (suspected) exposure to covid-19: Secondary | ICD-10-CM

## 2022-08-29 DIAGNOSIS — J40 Bronchitis, not specified as acute or chronic: Secondary | ICD-10-CM

## 2022-08-29 DIAGNOSIS — J029 Acute pharyngitis, unspecified: Secondary | ICD-10-CM

## 2022-08-29 LAB — POC COVID19 BINAXNOW: SARS Coronavirus 2 Ag: NEGATIVE

## 2022-08-29 LAB — POCT RAPID STREP A (OFFICE): Rapid Strep A Screen: NEGATIVE

## 2022-08-29 MED ORDER — DOXYCYCLINE HYCLATE 100 MG PO TABS: 100.0000 mg | ORAL_TABLET | Freq: Two times a day (BID) | ORAL | 0 refills | Status: DC

## 2022-08-29 MED ORDER — FLUTICASONE PROPIONATE 50 MCG/ACT NA SUSP: 2.0000 | Freq: Every day | NASAL | 6 refills | Status: DC

## 2022-08-29 MED ORDER — AZITHROMYCIN 250 MG PO TABS: ORAL_TABLET | ORAL | 0 refills | Status: AC

## 2022-08-29 MED ORDER — FEXOFENADINE HCL 180 MG PO TABS: 180.0000 mg | ORAL_TABLET | Freq: Every day | ORAL | 1 refills | Status: AC | PRN

## 2022-08-29 NOTE — Progress Notes (Signed)
Established Patient Office Visit  Subjective:  Patient ID: Samantha Hall, female    DOB: 08-Nov-1961  Age: 61 y.o. MRN: [id number]  CC:  Chief Complaint  Patient presents with   Cough    Sometimes yellow mucus comes up.    HPI Samantha Hall is here today with concerns.   Has started with a productive cough with chest congestion.  She sometimes sees yellow sputum.  No sore throat, no sinus pressure, no nasal congestion. No ear pain.  No fever or chills.   Sx for the last four days.  Has not tested for covid.  Went to [place] three weeks ago.   Past Medical History:  Diagnosis Date   Elevated antinuclear antibody (ANA) level    Negative workup for Lupus.   Fibroids    Fracture of left patella 04/30/2018   Hyperlipidemia    Hypertension    Controlled   Osteoarthritis    Pain of upper abdomen 12/27/2018   Sciatica    Vitamin D deficiency     Past Surgical History:  Procedure Laterality Date   ABDOMINAL HYSTERECTOMY  2002   CHOLECYSTECTOMY  2015   TUBAL LIGATION  1984    Family History  Problem Relation Age of Onset   Diabetes Mother    Heart disease Mother    Heart disease Father     Social History   Socioeconomic History   Marital status: Married    Spouse name: Not on file   Number of children: Not on file   Years of education: Not on file   Highest education level: Not on file  Occupational History   Not on file  Tobacco Use   Smoking status: Never   Smokeless tobacco: Never  Vaping Use   Vaping Use: Never used  Substance and Sexual Activity   Alcohol use: Not Currently   Drug use: No   Sexual activity: Yes    Birth control/protection: Surgical    Comment: Hysterectomy  Other Topics Concern   Not on file  Social History Narrative   Married.   Highest level of education Bachelors.    Works as a Education officer, museum.   Social Determinants of Health   Financial Resource Strain: Not on file  Food Insecurity: Not on file  Transportation  Needs: Not on file  Physical Activity: Not on file  Stress: Not on file  Social Connections: Not on file  Intimate Partner Violence: Not on file    Outpatient Medications Prior to Visit  Medication Sig Dispense Refill   albuterol (PROVENTIL) (2.5 MG/3ML) 0.083% nebulizer solution Take 3 mLs (2.5 mg total) by nebulization every 4 (four) hours as needed for wheezing or shortness of breath. 75 mL 1   albuterol (VENTOLIN HFA) 108 (90 Base) MCG/ACT inhaler INHALE 2 PUFFS INTO THE LUNGS EVERY 6 HOURS AS NEEDED FOR SHORTNESS OF BREATH 8.5 each 0   aspirin 81 MG EC tablet Take by mouth.     b complex vitamins tablet Take 1 tablet by mouth daily.     budesonide (PULMICORT) 0.5 MG/2ML nebulizer solution Take 2 mLs (0.5 mg total) by nebulization in the morning and at bedtime. 60 mL 0   Cholecalciferol (VITAMIN D) 2000 units CAPS Take by mouth.     Dupilumab 300 MG/2ML SOPN Inject 300 mg into the skin every 14 (fourteen) days.     estradiol (ESTRACE VAGINAL) 0.1 MG/GM vaginal cream Place 1 Applicatorful vaginally 3 (three) times a week. 42.5 g 3   hydrOXYzine (  ATARAX) 10 MG tablet TAKE 1 TABLET (10 MG TOTAL) BY MOUTH TWICE A DAY AS NEEDED FOR ANXIETY 60 tablet 0   montelukast (SINGULAIR) 10 MG tablet Take 10 mg by mouth at bedtime.     Nebulizers (COMPRESSOR/NEBULIZER) MISC 1 Units by Does not apply route every 4 (four) hours as needed. 1 each 0   olopatadine (PATANOL) 0.1 % ophthalmic solution Place 1 drop into both eyes 2 (two) times daily as needed for allergies. 5 mL 1   omeprazole (PRILOSEC) 20 MG capsule Take 1 capsule (20 mg total) by mouth daily. For heartburn (Patient taking differently: Take 20 mg by mouth daily as needed. For heartburn) 90 capsule 3   potassium chloride (KLOR-CON) 10 MEQ tablet Take 10 mEq by mouth daily.     predniSONE (DELTASONE) 20 MG tablet Take 1 tablet (20 mg total) by mouth daily with breakfast. 7 tablet 0   Spacer/Aero-Holding Chambers DEVI Use as directed with  albuterol inhaler 1 Units 0   SUMAtriptan (IMITREX) 25 MG tablet PLEASE SEE ATTACHED FOR DETAILED DIRECTIONS     triamcinolone cream (KENALOG) 0.5 % APPLY TO AFFECTED AREA TWICE A DAY 30 g 0   TURMERIC PO Take 4 capsules by mouth daily.     valACYclovir (VALTREX) 500 MG tablet Take 1 tablet (500 mg total) by mouth daily. 90 tablet 0   fexofenadine (ALLEGRA) 180 MG tablet Take 180 mg by mouth daily as needed.     losartan (COZAAR) 25 MG tablet Take 0.5 tablets (12.5 mg total) by mouth daily. 30 tablet 0   No facility-administered medications prior to visit.    Allergies  Allergen Reactions   Cefazolin Swelling and Anaphylaxis    Difficulty breathing   Lidocaine Swelling   Losartan     Lowers heart rate and causes fatigue   Latex Rash    Contact rash   Other Rash    Contact rash   Sulfa Antibiotics Rash        Objective:    Physical Exam Vitals reviewed.  Constitutional:      General: She is not in acute distress.    Appearance: Normal appearance. She is normal weight. She is not ill-appearing, toxic-appearing or diaphoretic.  HENT:     Right Ear: Tympanic membrane normal.     Left Ear: Tympanic membrane normal.     Nose: Mucosal edema and congestion present. No rhinorrhea.     Right Turbinates: Enlarged and swollen.     Left Turbinates: Swollen. Not enlarged.     Right Sinus: No maxillary sinus tenderness or frontal sinus tenderness.     Left Sinus: No maxillary sinus tenderness or frontal sinus tenderness.     Mouth/Throat:     Mouth: Mucous membranes are moist.     Pharynx: Posterior oropharyngeal erythema present. No pharyngeal swelling or oropharyngeal exudate.     Tonsils: No tonsillar exudate.  Eyes:     Extraocular Movements: Extraocular movements intact.     Conjunctiva/sclera: Conjunctivae normal.     Pupils: Pupils are equal, round, and reactive to light.  Neck:     Thyroid: No thyroid mass.  Cardiovascular:     Rate and Rhythm: Normal rate and regular  rhythm.  Pulmonary:     Effort: Pulmonary effort is normal.     Breath sounds: Normal breath sounds.  Lymphadenopathy:     Cervical:     Right cervical: No superficial cervical adenopathy.    Left cervical: No superficial cervical adenopathy.  Neurological:  Mental Status: She is alert.     BP 112/64   Pulse 64   Temp 98.6 F (37 C)   Resp 16   Ht '5\' 5"'$  (1.651 m)   Wt 170 lb 4 oz (77.2 kg)   SpO2 98%   BMI 28.33 kg/m  Wt Readings from Last 3 Encounters:  08/29/22 170 lb 4 oz (77.2 kg)  08/17/22 170 lb 2 oz (77.2 kg)  08/09/22 170 lb (77.1 kg)     Health Maintenance Due  Topic Date Due   INFLUENZA VACCINE  Never done    There are no preventive care reminders to display for this patient.  Lab Results  Component Value Date   TSH 1.409 08/16/2017   Lab Results  Component Value Date   WBC 4.3 04/07/2022   HGB 13.9 04/07/2022   HCT 42.0 04/07/2022   MCV 91.7 04/07/2022   PLT 245.0 04/07/2022   Lab Results  Component Value Date   NA 138 08/25/2022   K 3.9 08/25/2022   CO2 27 08/25/2022   GLUCOSE 94 08/25/2022   BUN 12 08/25/2022   CREATININE 1.00 08/25/2022   BILITOT 0.7 04/07/2022   ALKPHOS 71 04/07/2022   AST 23 04/07/2022   ALT 30 04/07/2022   PROT 7.4 04/07/2022   ALBUMIN 4.6 04/07/2022   CALCIUM 8.7 08/25/2022   ANIONGAP 8 06/17/2021   GFR 60.50 08/25/2022   Lab Results  Component Value Date   HGBA1C 6.4 08/25/2022      Assessment & Plan:   Problem List Items Addressed This Visit       Respiratory   Non-seasonal allergic rhinitis    Recommend starting daily flonase and allegra.       Relevant Medications   fluticasone (FLONASE) 50 MCG/ACT nasal spray   fexofenadine (ALLEGRA) 180 MG tablet   Bronchitis    Sending in azithromycin 250 mg Take antibiotic as prescribed. Increase oral fluids. Pt to f/u if sx worsen and or fail to improve in 2-3 days.         Relevant Medications   fluticasone (FLONASE) 50 MCG/ACT nasal spray    azithromycin (ZITHROMAX) 250 MG tablet     Other   Sore throat    Strep tested in office, negative  Warm salt water gargles        Relevant Medications   fluticasone (FLONASE) 50 MCG/ACT nasal spray   Other Relevant Orders   POCT rapid strep A   POC COVID-19 BinaxNow   Suspected COVID-19 virus infection - Primary    tested for covid in office.       Relevant Medications   fluticasone (FLONASE) 50 MCG/ACT nasal spray   Other Relevant Orders   POCT rapid strep A   POC COVID-19 BinaxNow    Meds ordered this encounter  Medications   DISCONTD: doxycycline (VIBRA-TABS) 100 MG tablet    Sig: Take 1 tablet (100 mg total) by mouth 2 (two) times daily for 7 days.    Dispense:  14 tablet    Refill:  0    Order Specific Question:   Supervising Provider    Answer:   BEDSOLE, AMY E [[id number]]   fluticasone (FLONASE) 50 MCG/ACT nasal spray    Sig: Place 2 sprays into both nostrils daily.    Dispense:  16 g    Refill:  6    Order Specific Question:   Supervising Provider    Answer:   BEDSOLE, AMY E [[id number]]   fexofenadine (ALLEGRA) 180  MG tablet    Sig: Take 1 tablet (180 mg total) by mouth daily as needed.    Dispense:  90 tablet    Refill:  1    Order Specific Question:   Supervising Provider    Answer:   BEDSOLE, AMY E [[id number]]   azithromycin (ZITHROMAX) 250 MG tablet    Sig: Take 2 tablets on day 1, then 1 tablet daily on days 2 through 5    Dispense:  6 tablet    Refill:  0    Order Specific Question:   Supervising Provider    Answer:   BEDSOLE, AMY E [[id number]]    Follow-up: Return if symptoms worsen or fail to improve with pcp.    Eugenia Pancoast, FNP

## 2022-08-29 NOTE — Assessment & Plan Note (Addendum)
tested for covid in office.

## 2022-08-29 NOTE — Assessment & Plan Note (Signed)
Recommend starting daily flonase and allegra.

## 2022-08-29 NOTE — Assessment & Plan Note (Addendum)
Strep tested in office, negative Warm salt water gargles   

## 2022-08-29 NOTE — Assessment & Plan Note (Signed)
Sending in azithromycin 250 mg Take antibiotic as prescribed. Increase oral fluids. Pt to f/u if sx worsen and or fail to improve in 2-3 days.

## 2022-08-29 NOTE — Patient Instructions (Signed)
Antibiotic sent to preferred pharmacy.  Start daily flonase as well as Human resources officer daily.   Please increase oral fluids, steamy hot shower/humidifier prn.  Please follow up if no improvement in 2-3 days.   It was a pleasure seeing you today! Please do not hesitate to reach out with any questions and or concerns.  Regards,   Eugenia Pancoast

## 2022-09-03 ENCOUNTER — Other Ambulatory Visit: Payer: Self-pay | Admitting: Primary Care

## 2022-09-03 DIAGNOSIS — G8929 Other chronic pain: Secondary | ICD-10-CM

## 2022-10-19 ENCOUNTER — Other Ambulatory Visit: Payer: Self-pay | Admitting: Primary Care

## 2022-10-19 DIAGNOSIS — A6000 Herpesviral infection of urogenital system, unspecified: Secondary | ICD-10-CM

## 2022-10-19 NOTE — Telephone Encounter (Signed)
Patient states her symptoms for this one started last week. She said she did not request the medication to be filled, but that she did fine and had no outbreaks while taking it. If you feel appropriate she would like it refilled to start taking again.

## 2022-10-19 NOTE — Telephone Encounter (Signed)
I do not recommend she resume daily unless she is experiencing recurrent outbreaks.  I will refill the prescription for her to use as needed for outbreaks.  This prescription will read take 1 tablet twice daily for 3 days as needed for outbreak.  Let me know once you have spoken with her.

## 2022-10-19 NOTE — Telephone Encounter (Signed)
Please call patient:  Received refill request for Valtrex for herpes outbreaks.  We placed her on a continuous regimen of Valtrex for 3 months starting in May. Has she had outbreaks since discontinuation of Valtrex? How many? When did this one start?

## 2022-10-20 NOTE — Telephone Encounter (Signed)
Spoke with patient, advised you do not recommended daily use. Only use when experiencing an outbreak, 1 tab twice daily for 3 days. Patient understands and will use as directed.

## 2022-10-31 ENCOUNTER — Telehealth: Payer: Self-pay | Admitting: Primary Care

## 2022-10-31 NOTE — Telephone Encounter (Signed)
Pt called stating she missed a call from [facility]. I couldn't find anything in pt's chart. Pt requested a call back @ [phone]

## 2022-11-01 NOTE — Telephone Encounter (Signed)
Claiborne Billings, Tabitha's CMA informed me that she was the one that tried calling the patient regarding a refill request for Flonase received [facility]. She needed to verify that the patient was in need of prescription.  Called patient and she stated she is switching her primary pharmacy over to [facility]. She does not currently need a refill on this medication, the just sent the request to make [place] aware to send it in to [facility] when patient is due.

## 2022-11-01 NOTE — Telephone Encounter (Signed)
Unable to reach patient. Left voicemail to return call to our office.    I see nothing in her chart that I tried to reach her regarding. She can ignore the message.

## 2022-11-16 ENCOUNTER — Other Ambulatory Visit: Payer: Self-pay

## 2022-11-16 DIAGNOSIS — J40 Bronchitis, not specified as acute or chronic: Secondary | ICD-10-CM

## 2022-11-16 DIAGNOSIS — J3089 Other allergic rhinitis: Secondary | ICD-10-CM

## 2022-11-16 DIAGNOSIS — J029 Acute pharyngitis, unspecified: Secondary | ICD-10-CM

## 2022-11-16 DIAGNOSIS — Z20822 Contact with and (suspected) exposure to covid-19: Secondary | ICD-10-CM

## 2022-11-16 NOTE — Telephone Encounter (Signed)
Pt does have refills but she needs them to go to [facility].

## 2022-11-17 MED ORDER — FLUTICASONE PROPIONATE 50 MCG/ACT NA SUSP: 2.0000 | Freq: Every day | NASAL | 6 refills | Status: DC

## 2023-02-02 LAB — HM MAMMOGRAPHY

## 2023-02-12 ENCOUNTER — Other Ambulatory Visit: Payer: Self-pay | Admitting: Primary Care

## 2023-02-12 DIAGNOSIS — J3089 Other allergic rhinitis: Secondary | ICD-10-CM

## 2023-02-28 ENCOUNTER — Other Ambulatory Visit: Payer: Self-pay | Admitting: Orthopedic Surgery

## 2023-02-28 DIAGNOSIS — M75101 Unspecified rotator cuff tear or rupture of right shoulder, not specified as traumatic: Secondary | ICD-10-CM

## 2023-03-06 ENCOUNTER — Encounter: Payer: Self-pay | Admitting: Orthopedic Surgery

## 2023-03-06 DIAGNOSIS — R7989 Other specified abnormal findings of blood chemistry: Secondary | ICD-10-CM

## 2023-03-06 DIAGNOSIS — R7303 Prediabetes: Secondary | ICD-10-CM

## 2023-03-06 DIAGNOSIS — I10 Essential (primary) hypertension: Secondary | ICD-10-CM

## 2023-03-06 DIAGNOSIS — E559 Vitamin D deficiency, unspecified: Secondary | ICD-10-CM

## 2023-03-06 DIAGNOSIS — E785 Hyperlipidemia, unspecified: Secondary | ICD-10-CM

## 2023-03-09 ENCOUNTER — Other Ambulatory Visit (INDEPENDENT_AMBULATORY_CARE_PROVIDER_SITE_OTHER): Payer: Managed Care, Other (non HMO)

## 2023-03-09 DIAGNOSIS — I10 Essential (primary) hypertension: Secondary | ICD-10-CM

## 2023-03-09 DIAGNOSIS — E785 Hyperlipidemia, unspecified: Secondary | ICD-10-CM | POA: Diagnosis not present

## 2023-03-09 DIAGNOSIS — R7303 Prediabetes: Secondary | ICD-10-CM | POA: Diagnosis not present

## 2023-03-09 DIAGNOSIS — E559 Vitamin D deficiency, unspecified: Secondary | ICD-10-CM | POA: Diagnosis not present

## 2023-03-09 LAB — CBC WITH DIFFERENTIAL/PLATELET
Basophils Absolute: 0 10*3/uL (ref 0.0–0.1)
Basophils Relative: 0.6 % (ref 0.0–3.0)
Eosinophils Absolute: 0.1 10*3/uL (ref 0.0–0.7)
Eosinophils Relative: 0.8 % (ref 0.0–5.0)
HCT: 41.2 % (ref 36.0–46.0)
Hemoglobin: 13.7 g/dL (ref 12.0–15.0)
Lymphocytes Relative: 36.6 % (ref 12.0–46.0)
Lymphs Abs: 2.5 10*3/uL (ref 0.7–4.0)
MCHC: 33.4 g/dL (ref 30.0–36.0)
MCV: 92.4 fl (ref 78.0–100.0)
Monocytes Absolute: 0.4 10*3/uL (ref 0.1–1.0)
Monocytes Relative: 5.8 % (ref 3.0–12.0)
Neutro Abs: 3.8 10*3/uL (ref 1.4–7.7)
Neutrophils Relative %: 56.2 % (ref 43.0–77.0)
Platelets: 307 10*3/uL (ref 150.0–400.0)
RBC: 4.45 Mil/uL (ref 3.87–5.11)
RDW: 14.3 % (ref 11.5–15.5)
WBC: 6.8 10*3/uL (ref 4.0–10.5)

## 2023-03-09 LAB — COMPREHENSIVE METABOLIC PANEL
ALT: 24 U/L (ref 0–35)
AST: 16 U/L (ref 0–37)
Albumin: 4.3 g/dL (ref 3.5–5.2)
Alkaline Phosphatase: 86 U/L (ref 39–117)
BUN: 18 mg/dL (ref 6–23)
CO2: 31 mEq/L (ref 19–32)
Calcium: 9.5 mg/dL (ref 8.4–10.5)
Chloride: 104 mEq/L (ref 96–112)
Creatinine, Ser: 0.98 mg/dL (ref 0.40–1.20)
GFR: 61.75 mL/min (ref >60.00)
Glucose, Bld: 89 mg/dL (ref 70–99)
Potassium: 4 mEq/L (ref 3.5–5.1)
Sodium: 142 mEq/L (ref 135–145)
Total Bilirubin: 0.5 mg/dL (ref 0.2–1.2)
Total Protein: 7 g/dL (ref 6.0–8.3)

## 2023-03-09 LAB — LIPID PANEL
Cholesterol: 265 mg/dL — ABNORMAL HIGH (ref 0–200)
HDL: 107.7 mg/dL (ref >39.00)
LDL Cholesterol: 147 mg/dL — ABNORMAL HIGH (ref 0–99)
NonHDL: 157.05
Total CHOL/HDL Ratio: 2
Triglycerides: 52 mg/dL (ref 0.0–149.0)
VLDL: 10.4 mg/dL (ref 0.0–40.0)

## 2023-03-09 LAB — VITAMIN D 25 HYDROXY (VIT D DEFICIENCY, FRACTURES): VITD: 35.14 ng/mL (ref 30.00–100.00)

## 2023-03-09 LAB — VITAMIN B12: Vitamin B-12: 305 pg/mL (ref 211–911)

## 2023-03-09 LAB — TSH: TSH: 1.46 u[IU]/mL (ref 0.35–5.50)

## 2023-03-09 LAB — HEMOGLOBIN A1C: Hgb A1c MFr Bld: 6 % (ref 4.6–6.5)

## 2023-03-14 ENCOUNTER — Encounter: Payer: Self-pay | Admitting: Primary Care

## 2023-03-14 ENCOUNTER — Ambulatory Visit: Payer: Managed Care, Other (non HMO) | Admitting: Primary Care

## 2023-03-14 VITALS — BP 162/86 | HR 70 | Temp 97.5°F | Ht 65.0 in | Wt 169.0 lb

## 2023-03-14 DIAGNOSIS — M3500 Sicca syndrome, unspecified: Secondary | ICD-10-CM

## 2023-03-14 DIAGNOSIS — R7303 Prediabetes: Secondary | ICD-10-CM

## 2023-03-14 DIAGNOSIS — F418 Other specified anxiety disorders: Secondary | ICD-10-CM

## 2023-03-14 DIAGNOSIS — J3089 Other allergic rhinitis: Secondary | ICD-10-CM

## 2023-03-14 DIAGNOSIS — E559 Vitamin D deficiency, unspecified: Secondary | ICD-10-CM

## 2023-03-14 DIAGNOSIS — A6000 Herpesviral infection of urogenital system, unspecified: Secondary | ICD-10-CM | POA: Diagnosis not present

## 2023-03-14 DIAGNOSIS — J4530 Mild persistent asthma, uncomplicated: Secondary | ICD-10-CM

## 2023-03-14 DIAGNOSIS — Z0000 Encounter for general adult medical examination without abnormal findings: Secondary | ICD-10-CM | POA: Diagnosis not present

## 2023-03-14 DIAGNOSIS — N952 Postmenopausal atrophic vaginitis: Secondary | ICD-10-CM

## 2023-03-14 DIAGNOSIS — R519 Headache, unspecified: Secondary | ICD-10-CM

## 2023-03-14 DIAGNOSIS — M1991 Primary osteoarthritis, unspecified site: Secondary | ICD-10-CM

## 2023-03-14 DIAGNOSIS — E785 Hyperlipidemia, unspecified: Secondary | ICD-10-CM

## 2023-03-14 DIAGNOSIS — I10 Essential (primary) hypertension: Secondary | ICD-10-CM

## 2023-03-14 DIAGNOSIS — G4733 Obstructive sleep apnea (adult) (pediatric): Secondary | ICD-10-CM

## 2023-03-14 NOTE — Assessment & Plan Note (Signed)
Improved on recent labs. Continue to work on diet and exercise.

## 2023-03-14 NOTE — Patient Instructions (Signed)
It was a pleasure to see you today!   

## 2023-03-14 NOTE — Assessment & Plan Note (Signed)
Controlled.  Continue Valtrex PRN.

## 2023-03-14 NOTE — Assessment & Plan Note (Signed)
Overall controlled.  Continue sumatriptan 25 mg PRN. Following with neurology.

## 2023-03-14 NOTE — Assessment & Plan Note (Signed)
Stable. HDL is excellent with recent level of 107. Total chol/HDL ratio is 2.  Discussed results with patient today.  Will defer treatment at this time. Follows with cardiology.  Consider CT coronary scan, she will think about this.

## 2023-03-14 NOTE — Assessment & Plan Note (Signed)
Overall controlled.  Continue hydroxyzine 10 mg PRN. Uses sparingly.

## 2023-03-14 NOTE — Assessment & Plan Note (Signed)
Overall controlled.  Continue diclofenac 75 mg BID PRN>

## 2023-03-14 NOTE — Assessment & Plan Note (Signed)
No longer using CPAP machine due to return in coughing.

## 2023-03-14 NOTE — Assessment & Plan Note (Signed)
Stable. ? ?Continue Estrace cream PRN. ?

## 2023-03-14 NOTE — Assessment & Plan Note (Signed)
Following with rheumatology and ophthalmology. Renewed office notes from December 2023 through [facility].

## 2023-03-14 NOTE — Assessment & Plan Note (Signed)
Reviewed recent vitamin D level. Encouraged to take vitamin D more consistently.

## 2023-03-14 NOTE — Progress Notes (Signed)
Subjective:    Patient ID: Samantha Hall, female    DOB: Apr 12, 1961, 62 y.o.   MRN: [id number]  HPI  Samantha Hall is a very pleasant 62 y.o. female with a history of hypertension, OSA, asthma, osteoarthritis, kidney lesion, situational anxiety, chronic shoulder pain, genital herpes, liver lesion who presents today for complete physical and follow up of chronic conditions.   Immunizations: -Tetanus: Completed in 2021 -Influenza: Completed this season -Shingles: Completed Shingrix series  Diet: Fair diet.  Exercise: No regular exercise.  Eye exam: Completes annually  Dental exam: Completes semi-annually    Pap Smear: Hysterectomy  Mammogram: February 2024  Colonoscopy: Completed in 2021, due 2031  BP Readings from Last 3 Encounters:  03/14/23 (!) 162/86  08/29/22 112/64  08/17/22 128/70       Review of Systems  Constitutional:  Negative for unexpected weight change.  HENT:  Negative for rhinorrhea.   Respiratory:  Negative for cough and shortness of breath.   Cardiovascular:  Negative for chest pain.  Gastrointestinal:  Negative for constipation and diarrhea.  Genitourinary:  Negative for difficulty urinating and menstrual problem.  Musculoskeletal:  Positive for arthralgias.  Skin:  Negative for rash.  Allergic/Immunologic: Positive for environmental allergies.  Neurological:  Negative for dizziness and headaches.  Psychiatric/Behavioral:  The patient is not nervous/anxious.          Past Medical History:  Diagnosis Date   Elevated antinuclear antibody (ANA) level    Negative workup for Lupus.   Fibroids    Fracture of left patella 04/30/2018   Hyperlipidemia    Hypertension    Controlled   Osteoarthritis    Pain of upper abdomen 12/27/2018   Sciatica    Vitamin D deficiency     Social History   Socioeconomic History   Marital status: Married    Spouse name: Not on file   Number of children: Not on file   Years of education: Not on file    Highest education level: Not on file  Occupational History   Not on file  Tobacco Use   Smoking status: Never   Smokeless tobacco: Never  Vaping Use   Vaping Use: Never used  Substance and Sexual Activity   Alcohol use: Not Currently   Drug use: No   Sexual activity: Yes    Birth control/protection: Surgical    Comment: Hysterectomy  Other Topics Concern   Not on file  Social History Narrative   Married.   Highest level of education Bachelors.    Works as a Education officer, museum.   Social Determinants of Health   Financial Resource Strain: Not on file  Food Insecurity: Not on file  Transportation Needs: Not on file  Physical Activity: Not on file  Stress: Not on file  Social Connections: Not on file  Intimate Partner Violence: Not on file    Past Surgical History:  Procedure Laterality Date   ABDOMINAL HYSTERECTOMY  2002   CHOLECYSTECTOMY  2015   TUBAL LIGATION  1984    Family History  Problem Relation Age of Onset   Diabetes Mother    Heart disease Mother    Heart disease Father     Allergies  Allergen Reactions   Cefazolin Swelling and Anaphylaxis    Difficulty breathing   Lidocaine Swelling   Losartan     Lowers heart rate and causes fatigue   Latex Rash    Contact rash   Other Rash    Contact rash   Sulfa Antibiotics  Rash    Current Outpatient Medications on File Prior to Visit  Medication Sig Dispense Refill   albuterol (PROVENTIL) (2.5 MG/3ML) 0.083% nebulizer solution Take 3 mLs (2.5 mg total) by nebulization every 4 (four) hours as needed for wheezing or shortness of breath. 75 mL 1   albuterol (VENTOLIN HFA) 108 (90 Base) MCG/ACT inhaler INHALE 2 PUFFS INTO THE LUNGS EVERY 6 HOURS AS NEEDED FOR SHORTNESS OF BREATH 8.5 each 0   amLODipine (NORVASC) 2.5 MG tablet Take 2.5 mg by mouth daily.     aspirin 81 MG EC tablet Take by mouth.     b complex vitamins tablet Take 1 tablet by mouth daily.     budesonide (PULMICORT) 0.5 MG/2ML nebulizer solution  Take 2 mLs (0.5 mg total) by nebulization in the morning and at bedtime. 60 mL 0   Cholecalciferol (VITAMIN D) 2000 units CAPS Take by mouth.     diclofenac (VOLTAREN) 75 MG EC tablet Take 75 mg by mouth 2 (two) times daily as needed.     estradiol (ESTRACE VAGINAL) 0.1 MG/GM vaginal cream Place 1 Applicatorful vaginally 3 (three) times a week. 42.5 g 3   fexofenadine (ALLEGRA) 180 MG tablet Take 1 tablet (180 mg total) by mouth daily as needed. 90 tablet 1   fluticasone (FLONASE) 50 MCG/ACT nasal spray Place 2 sprays into both nostrils daily. 16 g 6   Fluticasone-Umeclidin-Vilant (TRELEGY ELLIPTA) 100-62.5-25 MCG/ACT AEPB Inhale into the lungs.     hydrOXYzine (ATARAX) 10 MG tablet TAKE 1 TABLET (10 MG TOTAL) BY MOUTH TWICE A DAY AS NEEDED FOR ANXIETY 60 tablet 0   montelukast (SINGULAIR) 10 MG tablet Take 10 mg by mouth at bedtime.     Nebulizers (COMPRESSOR/NEBULIZER) MISC 1 Units by Does not apply route every 4 (four) hours as needed. 1 each 0   olopatadine (PATANOL) 0.1 % ophthalmic solution PLACE 1 DROP INTO BOTH EYES 2 TIMES DAILY AS NEEDED FOR ALLERGIES. 5 mL 0   omeprazole (PRILOSEC) 20 MG capsule Take 1 capsule (20 mg total) by mouth daily. For heartburn (Patient taking differently: Take 20 mg by mouth daily as needed. For heartburn) 90 capsule 3   potassium chloride (KLOR-CON) 10 MEQ tablet Take 10 mEq by mouth daily.     predniSONE (DELTASONE) 20 MG tablet Take 1 tablet (20 mg total) by mouth daily with breakfast. 7 tablet 0   Spacer/Aero-Holding Chambers DEVI Use as directed with albuterol inhaler 1 Units 0   triamcinolone cream (KENALOG) 0.5 % APPLY TO AFFECTED AREA TWICE A DAY 30 g 0   TURMERIC PO Take 4 capsules by mouth daily.     valACYclovir (VALTREX) 500 MG tablet Take 1 tablet by mouth twice daily x3 days as needed for each outbreak. 18 tablet 0   Dupilumab 300 MG/2ML SOPN Inject 300 mg into the skin every 14 (fourteen) days. (Patient not taking: Reported on 03/14/2023)      SUMAtriptan (IMITREX) 25 MG tablet PLEASE SEE ATTACHED FOR DETAILED DIRECTIONS (Patient not taking: Reported on 03/14/2023)     No current facility-administered medications on file prior to visit.    BP (!) 162/86   Pulse 70   Temp (!) 97.5 F (36.4 C) (Temporal)   Ht 5\' 5"  (1.651 m)   Wt 169 lb (76.7 kg)   SpO2 100%   BMI 28.12 kg/m  Objective:   Physical Exam HENT:     Right Ear: Tympanic membrane and ear canal normal.     Left Ear:  Tympanic membrane and ear canal normal.     Nose: Nose normal.  Eyes:     Conjunctiva/sclera: Conjunctivae normal.     Pupils: Pupils are equal, round, and reactive to light.  Neck:     Thyroid: No thyromegaly.  Cardiovascular:     Rate and Rhythm: Normal rate and regular rhythm.     Heart sounds: No murmur heard. Pulmonary:     Effort: Pulmonary effort is normal.     Breath sounds: Normal breath sounds. No rales.  Chest:  Breasts:    Right: No mass, skin change or tenderness.     Left: Tenderness present. No mass or skin change.  Abdominal:     General: Bowel sounds are normal.     Palpations: Abdomen is soft.     Tenderness: There is no abdominal tenderness.  Musculoskeletal:        General: Normal range of motion.     Cervical back: Neck supple.  Lymphadenopathy:     Cervical: No cervical adenopathy.  Skin:    General: Skin is warm and dry.     Findings: No rash.  Neurological:     Mental Status: She is alert and oriented to person, place, and time.     Cranial Nerves: No cranial nerve deficit.     Deep Tendon Reflexes: Reflexes are normal and symmetric.  Psychiatric:        Mood and Affect: Mood normal.           Assessment & Plan:  Preventative health care Assessment & Plan: Immunizations UTD.  Mammogram UTD Colonoscopy UTD, due 2031  Discussed the importance of a healthy diet and regular exercise in order for weight loss, and to reduce the risk of further co-morbidity.  Exam stable. Labs reviewed.  Follow up  in 1 year for repeat physical.    Mild persistent asthma without complication Assessment & Plan: Following with pulmonology, reviewed office notes from January 2024 through [facility].  Continue Trelegy 100-62.5-25 mcg/mg daily, Pulmicort 0.5 mg/2 ml nebulized treatments BID, Singulair 10 mg HS, and albuterol inhaler PRN.     OSA (obstructive sleep apnea) Assessment & Plan: No longer using CPAP machine due to return in coughing.   Genital herpes simplex, unspecified site Assessment & Plan: Controlled.  Continue Valtrex PRN.   Vaginal atrophy Assessment & Plan: Stable.  Continue Estrace cream PRN.   Situational anxiety Assessment & Plan: Overall controlled.  Continue hydroxyzine 10 mg PRN. Uses sparingly.   Frequent headaches Assessment & Plan: Overall controlled.  Continue sumatriptan 25 mg PRN. Following with neurology.   Sjogren's syndrome, with unspecified organ involvement [facility]) Assessment & Plan: Following with rheumatology and ophthalmology. Renewed office notes from December 2023 through [place] [facility].   Prediabetes Assessment & Plan: Improved on recent labs. Continue to work on diet and exercise.    Hyperlipidemia, unspecified hyperlipidemia type Assessment & Plan: Stable. HDL is excellent with recent level of 107. Total chol/HDL ratio is 2.  Discussed results with patient today.  Will defer treatment at this time. Follows with cardiology.  Consider CT coronary scan, she will think about this.    Vitamin D deficiency Assessment & Plan: Reviewed recent vitamin D level. Encouraged to take vitamin D more consistently.    Non-seasonal allergic rhinitis, unspecified trigger Assessment & Plan: Waxes and wanes.  Continue Allegra 180 mg daily and Singulair 10 mg HS.   Essential hypertension Assessment & Plan: Above goal, also on recheck. BP at goal during other office  visits.  She will start monitoring at home. Continue  amlodipine 2.5 mg daily.   Primary osteoarthritis, unspecified site Assessment & Plan: Overall controlled.  Continue diclofenac 75 mg BID PRN>         Pleas Koch, NP

## 2023-03-14 NOTE — Assessment & Plan Note (Signed)
Immunizations UTD.  Mammogram UTD Colonoscopy UTD, due 2031  Discussed the importance of a healthy diet and regular exercise in order for weight loss, and to reduce the risk of further co-morbidity.  Exam stable. Labs reviewed.  Follow up in 1 year for repeat physical.

## 2023-03-14 NOTE — Assessment & Plan Note (Signed)
Waxes and wanes.  Continue Allegra 180 mg daily and Singulair 10 mg HS.

## 2023-03-14 NOTE — Assessment & Plan Note (Signed)
Above goal, also on recheck. BP at goal during other office visits.  She will start monitoring at home. Continue amlodipine 2.5 mg daily.

## 2023-03-14 NOTE — Assessment & Plan Note (Addendum)
Following with pulmonology, reviewed office notes from January 2024 through [place].  Continue Trelegy 100-62.5-25 mcg/mg daily, Pulmicort 0.5 mg/2 ml nebulized treatments BID, Singulair 10 mg HS, and albuterol inhaler PRN.

## 2023-03-16 ENCOUNTER — Ambulatory Visit
Admission: RE | Admit: 2023-03-16 | Discharge: 2023-03-16 | Disposition: A | Discharge status: Home / Self Care | Payer: Managed Care, Other (non HMO) | Source: Ambulatory Visit | Attending: Orthopedic Surgery

## 2023-03-16 ENCOUNTER — Ambulatory Visit
Admission: RE | Admit: 2023-03-16 | Discharge: 2023-03-16 | Disposition: A | Discharge status: Home / Self Care | Payer: Managed Care, Other (non HMO) | Source: Ambulatory Visit | Attending: Orthopedic Surgery | Admitting: Orthopedic Surgery

## 2023-03-16 ENCOUNTER — Other Ambulatory Visit: Payer: Self-pay | Admitting: Primary Care

## 2023-03-16 DIAGNOSIS — M75101 Unspecified rotator cuff tear or rupture of right shoulder, not specified as traumatic: Secondary | ICD-10-CM

## 2023-03-16 DIAGNOSIS — R1013 Epigastric pain: Secondary | ICD-10-CM

## 2023-03-21 NOTE — Telephone Encounter (Signed)
Received Pre-op clearance form for Lt Shoulder Scope rotator cuff repair.  Patient had CPE on 03/14/23, does patient need another appt for preop clearance?

## 2023-03-21 NOTE — Telephone Encounter (Signed)
I need to review the form before I can make that determination. Please bring to my office.

## 2023-03-22 NOTE — Telephone Encounter (Signed)
Completed form and placed in Kelli's inbox.

## 2023-03-22 NOTE — Telephone Encounter (Signed)
Form is placed in your inbox.

## 2023-03-22 NOTE — Telephone Encounter (Signed)
Faxed completed Pre-op form. Received fax confirmation.

## 2023-03-30 ENCOUNTER — Ambulatory Visit
Admission: RE | Admit: 2023-03-30 | Discharge: 2023-03-30 | Disposition: A | Discharge status: Home / Self Care | Payer: Managed Care, Other (non HMO) | Source: Ambulatory Visit | Attending: Orthopedic Surgery

## 2023-03-30 DIAGNOSIS — M75101 Unspecified rotator cuff tear or rupture of right shoulder, not specified as traumatic: Secondary | ICD-10-CM

## 2023-04-10 ENCOUNTER — Other Ambulatory Visit: Payer: Managed Care, Other (non HMO)

## 2023-05-14 ENCOUNTER — Other Ambulatory Visit: Payer: Self-pay | Admitting: Family

## 2023-05-14 ENCOUNTER — Other Ambulatory Visit: Payer: Self-pay | Admitting: Primary Care

## 2023-05-14 ENCOUNTER — Other Ambulatory Visit: Payer: Self-pay | Admitting: Family Medicine

## 2023-05-14 DIAGNOSIS — J3089 Other allergic rhinitis: Secondary | ICD-10-CM

## 2023-05-14 DIAGNOSIS — J029 Acute pharyngitis, unspecified: Secondary | ICD-10-CM

## 2023-05-14 DIAGNOSIS — J40 Bronchitis, not specified as acute or chronic: Secondary | ICD-10-CM

## 2023-05-14 DIAGNOSIS — F418 Other specified anxiety disorders: Secondary | ICD-10-CM

## 2023-05-14 DIAGNOSIS — Z20822 Contact with and (suspected) exposure to covid-19: Secondary | ICD-10-CM

## 2023-05-14 DIAGNOSIS — A6000 Herpesviral infection of urogenital system, unspecified: Secondary | ICD-10-CM

## 2023-06-05 ENCOUNTER — Telehealth: Payer: Self-pay | Admitting: Primary Care

## 2023-06-11 ENCOUNTER — Other Ambulatory Visit: Payer: Self-pay | Admitting: Primary Care

## 2023-06-11 DIAGNOSIS — F418 Other specified anxiety disorders: Secondary | ICD-10-CM

## 2023-08-15 ENCOUNTER — Other Ambulatory Visit: Payer: Self-pay | Admitting: Primary Care

## 2023-11-01 ENCOUNTER — Ambulatory Visit: Payer: Self-pay | Admitting: Primary Care

## 2023-11-12 ENCOUNTER — Ambulatory Visit: Payer: Self-pay | Admitting: Primary Care

## 2023-11-13 ENCOUNTER — Encounter: Payer: Self-pay | Admitting: Primary Care

## 2023-11-15 ENCOUNTER — Emergency Department (HOSPITAL_BASED_OUTPATIENT_CLINIC_OR_DEPARTMENT_OTHER): Payer: Self-pay

## 2023-11-15 ENCOUNTER — Encounter (HOSPITAL_BASED_OUTPATIENT_CLINIC_OR_DEPARTMENT_OTHER): Payer: Self-pay

## 2023-11-15 ENCOUNTER — Other Ambulatory Visit: Payer: Self-pay

## 2023-11-15 ENCOUNTER — Emergency Department (HOSPITAL_BASED_OUTPATIENT_CLINIC_OR_DEPARTMENT_OTHER)
Admission: EM | Admit: 2023-11-15 | Discharge: 2023-11-15 | Disposition: A | Discharge status: Home / Self Care | Payer: Self-pay | Attending: Emergency Medicine | Admitting: Emergency Medicine

## 2023-11-15 DIAGNOSIS — M25572 Pain in left ankle and joints of left foot: Secondary | ICD-10-CM | POA: Insufficient documentation

## 2023-11-15 DIAGNOSIS — Z91040 Latex allergy status: Secondary | ICD-10-CM | POA: Insufficient documentation

## 2023-11-15 DIAGNOSIS — Z7982 Long term (current) use of aspirin: Secondary | ICD-10-CM | POA: Insufficient documentation

## 2023-11-15 MED ORDER — OXYCODONE-ACETAMINOPHEN 5-325 MG PO TABS
1.0000 | ORAL_TABLET | Freq: Once | ORAL | Status: AC
  Administered 2023-11-15: 1 via ORAL
  Filled 2023-11-15: qty 1

## 2023-11-15 NOTE — ED Provider Notes (Signed)
[place] EMERGENCY DEPARTMENT AT [facility] Provider Note   CSN: [id number] Arrival date & time: 11/15/23  0028     History  Chief Complaint  Patient presents with   Foot Pain    Samantha Hall is a 62 y.o. female.  Atraumatic left ankle pain and swelling started today.  Patient states that she was up doing quite a bit throughout the day but was wearing sneakers.  She states she did chase the kids around a little bit but does not recall any injury to her ankle.  She states that when she went to sit down after couple seconds she had a acute sharp searing pain to the left lateral ankle that then radiated up towards her knee on the outside.  She tried some essential oils which did not seem to help much she tried rest which did not seem to help much.  She presents here for further evaluation.  She states that this time the swelling seems to be a little bit better.  She could not bear weight initially because of the pain.  No history of blood clots.  Once again no injuries.  No fevers, redness to the area.  No recent illnesses.   Foot Pain       Home Medications Prior to Admission medications   Medication Sig Start Date End Date Taking? Authorizing Provider  albuterol (PROVENTIL) (2.5 MG/3ML) 0.083% nebulizer solution Take 3 mLs (2.5 mg total) by nebulization every 4 (four) hours as needed for wheezing or shortness of breath. 07/29/21   Eustaquio Boyden, MD  albuterol (VENTOLIN HFA) 108 (90 Base) MCG/ACT inhaler INHALE 2 PUFFS INTO THE LUNGS EVERY 6 HOURS AS NEEDED FOR SHORTNESS OF BREATH 06/16/22   Doreene Nest, NP  amLODipine (NORVASC) 2.5 MG tablet Take 2.5 mg by mouth daily.    [provider]  aspirin 81 MG EC tablet Take by mouth.    [provider]  b complex vitamins tablet Take 1 tablet by mouth daily.    [provider]  budesonide (PULMICORT) 0.5 MG/2ML nebulizer solution Take 2 mLs (0.5 mg total) by nebulization in the morning  and at bedtime. 07/29/21   Eustaquio Boyden, MD  Cholecalciferol (VITAMIN D) 2000 units CAPS Take by mouth.    [provider]  diclofenac (VOLTAREN) 75 MG EC tablet TAKE 1 TABLET (75 MG TOTAL) BY MOUTH 2 (TWO) TIMES DAILY AS NEEDED (FOR LEG PAIN). 08/15/23   Doreene Nest, NP  Dupilumab 300 MG/2ML SOPN Inject 300 mg into the skin every 14 (fourteen) days. Patient not taking: Reported on 03/14/2023    [provider]  estradiol (ESTRACE VAGINAL) 0.1 MG/GM vaginal cream Place 1 Applicatorful vaginally 3 (three) times a week. 05/14/20   Tresea Mall, CNM  fexofenadine (ALLEGRA) 180 MG tablet Take 1 tablet (180 mg total) by mouth daily as needed. 08/29/22   Mort Sawyers, FNP  fluticasone (FLONASE) 50 MCG/ACT nasal spray SPRAY 2 SPRAYS INTO EACH NOSTRIL EVERY DAY 05/15/23   Mort Sawyers, FNP  Fluticasone-Umeclidin-Vilant (TRELEGY ELLIPTA) 100-62.5-25 MCG/ACT AEPB Inhale into the lungs.    [provider]  hydrOXYzine (ATARAX) 10 MG tablet TAKE 1 TABLET (10 MG TOTAL) BY MOUTH TWICE A DAY AS NEEDED FOR ANXIETY 05/14/23   Doreene Nest, NP  montelukast (SINGULAIR) 10 MG tablet Take 10 mg by mouth at bedtime. 10/12/21   [provider]  Nebulizers (COMPRESSOR/NEBULIZER) MISC 1 Units by Does not apply route every 4 (four) hours as needed. 06/18/21  Irean Hong, MD  olopatadine (PATANOL) 0.1 % ophthalmic solution PLACE 1 DROP INTO BOTH EYES 2 TIMES DAILY AS NEEDED FOR ALLERGIES. 05/14/23   Doreene Nest, NP  omeprazole (PRILOSEC) 20 MG capsule Take 1 capsule (20 mg total) by mouth daily. For heartburn Patient taking differently: Take 20 mg by mouth daily as needed. For heartburn 05/04/22   Doreene Nest, NP  potassium chloride (KLOR-CON) 10 MEQ tablet Take 10 mEq by mouth daily. 04/04/22   [provider]  predniSONE (DELTASONE) 20 MG tablet Take 1 tablet (20 mg total) by mouth daily with breakfast. 08/09/22   Tower, Audrie Gallus, MD  Spacer/Aero-Holding  [facility] Use as directed with albuterol inhaler 07/29/21   Eustaquio Boyden, MD  SUMAtriptan (IMITREX) 25 MG tablet PLEASE SEE ATTACHED FOR DETAILED DIRECTIONS Patient not taking: Reported on 03/14/2023 12/18/19   [provider]  triamcinolone cream (KENALOG) 0.5 % APPLY TO AFFECTED AREA TWICE A DAY 08/06/19   Doreene Nest, NP  TURMERIC PO Take 4 capsules by mouth daily.    [provider]  valACYclovir (VALTREX) 500 MG tablet Take 1 tablet by mouth twice daily x3 days as needed 05/14/23   Doreene Nest, NP      Allergies    Cefazolin, Lidocaine, Losartan, Latex, Other, and Sulfa antibiotics    Review of Systems   Review of Systems  Physical Exam Updated Vital Signs BP 138/66 (BP Location: Right Arm)   Pulse 65   Temp 98.2 F (36.8 C) (Oral)   Resp 18   Ht 5\' 5"  (1.651 m)   Wt 77.1 kg   SpO2 99%   BMI 28.29 kg/m  Physical Exam Vitals and nursing note reviewed.  Constitutional:      Appearance: She is well-developed.  HENT:     Head: Normocephalic and atraumatic.  Cardiovascular:     Rate and Rhythm: Normal rate and regular rhythm.  Pulmonary:     Effort: No respiratory distress.     Breath sounds: No stridor.  Abdominal:     General: There is no distension.  Musculoskeletal:        General: Swelling and tenderness (Left lateral ankle) present. No deformity or signs of injury.     Cervical back: Normal range of motion.  Neurological:     Mental Status: She is alert.     ED Results / Procedures / Treatments   Labs (all labs ordered are listed, but only abnormal results are displayed) Labs Reviewed - No data to display  EKG None  Radiology DG Ankle Left Port  Result Date: 11/15/2023 CLINICAL DATA:  Left ankle pain without known injury. EXAM: PORTABLE LEFT ANKLE - 2 VIEW COMPARISON:  None Available. FINDINGS: There is no evidence of fracture, dislocation, or joint effusion. There is no evidence of arthropathy or other focal bone  abnormality. Soft tissues are unremarkable. IMPRESSION: Negative. Electronically Signed   By: Minerva Fester M.D.   On: 11/15/2023 03:30    Procedures Procedures    Medications Ordered in ED Medications  oxyCODONE-acetaminophen (PERCOCET/ROXICET) 5-325 MG per tablet 1 tablet (1 tablet Oral Given 11/15/23 0210)    ED Course/ Medical Decision Making/ A&P                                 Medical Decision Making Amount and/or Complexity of Data Reviewed Radiology: ordered.  Risk Prescription drug management.   X-ray without  any obvious injury on my interpretation.  Radiology read reviewed.  Secondary to difficulty to bear weight and swelling consider DVT although atypical presentation with mostly left ankle pain but she also has unexplained left lower leg edema and the pain does radiate up towards her calf.  She would have that done later today.  For now we will put in a cam walker for comfort.  Discussed calf exercises to reduce the chance of developing a clot if she does not already have 1.  Will return for ultrasound today or tomorrow.  Follow-up with PCP or orthopedics next week if not improving.   Final Clinical Impression(s) / ED Diagnoses Final diagnoses:  Acute left ankle pain    Rx / DC Orders ED Discharge Orders          Ordered    US Venous Img Lower Unilateral Left        11/15/23 0343              Traniya Prichett, Barbara Cower, MD 11/15/23 [phone]

## 2023-11-15 NOTE — ED Triage Notes (Signed)
Complaining of pain in the left ankle that started this evening. Does not remember injuring her foot. Did stand on her feet most of the day cooking. She said she can not put pressure on that foot.

## 2023-11-15 NOTE — ED Notes (Signed)
Pt assisted to restroom by this RN via wheelchair

## 2023-11-16 ENCOUNTER — Other Ambulatory Visit (HOSPITAL_BASED_OUTPATIENT_CLINIC_OR_DEPARTMENT_OTHER): Payer: Self-pay | Admitting: Emergency Medicine

## 2023-11-16 ENCOUNTER — Ambulatory Visit (HOSPITAL_BASED_OUTPATIENT_CLINIC_OR_DEPARTMENT_OTHER)
Admission: RE | Admit: 2023-11-16 | Discharge: 2023-11-16 | Disposition: A | Discharge status: Home / Self Care | Payer: Self-pay | Source: Ambulatory Visit | Attending: Emergency Medicine | Admitting: Emergency Medicine

## 2023-11-16 DIAGNOSIS — M25572 Pain in left ankle and joints of left foot: Secondary | ICD-10-CM | POA: Insufficient documentation

## 2024-01-24 ENCOUNTER — Other Ambulatory Visit: Payer: Self-pay | Admitting: Internal Medicine

## 2024-01-24 DIAGNOSIS — E859 Amyloidosis, unspecified: Secondary | ICD-10-CM

## 2024-01-25 ENCOUNTER — Other Ambulatory Visit: Payer: Self-pay | Admitting: Internal Medicine

## 2024-01-25 DIAGNOSIS — E859 Amyloidosis, unspecified: Secondary | ICD-10-CM

## 2024-01-25 DIAGNOSIS — R079 Chest pain, unspecified: Secondary | ICD-10-CM

## 2024-02-04 ENCOUNTER — Encounter: Payer: Self-pay | Admitting: Primary Care

## 2024-02-04 LAB — HM MAMMOGRAPHY

## 2024-02-06 ENCOUNTER — Ambulatory Visit
Admission: RE | Admit: 2024-02-06 | Discharge: 2024-02-06 | Disposition: A | Discharge status: Home / Self Care | Payer: Self-pay | Source: Ambulatory Visit | Attending: Internal Medicine | Admitting: Internal Medicine

## 2024-02-06 DIAGNOSIS — R079 Chest pain, unspecified: Secondary | ICD-10-CM | POA: Insufficient documentation

## 2024-02-06 DIAGNOSIS — E859 Amyloidosis, unspecified: Secondary | ICD-10-CM | POA: Insufficient documentation

## 2024-04-07 ENCOUNTER — Encounter (HOSPITAL_COMMUNITY): Payer: Self-pay

## 2024-04-09 ENCOUNTER — Other Ambulatory Visit: Payer: Self-pay | Admitting: Internal Medicine

## 2024-04-09 ENCOUNTER — Ambulatory Visit
Admission: RE | Admit: 2024-04-09 | Discharge: 2024-04-09 | Disposition: A | Discharge status: Home / Self Care | Payer: Self-pay | Source: Ambulatory Visit | Attending: Internal Medicine | Admitting: Internal Medicine

## 2024-04-09 DIAGNOSIS — E859 Amyloidosis, unspecified: Secondary | ICD-10-CM

## 2024-04-09 MED ORDER — GADOBUTROL 1 MMOL/ML IV SOLN: 10.0000 mL | Freq: Once | INTRAVENOUS | Status: DC | PRN

## 2024-05-29 ENCOUNTER — Encounter: Payer: Self-pay | Admitting: Obstetrics and Gynecology

## 2024-05-29 ENCOUNTER — Ambulatory Visit (INDEPENDENT_AMBULATORY_CARE_PROVIDER_SITE_OTHER): Payer: Self-pay | Admitting: Obstetrics and Gynecology

## 2024-05-29 VITALS — BP 128/66 | HR 69 | Ht 65.0 in | Wt 169.0 lb

## 2024-05-29 DIAGNOSIS — N898 Other specified noninflammatory disorders of vagina: Secondary | ICD-10-CM

## 2024-05-29 LAB — POCT WET PREP WITH KOH
Clue Cells Wet Prep HPF POC: NEGATIVE
KOH Prep POC: NEGATIVE
Trichomonas, UA: NEGATIVE
Yeast Wet Prep HPF POC: NEGATIVE

## 2024-05-29 NOTE — Patient Instructions (Signed)
 I value your feedback and you entrusting [place] with your care. If you get a [place] patient survey, I would appreciate you taking the time to let us know about your experience today. Thank you! ? ? ?

## 2024-05-29 NOTE — Addendum Note (Signed)
 Addended by: Alyn Judge B on: 05/29/2024 12:12 PM   Modules accepted: Orders

## 2024-05-29 NOTE — Progress Notes (Addendum)
 Samantha John, NP   Chief Complaint  Patient presents with   Vaginal Discharge    Itching and Irritation, no odor x 1 week    HPI:      [name] is a 63 y.o. G1P0101 whose LMP was No LMP recorded. Patient has had a hysterectomy., presents today for NP> 3 yrs eval for increased vag d/c with irritation, no fishy odor. D/c is clear and pt feels the dried d/c is causing ext irritation. Treating with triamcinolone  crm ext BID for a few days with some improvement. No external lesions. No recent sexual activity/no new partners. Had similar sx 1/21 with neg cultures. Tried vag ERT 5/21 without sx improvement. 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 Wearing cotton underwear, using dove sens skin soap and dryer sheets. No change in soaps/detergents. Wearing thin pads since d/c started but doesn't usually wear anything. No urin sx except urinary frequency due to increased water intake.   S/p TAH due to leio/AUB. No vag bleeding/PMB.   Patient Active Problem List   Diagnosis Date Noted   Non-seasonal allergic rhinitis 08/29/2022   Decreased GFR 08/17/2022   Family history of amyloidosis 08/17/2022   Sjogren's syndrome (HCC) 08/17/2022   Genital herpes 05/03/2022   Asthma 02/06/2022   OSA (obstructive sleep apnea) 02/06/2022   Frequent headaches 02/06/2022   Vaginal atrophy 05/16/2020   Vitamin D  deficiency 04/30/2020   Liver lesion, left lobe 11/17/2019   Kidney lesion, native, right 11/17/2019   Constipation 11/17/2019   Facial paresthesia 03/11/2019   Bilateral hand pain 12/27/2018   Chronic shoulder pain 06/10/2018   Elevated LFTs 06/10/2018   Prediabetes 06/10/2018   Situational anxiety 12/05/2017   Chronic low back pain 10/31/2017   Osteoarthritis 02/02/2017   Preventative health care 02/02/2017   Hyperlipidemia 02/02/2017   Essential hypertension 11/22/2015    Past Surgical History:  Procedure Laterality Date   ABDOMINAL HYSTERECTOMY  12/18/2000    CHOLECYSTECTOMY  12/18/2013   OTHER SURGICAL HISTORY     Shoulder 03/2023   TUBAL LIGATION  12/18/1982    Family History  Problem Relation Age of Onset   Diabetes Mother    Heart disease Mother    Heart disease Father     Social History   Socioeconomic History   Marital status: Married    Spouse name: Not on file   Number of children: Not on file   Years of education: Not on file   Highest education level: Not on file  Occupational History   Not on file  Tobacco Use   Smoking status: Never   Smokeless tobacco: Never  Vaping Use   Vaping status: Never Used  Substance and Sexual Activity   Alcohol use: Not Currently   Drug use: No   Sexual activity: Yes    Birth control/protection: Surgical    Comment: Hysterectomy  Other Topics Concern   Not on file  Social History Narrative   Married.   Highest level of education Bachelors.    Works as a Child psychotherapist.   Social Drivers of Corporate investment banker Strain: Not on file  Food Insecurity: Not on file  Transportation Needs: Not on file  Physical Activity: Not on file  Stress: Not on file  Social Connections: Unknown (05/01/2022)   [facility]   Social Network    Social Network: Not on file  Intimate Partner Violence: Unknown (03/23/2022)   Received from [facility]  HITS    Physically Hurt: Not on file    Insult or Talk Down To: Not on file    Threaten Physical Harm: Not on file    Scream or Curse: Not on file    Outpatient Medications Prior to Visit  Medication Sig Dispense Refill   albuterol  (PROVENTIL ) (2.5 MG/3ML) 0.083% nebulizer solution Take 3 mLs (2.5 mg total) by nebulization every 4 (four) hours as needed for wheezing or shortness of breath. 75 mL 1   albuterol  (VENTOLIN  HFA) 108 (90 Base) MCG/ACT inhaler Inhale 2 puffs into the lungs.     amLODipine  (NORVASC ) 5 MG tablet Take 5 mg by mouth daily.     aspirin 81 MG EC tablet Take by mouth.     budesonide  (PULMICORT ) 0.25 MG/2ML  nebulizer solution INHALE 1 VIAL VIA BY NEBULIZATION 2 (TWO) TIMES DAILY AS NEEDED     Cholecalciferol (VITAMIN D ) 2000 units CAPS Take by mouth.     diclofenac  (VOLTAREN ) 75 MG EC tablet TAKE 1 TABLET (75 MG TOTAL) BY MOUTH 2 (TWO) TIMES DAILY AS NEEDED (FOR LEG PAIN). 180 tablet 0   doxycycline  (VIBRA -TABS) 100 MG tablet Take one tablet by mouth with food and 8 oz water. Do not lie down for at least 30 minutes after.     EPINEPHrine 0.3 mg/0.3 mL IJ SOAJ injection INJECT 0.3 ML (0.3 MG TOTAL) INTO THE MUSCLE ONCE AS NEEDED FOR ANAPHYLAXIS FOR UP TO 1 DOSE     fexofenadine  (ALLEGRA ) 180 MG tablet Take 1 tablet (180 mg total) by mouth daily as needed. 90 tablet 1   Fluocinolone Acetonide Scalp 0.01 % OIL Apply to scalp 3 time per week     fluticasone  (FLONASE ) 50 MCG/ACT nasal spray SPRAY 2 SPRAYS INTO EACH NOSTRIL EVERY DAY 48 mL 2   Fluticasone -Umeclidin-Vilant (TRELEGY ELLIPTA) 100-62.5-25 MCG/ACT AEPB Inhale into the lungs.     montelukast (SINGULAIR) 10 MG tablet Take 10 mg by mouth at bedtime.     Nebulizers (COMPRESSOR/NEBULIZER) MISC 1 Units by Does not apply route every 4 (four) hours as needed. 1 each 0   omeprazole  (PRILOSEC) 20 MG capsule Take 1 capsule (20 mg total) by mouth daily. For heartburn (Patient taking differently: Take 20 mg by mouth daily as needed. For heartburn) 90 capsule 3   predniSONE  (DELTASONE ) 20 MG tablet Take 1 tablet (20 mg total) by mouth daily with breakfast. 7 tablet 0   predniSONE  (DELTASONE ) 5 MG tablet Take 5 mg by mouth daily.     Spacer/Aero-Holding Samantha Hall Use as directed with albuterol  inhaler 1 Units 0   triamcinolone  cream (KENALOG ) 0.5 % APPLY TO AFFECTED AREA TWICE A DAY 30 g 0   TURMERIC PO Take 4 capsules by mouth daily.     valACYclovir  (VALTREX ) 500 MG tablet Take 1 tablet by mouth twice daily x3 days as needed 18 tablet 0   estradiol  (ESTRACE  VAGINAL) 0.1 MG/GM vaginal cream Place 1 Applicatorful vaginally 3 (three) times a week. (Patient  not taking: Reported on 05/29/2024) 42.5 g 3   albuterol  (VENTOLIN  HFA) 108 (90 Base) MCG/ACT inhaler INHALE 2 PUFFS INTO THE LUNGS EVERY 6 HOURS AS NEEDED FOR SHORTNESS OF BREATH 8.5 each 0   amLODipine  (NORVASC ) 2.5 MG tablet Take 2.5 mg by mouth daily.     b complex vitamins tablet Take 1 tablet by mouth daily.     budesonide  (PULMICORT ) 0.5 MG/2ML nebulizer solution Take 2 mLs (0.5 mg total) by nebulization in the morning and at  bedtime. 60 mL 0   Dupilumab 300 MG/2ML SOPN Inject 300 mg into the skin every 14 (fourteen) days. (Patient not taking: Reported on 03/14/2023)     hydrOXYzine  (ATARAX ) 10 MG tablet TAKE 1 TABLET (10 MG TOTAL) BY MOUTH TWICE A DAY AS NEEDED FOR ANXIETY 60 tablet 0   olopatadine  (PATANOL) 0.1 % ophthalmic solution PLACE 1 DROP INTO BOTH EYES 2 TIMES DAILY AS NEEDED FOR ALLERGIES. 5 mL 0   potassium chloride (KLOR-CON) 10 MEQ tablet Take 10 mEq by mouth daily.     SUMAtriptan (IMITREX) 25 MG tablet PLEASE SEE ATTACHED FOR DETAILED DIRECTIONS (Patient not taking: Reported on 03/14/2023)     No facility-administered medications prior to visit.      ROS:  Review of Systems  Constitutional:  Negative for fever.  Gastrointestinal:  Negative for blood in stool, constipation, diarrhea, nausea and vomiting.  Genitourinary:  Positive for vaginal discharge. Negative for dyspareunia, dysuria, flank pain, frequency, hematuria, urgency, vaginal bleeding and vaginal pain.  Musculoskeletal:  Negative for back pain.  Skin:  Negative for rash.   BREAST: No symptoms   OBJECTIVE:   Vitals:  BP 128/66   Pulse 69   Ht 5' 5 (1.651 m)   Wt 169 lb (76.7 kg)   BMI 28.12 kg/m   Physical Exam Vitals reviewed.  Constitutional:      Appearance: She is well-developed.  Pulmonary:     Effort: Pulmonary effort is normal.  Genitourinary:    General: Normal vulva.     Pubic Area: No rash.      Labia:        Right: No rash, tenderness or lesion.        Left: No rash, tenderness  or lesion.      Vagina: Vaginal discharge present. No erythema or tenderness.     Uterus: Not tender.      Adnexa:        Right: No tenderness.         Left: No tenderness.       Comments: WATERY WHITISH D/C  Musculoskeletal:        General: Normal range of motion.     Cervical back: Normal range of motion.   Skin:    General: Skin is warm and dry.   Neurological:     General: No focal deficit present.     Mental Status: She is alert and oriented to person, place, and time.   Psychiatric:        Mood and Affect: Mood normal.        Behavior: Behavior normal.        Thought Content: Thought content normal.        Judgment: Judgment normal.     Results: Results for orders placed or performed in visit on 05/29/24 (from the past 24 hours)  POCT Wet Prep with KOH     Status: Normal   Collection Time: 05/29/24 11:49 AM  Result Value Ref Range   Trichomonas, UA Negative    Clue Cells Wet Prep HPF POC neg    Epithelial Wet Prep HPF POC     Yeast Wet Prep HPF POC neg    Bacteria Wet Prep HPF POC     RBC Wet Prep HPF POC     WBC Wet Prep HPF POC     KOH Prep POC Negative Negative     Assessment/Plan: Vaginal discharge - Plan: POCT Wet Prep with KOH,  NuSwab Vaginitis (VG); neg wet prep,  pos exam. Check culture. Will f/u with results. D/c dryer sheets for now.    Return if symptoms worsen or fail to improve.  Jaz Mallick B. Blessing Ozga, PA-C 05/29/2024 12:11 PM

## 2024-05-30 ENCOUNTER — Encounter: Payer: Self-pay | Admitting: Obstetrics and Gynecology

## 2024-05-31 LAB — NUSWAB VAGINITIS (VG)
Candida albicans, NAA: NEGATIVE
Candida glabrata, NAA: NEGATIVE
Trich vag by NAA: NEGATIVE

## 2024-06-02 ENCOUNTER — Ambulatory Visit: Payer: Self-pay | Admitting: Obstetrics and Gynecology

## 2024-06-02 DIAGNOSIS — N898 Other specified noninflammatory disorders of vagina: Secondary | ICD-10-CM

## 2024-06-02 NOTE — Progress Notes (Signed)
 Can you see if LC can add on mycoplasma screen to specimen? Thx.

## 2024-06-03 ENCOUNTER — Other Ambulatory Visit: Payer: Self-pay | Admitting: Obstetrics and Gynecology

## 2024-06-03 DIAGNOSIS — N898 Other specified noninflammatory disorders of vagina: Secondary | ICD-10-CM

## 2024-06-03 NOTE — Progress Notes (Signed)
 Add mycoplasma culture due to neg nuswab and sx.

## 2024-06-05 LAB — SPECIMEN STATUS REPORT

## 2024-06-05 LAB — GENITAL MYCOPLASMAS NAA, SWAB
Mycoplasma genitalium NAA: NEGATIVE
Mycoplasma hominis NAA: NEGATIVE
Ureaplasma spp NAA: NEGATIVE

## 2024-06-06 MED ORDER — DOXYCYCLINE HYCLATE 100 MG PO CAPS: 100.0000 mg | ORAL_CAPSULE | Freq: Two times a day (BID) | ORAL | 0 refills | Status: DC

## 2024-06-09 ENCOUNTER — Other Ambulatory Visit: Payer: Self-pay | Admitting: Obstetrics and Gynecology

## 2024-06-09 DIAGNOSIS — N898 Other specified noninflammatory disorders of vagina: Secondary | ICD-10-CM

## 2024-06-09 DIAGNOSIS — N952 Postmenopausal atrophic vaginitis: Secondary | ICD-10-CM

## 2024-06-09 MED ORDER — ESTRADIOL 0.1 MG/GM VA CREA
TOPICAL_CREAM | VAGINAL | 0 refills | Status: AC
Start: 2024-06-09 — End: ?

## 2024-06-09 NOTE — Progress Notes (Signed)
 Rx estrace  for vaginal d/c, neg testing.

## 2024-09-24 DIAGNOSIS — R7303 Prediabetes: Secondary | ICD-10-CM

## 2024-09-24 DIAGNOSIS — R7989 Other specified abnormal findings of blood chemistry: Secondary | ICD-10-CM

## 2024-09-24 DIAGNOSIS — E785 Hyperlipidemia, unspecified: Secondary | ICD-10-CM

## 2024-09-24 DIAGNOSIS — E559 Vitamin D deficiency, unspecified: Secondary | ICD-10-CM

## 2024-10-01 ENCOUNTER — Ambulatory Visit: Payer: Self-pay | Admitting: Primary Care

## 2024-10-01 ENCOUNTER — Other Ambulatory Visit (INDEPENDENT_AMBULATORY_CARE_PROVIDER_SITE_OTHER): Payer: Self-pay

## 2024-10-01 DIAGNOSIS — E559 Vitamin D deficiency, unspecified: Secondary | ICD-10-CM

## 2024-10-01 DIAGNOSIS — E785 Hyperlipidemia, unspecified: Secondary | ICD-10-CM

## 2024-10-01 DIAGNOSIS — R7989 Other specified abnormal findings of blood chemistry: Secondary | ICD-10-CM

## 2024-10-01 DIAGNOSIS — R7303 Prediabetes: Secondary | ICD-10-CM

## 2024-10-01 LAB — COMPREHENSIVE METABOLIC PANEL WITH GFR
ALT: 17 U/L (ref 0–35)
AST: 16 U/L (ref 0–37)
Albumin: 4.1 g/dL (ref 3.5–5.2)
Alkaline Phosphatase: 78 U/L (ref 39–117)
BUN: 14 mg/dL (ref 6–23)
CO2: 27 meq/L (ref 19–32)
Calcium: 8.9 mg/dL (ref 8.4–10.5)
Chloride: 105 meq/L (ref 96–112)
Creatinine, Ser: 0.91 mg/dL (ref 0.40–1.20)
GFR: 66.76 mL/min (ref >60.00)
Glucose, Bld: 107 mg/dL — ABNORMAL HIGH (ref 70–99)
Potassium: 3.9 meq/L (ref 3.5–5.1)
Sodium: 138 meq/L (ref 135–145)
Total Bilirubin: 0.7 mg/dL (ref 0.2–1.2)
Total Protein: 6.7 g/dL (ref 6.0–8.3)

## 2024-10-01 LAB — LIPID PANEL
Cholesterol: 255 mg/dL — ABNORMAL HIGH (ref 0–200)
HDL: 86.4 mg/dL (ref >39.00)
LDL Cholesterol: 154 mg/dL — ABNORMAL HIGH (ref 0–99)
NonHDL: 168.23
Total CHOL/HDL Ratio: 3
Triglycerides: 69 mg/dL (ref 0.0–149.0)
VLDL: 13.8 mg/dL (ref 0.0–40.0)

## 2024-10-01 LAB — HEMOGLOBIN A1C: Hgb A1c MFr Bld: 6.4 % (ref 4.6–6.5)

## 2024-10-01 LAB — VITAMIN D 25 HYDROXY (VIT D DEFICIENCY, FRACTURES): VITD: 58.47 ng/mL (ref 30.00–100.00)

## 2024-10-06 ENCOUNTER — Ambulatory Visit (INDEPENDENT_AMBULATORY_CARE_PROVIDER_SITE_OTHER): Payer: Self-pay | Admitting: Primary Care

## 2024-10-06 ENCOUNTER — Encounter: Payer: Self-pay | Admitting: Primary Care

## 2024-10-06 ENCOUNTER — Encounter: Payer: Self-pay | Admitting: *Deleted

## 2024-10-06 VITALS — BP 132/64 | HR 77 | Temp 97.3°F | Ht 65.0 in | Wt 203.0 lb

## 2024-10-06 DIAGNOSIS — M3500 Sicca syndrome, unspecified: Secondary | ICD-10-CM

## 2024-10-06 DIAGNOSIS — I10 Essential (primary) hypertension: Secondary | ICD-10-CM

## 2024-10-06 DIAGNOSIS — N6314 Unspecified lump in the right breast, lower inner quadrant: Secondary | ICD-10-CM | POA: Insufficient documentation

## 2024-10-06 DIAGNOSIS — J4530 Mild persistent asthma, uncomplicated: Secondary | ICD-10-CM

## 2024-10-06 DIAGNOSIS — Z0001 Encounter for general adult medical examination with abnormal findings: Secondary | ICD-10-CM

## 2024-10-06 DIAGNOSIS — K769 Liver disease, unspecified: Secondary | ICD-10-CM

## 2024-10-06 DIAGNOSIS — E559 Vitamin D deficiency, unspecified: Secondary | ICD-10-CM

## 2024-10-06 DIAGNOSIS — N644 Mastodynia: Secondary | ICD-10-CM

## 2024-10-06 DIAGNOSIS — E785 Hyperlipidemia, unspecified: Secondary | ICD-10-CM

## 2024-10-06 DIAGNOSIS — A6000 Herpesviral infection of urogenital system, unspecified: Secondary | ICD-10-CM

## 2024-10-06 DIAGNOSIS — N952 Postmenopausal atrophic vaginitis: Secondary | ICD-10-CM

## 2024-10-06 DIAGNOSIS — R7303 Prediabetes: Secondary | ICD-10-CM

## 2024-10-06 NOTE — Assessment & Plan Note (Signed)
 Chronically elevated. Reviewed labs from October and January 2025.  Continue aspirin 81 mg daily. She declines statin therapy. Following with cardiology, office notes reviewed from May 2025.

## 2024-10-06 NOTE — Assessment & Plan Note (Signed)
 Could be cyst or breast densities. Cannot exclude malignancy.  Orders placed for bilateral diagnostic mammogram and bilateral breast ultrasounds.

## 2024-10-06 NOTE — Assessment & Plan Note (Signed)
 Repeat liver quadrant ultrasound ordered and pending. Reviewed LFTs from October 2025

## 2024-10-06 NOTE — Assessment & Plan Note (Signed)
 No concerns today.  Continue Valtrex  500 mg twice daily x 3 days as needed.

## 2024-10-06 NOTE — Patient Instructions (Signed)
 You will receive phone calls for the mammogram and ultrasounds.   Schedule a lab only appointment for 3 months to repeat the A1c test.  It was a pleasure to see you today!

## 2024-10-06 NOTE — Assessment & Plan Note (Signed)
 Declines influenza vaccine. So labs already probably NSAIDs okay left you are welcome you Mammogram  UTD< will get diagnostic based on HPI and exam Colonoscopy UTD, due 2031  Discussed the importance of a healthy diet and regular exercise in order for weight loss, and to reduce the risk of further co-morbidity.  Exam stable. Labs reviewed   Follow up in 1 year for repeat physical.

## 2024-10-06 NOTE — Assessment & Plan Note (Signed)
 Controlled. Reviewed labs from October 2025

## 2024-10-06 NOTE — Assessment & Plan Note (Addendum)
 Controlled overall. Following with cardiology, office notes reviewed from May 2025. Reviewed labs from October 2025  Continue lifestyle changes. Declines statin therapy.   Continue amlodipine  5 mg daily.

## 2024-10-06 NOTE — Assessment & Plan Note (Signed)
 Close to diagnosis of type 2 diabetes with A1c of 6.4.  Discussed this today.  She will continue to work on lifestyle changes. Repeat A1c in 3 months.

## 2024-10-06 NOTE — Assessment & Plan Note (Signed)
 Overall stable. Following with rheumatology, office notes reviewed in January 2025.  Continue prednisone  as needed.

## 2024-10-06 NOTE — Assessment & Plan Note (Signed)
 Follow-up with GYN. Continue Estrace  vaginal cream once weekly.

## 2024-10-06 NOTE — Assessment & Plan Note (Signed)
 Controlled. Following with pulmonology, office notes reviewed from April 2025  Continue Budesonide  0.25 mg nebulizers, Trelegy Ellipta 100-62.5-25 mcg, 1 puff daily, Singulair 10 mg HS, Allegra .

## 2024-10-06 NOTE — Progress Notes (Signed)
 Subjective:    Patient ID: Samantha Hall, female    DOB: Dec 21, 1960, 63 y.o.   MRN: [id number]  Samantha Hall is a very pleasant 63 y.o. female who presents today for complete physical and follow up of chronic conditions.  She would also like to discuss breast pain.  Chronic for the last few months with intermittent right sensation/pressure to the right lateral and upper breast.  She denies skin texture changes, lumps.  Her last mammogram was completed in February 2025, BI-RADS 1.  Immunizations: -Tetanus: Completed in 2021 -Influenza: Declines influenza vaccine.   -Shingles: Completed Shingrix series  Diet: Fair diet.  Exercise: No regular exercise.  Eye exam: Completes annually  Dental exam: Completes semi-annually    Pap Smear: Hysterectomy Mammogram: Completed in February 2025  Colonoscopy: Completed in 2021, due 2031   BP Readings from Last 3 Encounters:  10/06/24 132/64  05/29/24 128/66  11/15/23 138/66      Review of Systems  Constitutional:  Negative for unexpected weight change.  HENT:  Negative for rhinorrhea.   Respiratory:  Negative for cough and shortness of breath.   Cardiovascular:  Negative for chest pain.  Gastrointestinal:  Negative for constipation and diarrhea.  Genitourinary:  Negative for difficulty urinating.  Musculoskeletal:  Positive for arthralgias. Negative for myalgias.  Skin:  Negative for rash.  Allergic/Immunologic: Negative for environmental allergies.  Neurological:  Negative for dizziness and headaches.  Psychiatric/Behavioral:  The patient is not nervous/anxious.          Past Medical History:  Diagnosis Date   Elevated antinuclear antibody (ANA) level    Negative workup for Lupus.   Fibroids    Fracture of left patella 04/30/2018   Hyperlipidemia    Hypertension    Controlled   Osteoarthritis    Pain of upper abdomen 12/27/2018   Sciatica    Vitamin D  deficiency     Social History   Socioeconomic History    Marital status: Married    Spouse name: Not on file   Number of children: Not on file   Years of education: Not on file   Highest education level: Not on file  Occupational History   Not on file  Tobacco Use   Smoking status: Never   Smokeless tobacco: Never  Vaping Use   Vaping status: Never Used  Substance and Sexual Activity   Alcohol use: Not Currently   Drug use: No   Sexual activity: Yes    Birth control/protection: Surgical    Comment: Hysterectomy  Other Topics Concern   Not on file  Social History Narrative   Married.   Highest level of education Bachelors.    Works as a Child psychotherapist.   Social Drivers of Corporate investment banker Strain: Not on file  Food Insecurity: Not on file  Transportation Needs: Not on file  Physical Activity: Not on file  Stress: Not on file  Social Connections: Unknown (05/01/2022)   Received from [facility]   Social Network    Social Network: Not on file  Intimate Partner Violence: Unknown (03/23/2022)   Received from [facility]   HITS    Physically Hurt: Not on file    Insult or Talk Down To: Not on file    Threaten Physical Harm: Not on file    Scream or Curse: Not on file    Past Surgical History:  Procedure Laterality Date   ABDOMINAL HYSTERECTOMY  12/18/2000   CHOLECYSTECTOMY  12/18/2013   OTHER SURGICAL  HISTORY     Shoulder 03/2023   TUBAL LIGATION  12/18/1982    Family History  Problem Relation Age of Onset   Diabetes Mother    Heart disease Mother    Heart disease Father     Allergies  Allergen Reactions   Cefazolin Swelling and Anaphylaxis    Difficulty breathing   Lidocaine Swelling   Losartan      Lowers heart rate and causes fatigue   Latex Rash    Contact rash   Other Rash    Contact rash   Sulfa Antibiotics Rash    Current Outpatient Medications on File Prior to Visit  Medication Sig Dispense Refill   albuterol  (PROVENTIL ) (2.5 MG/3ML) 0.083% nebulizer solution Take 3 mLs (2.5 mg  total) by nebulization every 4 (four) hours as needed for wheezing or shortness of breath. 75 mL 1   albuterol  (VENTOLIN  HFA) 108 (90 Base) MCG/ACT inhaler Inhale 2 puffs into the lungs.     amLODipine  (NORVASC ) 5 MG tablet Take 5 mg by mouth daily.     aspirin 81 MG EC tablet Take by mouth.     budesonide  (PULMICORT ) 0.25 MG/2ML nebulizer solution INHALE 1 VIAL VIA BY NEBULIZATION 2 (TWO) TIMES DAILY AS NEEDED     Cholecalciferol (VITAMIN D ) 2000 units CAPS Take by mouth.     diclofenac  (VOLTAREN ) 75 MG EC tablet TAKE 1 TABLET (75 MG TOTAL) BY MOUTH 2 (TWO) TIMES DAILY AS NEEDED (FOR LEG PAIN). 180 tablet 0   estradiol  (ESTRACE  VAGINAL) 0.1 MG/GM vaginal cream Insert 1 g vaginally nightly for 1 week, then 1 g weekly as maintenance 42.5 g 0   fexofenadine  (ALLEGRA ) 180 MG tablet Take 1 tablet (180 mg total) by mouth daily as needed. 90 tablet 1   Fluocinolone Acetonide Scalp 0.01 % OIL Apply to scalp 3 time per week     Fluticasone -Umeclidin-Vilant (TRELEGY ELLIPTA) 100-62.5-25 MCG/ACT AEPB Inhale into the lungs.     montelukast (SINGULAIR) 10 MG tablet Take 10 mg by mouth at bedtime.     Nebulizers (COMPRESSOR/NEBULIZER) MISC 1 Units by Does not apply route every 4 (four) hours as needed. 1 each 0   omeprazole  (PRILOSEC) 20 MG capsule Take 1 capsule (20 mg total) by mouth daily. For heartburn (Patient taking differently: Take 20 mg by mouth daily as needed. For heartburn) 90 capsule 3   predniSONE  (DELTASONE ) 20 MG tablet Take 1 tablet (20 mg total) by mouth daily with breakfast. 7 tablet 0   predniSONE  (DELTASONE ) 5 MG tablet Take 5 mg by mouth daily.     Spacer/Aero-Holding Raguel FRENCH Use as directed with albuterol  inhaler 1 Units 0   triamcinolone  cream (KENALOG ) 0.5 % APPLY TO AFFECTED AREA TWICE A DAY 30 g 0   TURMERIC PO Take 4 capsules by mouth daily.     EPINEPHrine 0.3 mg/0.3 mL IJ SOAJ injection INJECT 0.3 ML (0.3 MG TOTAL) INTO THE MUSCLE ONCE AS NEEDED FOR ANAPHYLAXIS FOR UP TO 1  DOSE (Patient not taking: Reported on 10/06/2024)     fluticasone  (FLONASE ) 50 MCG/ACT nasal spray SPRAY 2 SPRAYS INTO EACH NOSTRIL EVERY DAY (Patient not taking: Reported on 10/06/2024) 48 mL 2   valACYclovir  (VALTREX ) 500 MG tablet Take 1 tablet by mouth twice daily x3 days as needed (Patient not taking: Reported on 10/06/2024) 18 tablet 0   No current facility-administered medications on file prior to visit.    BP 132/64   Pulse 77   Temp (!) 97.3 F (36.3  C) (Temporal)   Ht 5' 5 (1.651 m)   Wt 203 lb (92.1 kg)   SpO2 97%   BMI 33.78 kg/m  Objective:   Physical Exam HENT:     Right Ear: Tympanic membrane and ear canal normal.     Left Ear: Tympanic membrane and ear canal normal.  Eyes:     Pupils: Pupils are equal, round, and reactive to light.  Cardiovascular:     Rate and Rhythm: Normal rate and regular rhythm.  Pulmonary:     Effort: Pulmonary effort is normal.     Breath sounds: Normal breath sounds.  Chest:  Breasts:    Right: Mass present. No skin change.     Left: No mass or skin change.       Comments: Approximately 1 cm soft deep tissue mass to right lower breast approximately 6-7 o'clock.  Nontender. Abdominal:     General: Bowel sounds are normal.     Palpations: Abdomen is soft.     Tenderness: There is no abdominal tenderness.  Musculoskeletal:        General: Normal range of motion.     Cervical back: Neck supple.  Skin:    General: Skin is warm and dry.  Neurological:     Mental Status: She is alert and oriented to person, place, and time.     Cranial Nerves: No cranial nerve deficit.     Deep Tendon Reflexes:     Reflex Scores:      Patellar reflexes are 2+ on the right side and 2+ on the left side. Psychiatric:        Mood and Affect: Mood normal.     Physical Exam        Assessment & Plan:  Encounter for annual general medical examination with abnormal findings in adult Assessment & Plan: Declines influenza vaccine. So labs  already probably NSAIDs okay left you are welcome you Mammogram  UTD< will get diagnostic based on HPI and exam Colonoscopy UTD, due 2031  Discussed the importance of a healthy diet and regular exercise in order for weight loss, and to reduce the risk of further co-morbidity.  Exam stable. Labs reviewed   Follow up in 1 year for repeat physical.    Essential hypertension Assessment & Plan: Controlled overall. Following with cardiology, office notes reviewed from May 2025. Reviewed labs from October 2025  Continue lifestyle changes. Declines statin therapy.   Continue amlodipine  5 mg daily.   Mild persistent asthma without complication Assessment & Plan: Controlled. Following with pulmonology, office notes reviewed from April 2025  Continue Budesonide  0.25 mg nebulizers, Trelegy Ellipta 100-62.5-25 mcg, 1 puff daily, Singulair 10 mg HS, Allegra .   Liver lesion, left lobe Assessment & Plan: Repeat liver quadrant ultrasound ordered and pending. Reviewed LFTs from October 2025  Orders: -     US  ABDOMEN LIMITED RUQ (LIVER/GB); Future  Vaginal atrophy Assessment & Plan: Follow-up with GYN. Continue Estrace  vaginal cream once weekly.   Genital herpes simplex, unspecified site Assessment & Plan: No concerns today.  Continue Valtrex  500 mg twice daily x 3 days as needed.   Vitamin D  deficiency Assessment & Plan: Controlled. Reviewed labs from October 2025   Sjogren's syndrome, with unspecified organ involvement Assessment & Plan: Overall stable. Following with rheumatology, office notes reviewed in January 2025.  Continue prednisone  as needed.    Prediabetes Assessment & Plan: Close to diagnosis of type 2 diabetes with A1c of 6.4.  Discussed this today.  She  will continue to work on lifestyle changes. Repeat A1c in 3 months.   Hyperlipidemia, unspecified hyperlipidemia type Assessment & Plan: Chronically elevated. Reviewed labs from October and  January 2025.  Continue aspirin 81 mg daily. She declines statin therapy. Following with cardiology, office notes reviewed from May 2025.   Breast pain -     MM 3D DIAGNOSTIC MAMMOGRAM BILATERAL BREAST; Future -     US  BREAST COMPLETE UNI LEFT INC AXILLA; Future -     US  BREAST COMPLETE UNI RIGHT INC AXILLA; Future  Mass of lower inner quadrant of right breast Assessment & Plan: Could be cyst or breast densities. Cannot exclude malignancy.  Orders placed for bilateral diagnostic mammogram and bilateral breast ultrasounds.  Orders: -     MM 3D DIAGNOSTIC MAMMOGRAM BILATERAL BREAST; Future -     US  BREAST COMPLETE UNI LEFT INC AXILLA; Future -     US  BREAST COMPLETE UNI RIGHT INC AXILLA; Future    Assessment and Plan Assessment & Plan         Comer MARLA Gaskins, NP    History of Present Illness

## 2024-10-07 ENCOUNTER — Other Ambulatory Visit: Payer: Self-pay | Admitting: Primary Care

## 2024-10-07 DIAGNOSIS — R928 Other abnormal and inconclusive findings on diagnostic imaging of breast: Secondary | ICD-10-CM

## 2024-10-07 DIAGNOSIS — N644 Mastodynia: Secondary | ICD-10-CM

## 2024-10-07 DIAGNOSIS — N6314 Unspecified lump in the right breast, lower inner quadrant: Secondary | ICD-10-CM

## 2024-10-09 ENCOUNTER — Inpatient Hospital Stay
Admission: RE | Admit: 2024-10-09 | Discharge: 2024-10-09 | Disposition: A | Discharge status: Home / Self Care | Payer: Self-pay | Source: Ambulatory Visit | Attending: Primary Care | Admitting: Primary Care

## 2024-10-09 ENCOUNTER — Other Ambulatory Visit: Payer: Self-pay | Admitting: *Deleted

## 2024-10-09 ENCOUNTER — Inpatient Hospital Stay
Admission: RE | Admit: 2024-10-09 | Discharge: 2024-10-09 | Disposition: A | Payer: Self-pay | Source: Ambulatory Visit | Attending: Primary Care

## 2024-10-09 DIAGNOSIS — Z1231 Encounter for screening mammogram for malignant neoplasm of breast: Secondary | ICD-10-CM

## 2024-10-13 ENCOUNTER — Telehealth: Payer: Self-pay

## 2024-10-13 ENCOUNTER — Ambulatory Visit
Admission: RE | Admit: 2024-10-13 | Discharge: 2024-10-13 | Disposition: A | Discharge status: Home / Self Care | Payer: Self-pay | Source: Ambulatory Visit | Attending: Primary Care | Admitting: Primary Care

## 2024-10-13 DIAGNOSIS — N644 Mastodynia: Secondary | ICD-10-CM

## 2024-10-13 DIAGNOSIS — K769 Liver disease, unspecified: Secondary | ICD-10-CM | POA: Insufficient documentation

## 2024-10-13 DIAGNOSIS — N6310 Unspecified lump in the right breast, unspecified quadrant: Secondary | ICD-10-CM

## 2024-10-13 NOTE — Telephone Encounter (Signed)
 Copied from CRM [id number]. Topic: Clinical - Request for Lab/Test Order >> Oct 13, 2024  9:06 AM Samantha Hall wrote: Reason for CRM: Pt would like to see if the mammogram could go to [place] [facility] instead of the place where it was sent. States that this way she will not get two bills, just one.  Patient callback is [phone]

## 2024-10-14 ENCOUNTER — Ambulatory Visit: Payer: Self-pay | Admitting: Primary Care

## 2024-10-18 NOTE — Telephone Encounter (Signed)
 I just changed these orders for GI breast center 1-2 days ago. I can see the order for GI. Is there something else I need to do?

## 2024-10-20 NOTE — Telephone Encounter (Signed)
 Mammogram has already been scheduled for the [facility].

## 2024-10-21 ENCOUNTER — Ambulatory Visit
Admission: RE | Admit: 2024-10-21 | Discharge: 2024-10-21 | Disposition: A | Discharge status: Home / Self Care | Payer: Self-pay | Source: Ambulatory Visit | Attending: Primary Care | Admitting: Primary Care

## 2024-10-21 DIAGNOSIS — N644 Mastodynia: Secondary | ICD-10-CM

## 2024-10-21 DIAGNOSIS — N6314 Unspecified lump in the right breast, lower inner quadrant: Secondary | ICD-10-CM

## 2024-12-19 ENCOUNTER — Other Ambulatory Visit
Admission: RE | Admit: 2024-12-19 | Discharge: 2024-12-19 | Disposition: A | Discharge status: Home / Self Care | Payer: Self-pay | Source: Ambulatory Visit | Attending: Internal Medicine | Admitting: Internal Medicine

## 2024-12-19 DIAGNOSIS — R079 Chest pain, unspecified: Secondary | ICD-10-CM | POA: Insufficient documentation

## 2024-12-19 DIAGNOSIS — I10 Essential (primary) hypertension: Secondary | ICD-10-CM | POA: Insufficient documentation

## 2024-12-19 LAB — TROPONIN T, HIGH SENSITIVITY: Troponin T High Sensitivity: <15 ng/L (ref 0–19)

## 2024-12-29 ENCOUNTER — Ambulatory Visit: Payer: Self-pay | Admitting: *Deleted

## 2024-12-29 NOTE — Telephone Encounter (Signed)
 Recommended ED for evaluation. Patient does not want to go to ED around others being sick. Reports sx are not as bad as Saturday. CAL , Tamara notified . Please advise.              FYI Only or Action Required?: FYI only for provider: ED advised.  Patient was last seen in primary care on 10/06/2024 by Gretta Comer POUR, NP.  Called Nurse Triage reporting Dizziness.  Symptoms began a week ago.  Interventions attempted: Rest, hydration, or home remedies.  Symptoms are: gradually worsening.  Triage Disposition: Go to ED Now (or PCP Triage)  Patient/caregiver understands and will follow disposition?: No, wishes to speak with PCP                Copied from CRM #[id number]. Topic: Clinical - Red Word Triage >> Dec 29, 2024  9:45 AM Antwanette L wrote: Red Word that prompted transfer to Nurse Triage: Patient reports feeling off balance, and when resting or sitting up she feels as though she may pass out. She describes her head as feeling funny. Patient states her blood pressure and blood sugar readings are normal. She reports that the symptoms are difficult to describe Reason for Disposition  Patient sounds very sick or weak to the triager  Answer Assessment - Initial Assessment Questions Recommended ED. Patient would like to see PCP or other provider due to sx not as severe as Saturday. Recommended if sx worsen call 911. CAL notified.         1. DESCRIPTION: Describe your dizziness.     Lightheaded  2. LIGHTHEADED: Do you feel lightheaded? (e.g., somewhat faint, woozy, weak upon standing)     Nausea at times,  3. VERTIGO: Do you feel like either you or the room is spinning or tilting? (i.e., vertigo)     No  4. SEVERITY: How bad is it?  Do you feel like you are going to faint? Can you stand and walk?     Feels like going to faint at times and happening more often  5. ONSET:  When did the dizziness begin?     1-2 weeks ago  6. AGGRAVATING  FACTORS: Does anything make it worse? (e.g., standing, change in head position)     Sitting and standing  7. HEART RATE: Can you tell me your heart rate? How many beats in 15 seconds?  (Note: Not all patients can do this.)       na 8. CAUSE: What do you think is causing the dizziness? (e.g., decreased fluids or food, diarrhea, emotional distress, heat exposure, new medicine, sudden standing, vomiting; unknown)     Not sure  9. RECURRENT SYMPTOM: Have you had dizziness before? If Yes, ask: When was the last time? What happened that time?     Not like this  10. OTHER SYMPTOMS: Do you have any other symptoms? (e.g., fever, chest pain, vomiting, diarrhea, bleeding)       Dizziness at times. Feels funny. Can walk, feels like passing out at times. Sensation is lasting longer at times. Saturday night BP 128/70 and blood sugar 104 at 1230 am when feeling of passing out noted and face feeling funny. No numbness / tingling but felt funny. Blurred vision last week, thought was glasses. Pain in back at times but no chest pain . Head feels tight, full. No c/o sinus pain no runny nose no difficulty breathing no fever. No weakness on either side of body. 11. PREGNANCY: Is there any chance  you are pregnant? When was your last menstrual period?       na  Protocols used: Dizziness - Lightheadedness-A-AH

## 2024-12-29 NOTE — Telephone Encounter (Signed)
 Agree with our triage team.   Can we call to check on her 01/13?

## 2024-12-30 NOTE — Telephone Encounter (Signed)
 Spoke with pt asking how she's doing. States she's better but head pressure goes and comes. Still c/o HA, but not as bad. Pt states she did not go to ER due to concerns about so many other sick people.

## 2024-12-30 NOTE — Telephone Encounter (Signed)
 Added from my chart message sent today:  Good morning & Happy New Year Mallie,  I hope youre doing well! On yesterday I contacted the office to make an appointment and spoke with a nurse who was going to send the information to your nurse for response.  Im following up because I didnt receive any feedback.    Kind regards  Glenette Bookwalter

## 2024-12-30 NOTE — Telephone Encounter (Signed)
Noted, see my chart message. 

## 2024-12-30 NOTE — Telephone Encounter (Signed)
 In order to reduce multiple messages going to provider and team I have added this to open triage note.  No further action needed at this time.

## 2025-01-07 ENCOUNTER — Other Ambulatory Visit: Payer: Self-pay

## 2025-01-07 ENCOUNTER — Emergency Department (HOSPITAL_BASED_OUTPATIENT_CLINIC_OR_DEPARTMENT_OTHER): Payer: Self-pay

## 2025-01-07 ENCOUNTER — Emergency Department (HOSPITAL_BASED_OUTPATIENT_CLINIC_OR_DEPARTMENT_OTHER)
Admission: EM | Admit: 2025-01-07 | Discharge: 2025-01-07 | Disposition: A | Discharge status: Home / Self Care | Payer: Self-pay | Attending: Emergency Medicine | Admitting: Emergency Medicine

## 2025-01-07 ENCOUNTER — Encounter (HOSPITAL_BASED_OUTPATIENT_CLINIC_OR_DEPARTMENT_OTHER): Payer: Self-pay

## 2025-01-07 DIAGNOSIS — R202 Paresthesia of skin: Secondary | ICD-10-CM | POA: Insufficient documentation

## 2025-01-07 DIAGNOSIS — R079 Chest pain, unspecified: Secondary | ICD-10-CM | POA: Insufficient documentation

## 2025-01-07 DIAGNOSIS — Z7982 Long term (current) use of aspirin: Secondary | ICD-10-CM | POA: Insufficient documentation

## 2025-01-07 DIAGNOSIS — R519 Headache, unspecified: Secondary | ICD-10-CM | POA: Insufficient documentation

## 2025-01-07 DIAGNOSIS — Z79899 Other long term (current) drug therapy: Secondary | ICD-10-CM | POA: Insufficient documentation

## 2025-01-07 DIAGNOSIS — I10 Essential (primary) hypertension: Secondary | ICD-10-CM | POA: Insufficient documentation

## 2025-01-07 DIAGNOSIS — Z91040 Latex allergy status: Secondary | ICD-10-CM | POA: Insufficient documentation

## 2025-01-07 DIAGNOSIS — R42 Dizziness and giddiness: Secondary | ICD-10-CM | POA: Insufficient documentation

## 2025-01-07 LAB — COMPREHENSIVE METABOLIC PANEL WITH GFR
ALT: 24 U/L (ref 0–44)
AST: 22 U/L (ref 15–41)
Albumin: 4.4 g/dL (ref 3.5–5.0)
Alkaline Phosphatase: 92 U/L (ref 38–126)
Anion gap: 11 (ref 5–15)
BUN: 15 mg/dL (ref 8–23)
CO2: 25 mmol/L (ref 22–32)
Calcium: 9.5 mg/dL (ref 8.9–10.3)
Chloride: 104 mmol/L (ref 98–111)
Creatinine, Ser: 1.03 mg/dL — ABNORMAL HIGH (ref 0.44–1.00)
GFR, Estimated: >60 mL/min
Glucose, Bld: 118 mg/dL — ABNORMAL HIGH (ref 70–99)
Potassium: 3.9 mmol/L (ref 3.5–5.1)
Sodium: 140 mmol/L (ref 135–145)
Total Bilirubin: 0.4 mg/dL (ref 0.0–1.2)
Total Protein: 7.2 g/dL (ref 6.5–8.1)

## 2025-01-07 LAB — CBC WITH DIFFERENTIAL/PLATELET
Abs Immature Granulocytes: 0.01 K/uL (ref 0.00–0.07)
Basophils Absolute: 0 K/uL (ref 0.0–0.1)
Basophils Relative: 1 %
Eosinophils Absolute: 0.1 K/uL (ref 0.0–0.5)
Eosinophils Relative: 2 %
HCT: 38.5 % (ref 36.0–46.0)
Hemoglobin: 13.4 g/dL (ref 12.0–15.0)
Immature Granulocytes: 0 %
Lymphocytes Relative: 53 %
Lymphs Abs: 2.5 K/uL (ref 0.7–4.0)
MCH: 31 pg (ref 26.0–34.0)
MCHC: 34.8 g/dL (ref 30.0–36.0)
MCV: 89.1 fL (ref 80.0–100.0)
Monocytes Absolute: 0.4 K/uL (ref 0.1–1.0)
Monocytes Relative: 9 %
Neutro Abs: 1.6 K/uL — ABNORMAL LOW (ref 1.7–7.7)
Neutrophils Relative %: 35 %
Platelets: 265 K/uL (ref 150–400)
RBC: 4.32 MIL/uL (ref 3.87–5.11)
RDW: 13.1 % (ref 11.5–15.5)
WBC: 4.6 K/uL (ref 4.0–10.5)
nRBC: 0 % (ref 0.0–0.2)

## 2025-01-07 LAB — TROPONIN T, HIGH SENSITIVITY: Troponin T High Sensitivity: <6 ng/L (ref 0–19)

## 2025-01-07 LAB — D-DIMER, QUANTITATIVE: D-Dimer, Quant: 0.27 ug{FEU}/mL (ref 0.00–0.50)

## 2025-01-07 NOTE — ED Notes (Signed)
 CCMD called pt put on monitor

## 2025-01-07 NOTE — Discharge Instructions (Signed)
 You were seen in the Emergency Department for dizziness facial numbness and chest pain There is no evidence of stroke heart attack or blood clot in your lungs Your workup did not show any concerning findings We have provided you with a neurology referral to be seen in the office by neurologist to discuss ongoing facial numbness Return to the emerged dep for severe headaches worsening symptoms or any other concerns

## 2025-01-07 NOTE — ED Triage Notes (Signed)
 Reports left sided CP that rads down left arm and upwards into face.  Headache. Dizziness.

## 2025-01-07 NOTE — ED Provider Notes (Signed)
 " [facility] [facility] AT [facility] Provider Note   CSN: [id number] Arrival date & time: 01/07/25  1608     Patient presents with: Chest Pain   Samantha Hall is a 64 y.o. female.  With a history of hypertension, osteoarthritis hyperlipidemia and Sjogren syndrome who presents to the ED for chest pain and facial numbness.  Patient reports onset of lightheadedness/dizziness left-sided facial numbness headache and chest pain this afternoon.  She is unable to specify time.  She has had similar episodes in the past and was recently evaluated in her cardiology office [facility]) earlier this month.  Patient has had transient episodes of left-sided facial numbness over the last 2 years.  Symptoms have persisted since the onset.  No nausea vomiting fevers chills or GI symptoms.  No changes in her speech vision or focal weakness      Chest Pain      Prior to Admission medications  Medication Sig Start Date End Date Taking? Authorizing Provider  albuterol  (PROVENTIL ) (2.5 MG/3ML) 0.083% nebulizer solution Take 3 mLs (2.5 mg total) by nebulization every 4 (four) hours as needed for wheezing or shortness of breath. 07/29/21   Rilla Baller, MD  albuterol  (VENTOLIN  HFA) 108 [phone] Base) MCG/ACT inhaler Inhale 2 puffs into the lungs. 05/15/23   [provider]  amLODipine  (NORVASC ) 5 MG tablet Take 5 mg by mouth daily. 05/14/24   [provider]  aspirin 81 MG EC tablet Take by mouth.    [provider]  budesonide  (PULMICORT ) 0.25 MG/2ML nebulizer solution INHALE 1 VIAL VIA BY NEBULIZATION 2 (TWO) TIMES DAILY AS NEEDED 05/15/23   [provider]  Cholecalciferol (VITAMIN D ) 2000 units CAPS Take by mouth.    [provider]  diclofenac  (VOLTAREN ) 75 MG EC tablet TAKE 1 TABLET (75 MG TOTAL) BY MOUTH 2 (TWO) TIMES DAILY AS NEEDED (FOR LEG PAIN). 08/15/23   Clark, Katherine K, NP  EPINEPHrine 0.3 mg/0.3 mL IJ SOAJ injection INJECT 0.3 ML  (0.3 MG TOTAL) INTO THE MUSCLE ONCE AS NEEDED FOR ANAPHYLAXIS FOR UP TO 1 DOSE Patient not taking: Reported on 10/06/2024 05/15/23   [provider]  estradiol  (ESTRACE  VAGINAL) 0.1 MG/GM vaginal cream Insert 1 g vaginally nightly for 1 week, then 1 g weekly as maintenance 06/09/24   Copland, Alicia B, PA-C  fexofenadine  (ALLEGRA ) 180 MG tablet Take 1 tablet (180 mg total) by mouth daily as needed. 08/29/22   Dugal, Tabitha, FNP  Fluocinolone Acetonide Scalp 0.01 % OIL Apply to scalp 3 time per week 05/24/23   [provider]  fluticasone  (FLONASE ) 50 MCG/ACT nasal spray SPRAY 2 SPRAYS INTO EACH NOSTRIL EVERY DAY Patient not taking: Reported on 10/06/2024 05/15/23   Corwin Antu, FNP  Fluticasone -Umeclidin-Vilant (TRELEGY ELLIPTA) 100-62.5-25 MCG/ACT AEPB Inhale into the lungs.    [provider]  montelukast (SINGULAIR) 10 MG tablet Take 10 mg by mouth at bedtime. 10/12/21   [provider]  Nebulizers (COMPRESSOR/NEBULIZER) MISC 1 Units by Does not apply route every 4 (four) hours as needed. 06/18/21   Sung, Jade J, MD  omeprazole  (PRILOSEC) 20 MG capsule Take 1 capsule (20 mg total) by mouth daily. For heartburn Patient taking differently: Take 20 mg by mouth daily as needed. For heartburn 05/04/22   Clark, Katherine K, NP  predniSONE  (DELTASONE ) 20 MG tablet Take 1 tablet (20 mg total) by mouth daily with breakfast. 08/09/22   Tower, Laine LABOR, MD  predniSONE  (DELTASONE ) 5 MG tablet Take 5 mg  by mouth daily.    [provider]  Spacer/Aero-Holding Raguel FRENCH Use as directed with albuterol  inhaler 07/29/21   Rilla Baller, MD  triamcinolone  cream (KENALOG ) 0.5 % APPLY TO AFFECTED AREA TWICE A DAY 08/06/19   Clark, Katherine K, NP  TURMERIC PO Take 4 capsules by mouth daily.    [provider]  valACYclovir  (VALTREX ) 500 MG tablet Take 1 tablet by mouth twice daily x3 days as needed Patient not taking: Reported on 10/06/2024 05/14/23   Clark,  Katherine K, NP    Allergies: Cefazolin, Lidocaine, Losartan , Latex, Other, and Sulfa antibiotics    Review of Systems  Cardiovascular:  Positive for chest pain.    Updated Vital Signs BP (!) 142/80   Pulse (!) 56   Temp 98 F (36.7 C)   Resp 19   SpO2 100%   Physical Exam Vitals and nursing note reviewed.  HENT:     Head: Normocephalic and atraumatic.  Eyes:     Pupils: Pupils are equal, round, and reactive to light.  Cardiovascular:     Rate and Rhythm: Normal rate and regular rhythm.  Pulmonary:     Effort: Pulmonary effort is normal.     Breath sounds: Normal breath sounds.  Abdominal:     Palpations: Abdomen is soft.     Tenderness: There is no abdominal tenderness.  Skin:    General: Skin is warm and dry.  Neurological:     Mental Status: She is alert and oriented to person, place, and time.     Comments: 5 out of 5 motor strength bilateral upper and lower extremities Diminished sensation to light touch along left face Clear fluent speech Vision grossly normal  Psychiatric:        Mood and Affect: Mood normal.     (all labs ordered are listed, but only abnormal results are displayed) Labs Reviewed  COMPREHENSIVE METABOLIC PANEL WITH GFR - Abnormal; Notable for the following components:      Result Value   Glucose, Bld 118 (*)    Creatinine, Ser 1.03 (*)    All other components within normal limits  CBC WITH DIFFERENTIAL/PLATELET - Abnormal; Notable for the following components:   Neutro Abs 1.6 (*)    All other components within normal limits  D-DIMER, QUANTITATIVE  TROPONIN T, HIGH SENSITIVITY  TROPONIN T, HIGH SENSITIVITY    EKG: EKG Interpretation Date/Time:  Wednesday January 07 2025 16:18:48 EST Ventricular Rate:  61 PR Interval:  168 QRS Duration:  80 QT Interval:  402 QTC Calculation: 404 R Axis:   78  Text Interpretation: Normal sinus rhythm Normal ECG When compared with ECG of 17-Jun-2021 17:39, No significant change was found  Confirmed by Pamella Sharper [phone]) on 01/07/2025 4:39:27 PM  Radiology: CT Head Wo Contrast Result Date: 01/07/2025 EXAM: CT HEAD WITHOUT CONTRAST 01/07/2025 06:06:14 PM TECHNIQUE: CT of the head was performed without the administration of intravenous contrast. Automated exposure control, iterative reconstruction, and/or weight based adjustment of the mA/kV was utilized to reduce the radiation dose to as low as reasonably achievable. COMPARISON: None available. CLINICAL HISTORY: L face numbness FINDINGS: BRAIN AND VENTRICLES: No acute hemorrhage. No evidence of acute infarct. No hydrocephalus. No extra-axial collection. No mass effect or midline shift. ORBITS: No acute abnormality. SINUSES: No acute abnormality. SOFT TISSUES AND SKULL: No acute soft tissue abnormality. No skull fracture. IMPRESSION: 1. No acute intracranial abnormality. Electronically signed by: Donnice Mania MD 01/07/2025 06:12 PM EST RP Workstation: [id number]   DG  Chest Portable 1 View Result Date: 01/07/2025 EXAM: 1 VIEW(S) XRAY OF THE CHEST 01/07/2025 05:20:00 PM COMPARISON: 08/09/2022. CLINICAL HISTORY: cp cp cp FINDINGS: LUNGS AND PLEURA: No focal pulmonary opacity. No pleural effusion. No pneumothorax. HEART AND MEDIASTINUM: No acute abnormality of the cardiac and mediastinal silhouettes. BONES AND SOFT TISSUES: No acute osseous abnormality. IMPRESSION: 1. No acute process. Electronically signed by: Franky Crease MD 01/07/2025 05:25 PM EST RP Workstation: [id number]     Procedures   Medications Ordered in the ED - No data to display  Clinical Course as of 01/07/25 1928  Wed Jan 07, 2025  1925 CT head unremarkable chest x-ray clear EKG without evidence of dysrhythmia or ischemic changes.  Troponin negative not consistent with ACS D-dimer negative not consistent with PE.  Remainder of laboratory workup completely unremarkable.  Patient now reports that she is having some numbness in the right side of her face in addition to the  left side.  This does not fit with stroke pattern.  She does tell me she has had similar episodes of since facial numbness that have come and gone over the last few years.  Low suspicion for acute stroke.  No need for emergent MRI but will provided with neurology follow-up for further evaluation.  Return precautions will be worrisome for acute stroke were discussed in detail with her and her husband [MP]    Clinical Course User Index [MP] Pamella Ozell LABOR, DO                                  Medical Decision Making 64 year old female with history as above presented to the ED for constellation of symptoms that started sometime this afternoon.  Unable to specify what time these began.  Symptoms include left-sided facial numbness left-sided chest pain with radiation to the left upper extremity headaches dizziness lightheadedness.  Recent cardiac evaluation earlier this month in the office was negative for acute workup and there is a plan for follow-up with cardiology in 6 months.  On my assessment she does report some diminished sensation to light touch over the left face.  No other pertinent findings on my physical or neurologic exam.  Will obtain CT head without contrast to look for evidence of stroke but we did discuss the need for MRI if her symptoms persist to definitively rule out stroke however patient is hesitant as she is claustrophobic and does not want the MRI.  Also will evaluate for ACS dysrhythmia PE electrolyte imbalance anemia with labs chest x-ray.  No urinary symptoms no need for UA.  Amount and/or Complexity of Data Reviewed Labs: ordered. Radiology: ordered.        Final diagnoses:  Chest pain, unspecified type  Facial paresthesia    ED Discharge Orders          Ordered    Ambulatory referral to Neurology       Comments: An appointment is requested in approximately: 2 weeks   01/07/25 1927               Pamella Ozell LABOR, DO 01/07/25 1928  "

## 2025-01-08 ENCOUNTER — Encounter: Payer: Self-pay | Admitting: Primary Care

## 2025-01-08 ENCOUNTER — Ambulatory Visit: Payer: Self-pay | Admitting: Primary Care

## 2025-01-08 VITALS — BP 118/64 | HR 82 | Temp 98.1°F | Ht 65.0 in | Wt 170.0 lb

## 2025-01-08 DIAGNOSIS — R7303 Prediabetes: Secondary | ICD-10-CM

## 2025-01-08 DIAGNOSIS — R202 Paresthesia of skin: Secondary | ICD-10-CM

## 2025-01-08 DIAGNOSIS — G4452 New daily persistent headache (NDPH): Secondary | ICD-10-CM

## 2025-01-08 DIAGNOSIS — R42 Dizziness and giddiness: Secondary | ICD-10-CM

## 2025-01-08 LAB — VITAMIN B12: Vitamin B-12: 431 pg/mL (ref 211–911)

## 2025-01-08 LAB — HEMOGLOBIN A1C: Hgb A1c MFr Bld: 6.1 % (ref 4.6–6.5)

## 2025-01-08 MED ORDER — SUMATRIPTAN SUCCINATE 25 MG PO TABS: ORAL_TABLET | ORAL | 0 refills | Status: AC

## 2025-01-08 NOTE — Assessment & Plan Note (Signed)
 Neuro exam today reassuring  Will refer to neurology.  Offered to update MRI brain for which she kindly declines. Reviewed neurology notes from January 2023 through [facility]

## 2025-01-08 NOTE — Assessment & Plan Note (Signed)
 Chronic, ongoing  Offered to update MRI brain for which she kindly declined. Referral placed to neurology.  Labs pending today A1c and B12

## 2025-01-08 NOTE — Patient Instructions (Addendum)
 Stop by the lab prior to leaving today. I will notify you of your results once received.   You will either be contacted via phone regarding your referral to neurology, or you may receive a letter on your MyChart portal from our referral team with instructions for scheduling an appointment. Please let us  know if you have not been contacted by anyone within two weeks.  Try the Epley's maneuvers for dizziness. You can also try meclizine 12.5 mg tablets as needed for vertigo.  You can find these over-the-counter.  Take the sumatriptan  (Imitrex ) 25 mg tablets for headaches.  Take 1 tablet by mouth at headache/migraine onset.  You may take a second tablet 2 hours later if needed.  This may cause drowsiness.  It was a pleasure to see you today!

## 2025-01-08 NOTE — Progress Notes (Signed)
 "  Subjective:    Patient ID: Samantha Hall, female    DOB: 03/03/1961, 64 y.o.   MRN: [id number]  Samantha Hall is a very pleasant 64 y.o. female with a history of hypertension, OSA, allergic rhinitis, Sjogren syndrome who presents today to discuss dizziness.  Symptom onset approximately 2 weeks ago with lightheadedness and head pressure.  Evaluated at [facility] yesterday for lightheaded/dizziness, left and right-sided facial numbness, headache, chest pain that began that afternoon.  She also endorsed intermittent left-sided facial numbness over the last 2 years.  She does follow with cardiology.  During her stay in the ED she underwent CT head and chest x-ray for which were both unremarkable.  Her EKG was without evidence of acute ischemia and troponins were negative.  Her D-dimer was not consistent for PE.  Suspicion was low for stroke based on HPI and exam.  She was discharged home later that day with recommendations for neurology evaluation and MRI.  Since her ED stay she continues to experience intermittent symptoms above with the addition of imbalance. She's also experienced occipital and parietal lobe headaches and an odd sensation for the last 1 month. Over the last week her symptoms improved until three days ago. Laying down a few nights ago she felt the room spinning.   She is working to get connected with a new neurologist. She does have a history of migraines, has not required treatment for years. Her headaches now do not feel like her prior migraines.   A1c in October 2025 was 6.4.  She has a history of low vitamin B12 which has not been checked since March 2024.  She has followed with neurology previously and according to notes underwent MRI brain in 2023 for which was negative.  We do not have those results.  She would like to know her blood type  Review of Systems  Respiratory:  Negative for shortness of breath.   Cardiovascular:  Negative for chest pain.   Neurological:  Positive for light-headedness, numbness and headaches.         Past Medical History:  Diagnosis Date   Elevated antinuclear antibody (ANA) level    Negative workup for Lupus.   Fibroids    Fracture of left patella 04/30/2018   Hyperlipidemia    Hypertension    Controlled   Osteoarthritis    Pain of upper abdomen 12/27/2018   Sciatica    Vitamin D  deficiency     Social History   Socioeconomic History   Marital status: Married    Spouse name: Not on file   Number of children: Not on file   Years of education: Not on file   Highest education level: Not on file  Occupational History   Not on file  Tobacco Use   Smoking status: Never   Smokeless tobacco: Never  Vaping Use   Vaping status: Never Used  Substance and Sexual Activity   Alcohol use: Not Currently   Drug use: No   Sexual activity: Yes    Birth control/protection: Surgical    Comment: Hysterectomy  Other Topics Concern   Not on file  Social History Narrative   Married.   Highest level of education Bachelors.    Works as a child psychotherapist.   Social Drivers of Health   Tobacco Use: Low Risk (01/08/2025)   Patient History    Smoking Tobacco Use: Never    Smokeless Tobacco Use: Never    Passive Exposure: Not on file  Financial Resource  Strain: Not on file  Food Insecurity: Not on file  Transportation Needs: Not on file  Physical Activity: Not on file  Stress: Not on file  Social Connections: Unknown (05/01/2022)   Received from [facility]   Social Network    Social Network: Not on file  Intimate Partner Violence: Unknown (03/23/2022)   Received from [facility]   HITS    Physically Hurt: Not on file    Insult or Talk Down To: Not on file    Threaten Physical Harm: Not on file    Scream or Curse: Not on file  Depression (PHQ2-9): Low Risk (01/08/2025)   Depression (PHQ2-9)    PHQ-2 Score: 0  Alcohol Screen: Not on file  Housing: Unknown (01/03/2024)   Received from [facility]   Epic    Unable to Pay for Housing in the Last Year: Not on file    Number of Times Moved in the Last Year: Not on file    At any time in the past 12 months, were you homeless or living in a shelter (including now)?: No  Utilities: Not on file  Health Literacy: Not on file    Past Surgical History:  Procedure Laterality Date   ABDOMINAL HYSTERECTOMY  12/18/2000   CHOLECYSTECTOMY  12/18/2013   OTHER SURGICAL HISTORY     Shoulder 03/2023   TUBAL LIGATION  12/18/1982    Family History  Problem Relation Age of Onset   Diabetes Mother    Heart disease Mother    Heart disease Father     Allergies[1]  Medications Ordered Prior to Encounter[2]  BP 118/64   Pulse 82   Temp 98.1 F (36.7 C) (Oral)   Ht 5' 5 (1.651 m)   Wt 170 lb (77.1 kg)   SpO2 96%   BMI 28.29 kg/m  Objective:   Physical Exam Eyes:     Extraocular Movements: Extraocular movements intact.  Cardiovascular:     Rate and Rhythm: Normal rate and regular rhythm.  Pulmonary:     Effort: Pulmonary effort is normal.     Breath sounds: Normal breath sounds.  Musculoskeletal:     Cervical back: Neck supple.  Skin:    General: Skin is warm and dry.  Neurological:     Mental Status: She is alert and oriented to person, place, and time.     Coordination: Coordination normal.  Psychiatric:        Mood and Affect: Mood normal.     Physical Exam        Assessment & Plan:  Lightheadedness Assessment & Plan: With ED visit. ED notes, labs, imaging reviewed.  Neuro exam today unremarkable.   Unclear etiology. Some HPI does seem to represent vertigo. Discussed this today. Reviewed neurology notes from January 2023 who discuss MRI results.   Will refer to neurology. Also discussed Epley's maneuvers. Handout provided.   A1c and vitamin B12 levels pending   Orders: -     Ambulatory referral to Neurology -     ABO/Rh  New daily persistent headache Assessment & Plan: Neuro  exam today reassuring  Will refer to neurology.  Offered to update MRI brain for which she kindly declines. Reviewed neurology notes from January 2023 through [facility]  Orders: -     Ambulatory referral to Neurology -     SUMAtriptan  Succinate; Take 1 tablet by mouth at migraine onset. May repeat in 2 hours if headache persists or recurs.  Dispense: 10 tablet;  Refill: 0  Paresthesias -     Ambulatory referral to Neurology -     Vitamin B12  Prediabetes Assessment & Plan: Repeat A1c pending  Orders: -     Hemoglobin A1c  Facial paresthesia Assessment & Plan: Chronic, ongoing  Offered to update MRI brain for which she kindly declined. Referral placed to neurology.  Labs pending today A1c and B12     Assessment and Plan Assessment & Plan         Comer MARLA Gaskins, NP       [1]  Allergies Allergen Reactions   Cefazolin Swelling and Anaphylaxis    Difficulty breathing   Lidocaine Swelling   Losartan      Lowers heart rate and causes fatigue   Latex Rash    Contact rash   Other Rash    Contact rash   Sulfa Antibiotics Rash  [2]  Current Outpatient Medications on File Prior to Visit  Medication Sig Dispense Refill   albuterol  (PROVENTIL ) (2.5 MG/3ML) 0.083% nebulizer solution Take 3 mLs (2.5 mg total) by nebulization every 4 (four) hours as needed for wheezing or shortness of breath. 75 mL 1   albuterol  (VENTOLIN  HFA) 108 (90 Base) MCG/ACT inhaler Inhale 2 puffs into the lungs.     amLODipine  (NORVASC ) 5 MG tablet Take 5 mg by mouth daily.     aspirin 81 MG EC tablet Take by mouth.     budesonide  (PULMICORT ) 0.25 MG/2ML nebulizer solution INHALE 1 VIAL VIA BY NEBULIZATION 2 (TWO) TIMES DAILY AS NEEDED     Cholecalciferol (VITAMIN D ) 2000 units CAPS Take by mouth.     diclofenac  (VOLTAREN ) 75 MG EC tablet TAKE 1 TABLET (75 MG TOTAL) BY MOUTH 2 (TWO) TIMES DAILY AS NEEDED (FOR LEG PAIN). 180 tablet 0   estradiol  (ESTRACE  VAGINAL) 0.1 MG/GM vaginal  cream Insert 1 g vaginally nightly for 1 week, then 1 g weekly as maintenance 42.5 g 0   fexofenadine  (ALLEGRA ) 180 MG tablet Take 1 tablet (180 mg total) by mouth daily as needed. 90 tablet 1   Fluocinolone Acetonide Scalp 0.01 % OIL Apply to scalp 3 time per week     fluticasone  (FLONASE ) 50 MCG/ACT nasal spray SPRAY 2 SPRAYS INTO EACH NOSTRIL EVERY DAY 48 mL 2   Fluticasone -Umeclidin-Vilant (TRELEGY ELLIPTA) 100-62.5-25 MCG/ACT AEPB Inhale into the lungs.     montelukast (SINGULAIR) 10 MG tablet Take 10 mg by mouth at bedtime.     Nebulizers (COMPRESSOR/NEBULIZER) MISC 1 Units by Does not apply route every 4 (four) hours as needed. 1 each 0   omeprazole  (PRILOSEC) 20 MG capsule Take 1 capsule (20 mg total) by mouth daily. For heartburn (Patient taking differently: Take 20 mg by mouth daily as needed. For heartburn) 90 capsule 3   predniSONE  (DELTASONE ) 20 MG tablet Take 1 tablet (20 mg total) by mouth daily with breakfast. 7 tablet 0   predniSONE  (DELTASONE ) 5 MG tablet Take 5 mg by mouth daily.     Spacer/Aero-Holding Raguel FRENCH Use as directed with albuterol  inhaler 1 Units 0   triamcinolone  cream (KENALOG ) 0.5 % APPLY TO AFFECTED AREA TWICE A DAY 30 g 0   TURMERIC PO Take 4 capsules by mouth daily.     No current facility-administered medications on file prior to visit.   "

## 2025-01-08 NOTE — Assessment & Plan Note (Signed)
 Repeat A1c pending

## 2025-01-08 NOTE — Assessment & Plan Note (Addendum)
 With ED visit. ED notes, labs, imaging reviewed.  Neuro exam today unremarkable.   Unclear etiology. Some HPI does seem to represent vertigo. Discussed this today. Reviewed neurology notes from January 2023 who discuss MRI results.   Will refer to neurology. Also discussed Epley's maneuvers. Handout provided.   A1c and vitamin B12 levels pending

## 2025-01-09 LAB — ABO/RH: Rh Factor: POSITIVE

## 2025-01-13 ENCOUNTER — Ambulatory Visit: Payer: Self-pay | Admitting: Primary Care
